# Patient Record
Sex: Female | Born: 1950 | Race: White | Hispanic: No | Smoking: Never smoker
Health system: Southern US, Community
[De-identification: ages and names within clinical notes are randomized; demographics above are authoritative.]

## PROBLEM LIST (undated history)

## (undated) DIAGNOSIS — F329 Major depressive disorder, single episode, unspecified: Secondary | ICD-10-CM

## (undated) DIAGNOSIS — IMO0001 Reserved for inherently not codable concepts without codable children: Secondary | ICD-10-CM

## (undated) DIAGNOSIS — H269 Unspecified cataract: Secondary | ICD-10-CM

## (undated) DIAGNOSIS — R351 Nocturia: Secondary | ICD-10-CM

## (undated) DIAGNOSIS — M549 Dorsalgia, unspecified: Secondary | ICD-10-CM

## (undated) DIAGNOSIS — R197 Diarrhea, unspecified: Secondary | ICD-10-CM

## (undated) DIAGNOSIS — G8929 Other chronic pain: Secondary | ICD-10-CM

## (undated) DIAGNOSIS — O24419 Gestational diabetes mellitus in pregnancy, unspecified control: Secondary | ICD-10-CM

## (undated) DIAGNOSIS — G250 Essential tremor: Secondary | ICD-10-CM

## (undated) DIAGNOSIS — I10 Essential (primary) hypertension: Secondary | ICD-10-CM

## (undated) DIAGNOSIS — K5790 Diverticulosis of intestine, part unspecified, without perforation or abscess without bleeding: Secondary | ICD-10-CM

## (undated) DIAGNOSIS — M2550 Pain in unspecified joint: Secondary | ICD-10-CM

## (undated) DIAGNOSIS — H5702 Anisocoria: Secondary | ICD-10-CM

## (undated) DIAGNOSIS — G4730 Sleep apnea, unspecified: Secondary | ICD-10-CM

## (undated) DIAGNOSIS — M1990 Unspecified osteoarthritis, unspecified site: Secondary | ICD-10-CM

## (undated) DIAGNOSIS — R011 Cardiac murmur, unspecified: Secondary | ICD-10-CM

## (undated) DIAGNOSIS — H35039 Hypertensive retinopathy, unspecified eye: Secondary | ICD-10-CM

## (undated) DIAGNOSIS — H3530 Unspecified macular degeneration: Secondary | ICD-10-CM

## (undated) DIAGNOSIS — E785 Hyperlipidemia, unspecified: Secondary | ICD-10-CM

## (undated) DIAGNOSIS — F32A Depression, unspecified: Secondary | ICD-10-CM

## (undated) DIAGNOSIS — H8110 Benign paroxysmal vertigo, unspecified ear: Secondary | ICD-10-CM

## (undated) DIAGNOSIS — M810 Age-related osteoporosis without current pathological fracture: Secondary | ICD-10-CM

## (undated) DIAGNOSIS — T7840XA Allergy, unspecified, initial encounter: Secondary | ICD-10-CM

## (undated) HISTORY — DX: Anisocoria: H57.02

## (undated) HISTORY — PX: COLONOSCOPY: SHX174

## (undated) HISTORY — DX: Essential (primary) hypertension: I10

## (undated) HISTORY — DX: Sleep apnea, unspecified: G47.30

## (undated) HISTORY — PX: TONSILLECTOMY: SUR1361

## (undated) HISTORY — DX: Gilbert syndrome: E80.4

## (undated) HISTORY — DX: Allergy, unspecified, initial encounter: T78.40XA

## (undated) HISTORY — DX: Major depressive disorder, single episode, unspecified: F32.9

## (undated) HISTORY — PX: EYE SURGERY: SHX253

## (undated) HISTORY — DX: Unspecified cataract: H26.9

## (undated) HISTORY — DX: Essential tremor: G25.0

## (undated) HISTORY — DX: Hypertensive retinopathy, unspecified eye: H35.039

## (undated) HISTORY — DX: Cardiac murmur, unspecified: R01.1

## (undated) HISTORY — DX: Age-related osteoporosis without current pathological fracture: M81.0

## (undated) HISTORY — DX: Gestational diabetes mellitus in pregnancy, unspecified control: O24.419

## (undated) HISTORY — PX: DILATION AND CURETTAGE OF UTERUS: SHX78

## (undated) HISTORY — DX: Depression, unspecified: F32.A

## (undated) HISTORY — DX: Hyperlipidemia, unspecified: E78.5

## (undated) HISTORY — DX: Benign paroxysmal vertigo, unspecified ear: H81.10

## (undated) HISTORY — PX: PLANTAR FASCIA SURGERY: SHX746

---

## 1998-06-21 ENCOUNTER — Other Ambulatory Visit: Admission: RE | Admit: 1998-06-21 | Discharge: 1998-06-21 | Payer: Self-pay | Admitting: Obstetrics and Gynecology

## 2000-10-16 ENCOUNTER — Other Ambulatory Visit: Admission: RE | Admit: 2000-10-16 | Discharge: 2000-10-16 | Payer: Self-pay | Admitting: *Deleted

## 2001-03-19 ENCOUNTER — Other Ambulatory Visit: Admission: RE | Admit: 2001-03-19 | Discharge: 2001-03-19 | Payer: Self-pay | Admitting: *Deleted

## 2001-04-22 ENCOUNTER — Other Ambulatory Visit: Admission: RE | Admit: 2001-04-22 | Discharge: 2001-04-22 | Payer: Self-pay | Admitting: *Deleted

## 2001-04-22 ENCOUNTER — Encounter (INDEPENDENT_AMBULATORY_CARE_PROVIDER_SITE_OTHER): Payer: Self-pay | Admitting: Specialist

## 2002-07-17 ENCOUNTER — Other Ambulatory Visit: Admission: RE | Admit: 2002-07-17 | Discharge: 2002-07-17 | Payer: Self-pay | Admitting: Obstetrics and Gynecology

## 2002-10-08 ENCOUNTER — Ambulatory Visit (HOSPITAL_COMMUNITY): Admission: RE | Admit: 2002-10-08 | Discharge: 2002-10-08 | Payer: Self-pay | Admitting: Obstetrics and Gynecology

## 2002-10-08 ENCOUNTER — Encounter (INDEPENDENT_AMBULATORY_CARE_PROVIDER_SITE_OTHER): Payer: Self-pay | Admitting: Specialist

## 2003-08-24 ENCOUNTER — Other Ambulatory Visit: Admission: RE | Admit: 2003-08-24 | Discharge: 2003-08-24 | Payer: Self-pay | Admitting: Obstetrics and Gynecology

## 2004-01-18 ENCOUNTER — Encounter (INDEPENDENT_AMBULATORY_CARE_PROVIDER_SITE_OTHER): Payer: Self-pay | Admitting: *Deleted

## 2004-05-08 ENCOUNTER — Encounter: Payer: Self-pay | Admitting: Internal Medicine

## 2004-11-10 ENCOUNTER — Ambulatory Visit: Payer: Self-pay | Admitting: Internal Medicine

## 2005-07-11 ENCOUNTER — Ambulatory Visit: Payer: Self-pay | Admitting: Internal Medicine

## 2005-07-17 ENCOUNTER — Ambulatory Visit: Payer: Self-pay

## 2006-07-17 ENCOUNTER — Ambulatory Visit: Payer: Self-pay | Admitting: Internal Medicine

## 2006-09-18 ENCOUNTER — Ambulatory Visit: Payer: Self-pay | Admitting: Internal Medicine

## 2006-10-09 ENCOUNTER — Ambulatory Visit: Payer: Self-pay | Admitting: Internal Medicine

## 2006-10-14 ENCOUNTER — Ambulatory Visit: Payer: Self-pay | Admitting: Internal Medicine

## 2006-10-21 ENCOUNTER — Ambulatory Visit: Payer: Self-pay | Admitting: Internal Medicine

## 2006-11-18 ENCOUNTER — Ambulatory Visit: Payer: Self-pay | Admitting: Internal Medicine

## 2006-11-18 LAB — CONVERTED CEMR LAB
ALT: 31 units/L (ref 0–40)
AST: 30 units/L (ref 0–37)
Chol/HDL Ratio, serum: 2.7
HDL: 67.1 mg/dL (ref >39.0)
Hgb A1c MFr Bld: 5.9 % (ref 4.6–6.0)
LDL Cholesterol: 94 mg/dL (ref 0–99)
VLDL: 21 mg/dL (ref 0–40)

## 2007-09-09 ENCOUNTER — Ambulatory Visit: Payer: Self-pay | Admitting: Internal Medicine

## 2007-09-09 DIAGNOSIS — E785 Hyperlipidemia, unspecified: Secondary | ICD-10-CM

## 2007-09-09 DIAGNOSIS — E8881 Metabolic syndrome: Secondary | ICD-10-CM | POA: Insufficient documentation

## 2007-09-09 DIAGNOSIS — F329 Major depressive disorder, single episode, unspecified: Secondary | ICD-10-CM

## 2007-09-09 DIAGNOSIS — F32A Depression, unspecified: Secondary | ICD-10-CM | POA: Insufficient documentation

## 2007-09-09 DIAGNOSIS — I10 Essential (primary) hypertension: Secondary | ICD-10-CM

## 2007-09-09 LAB — CONVERTED CEMR LAB
Cholesterol, target level: 200 mg/dL
HDL goal, serum: 40 mg/dL
LDL Goal: 160 mg/dL

## 2007-09-19 ENCOUNTER — Ambulatory Visit: Payer: Self-pay | Admitting: Internal Medicine

## 2007-09-22 ENCOUNTER — Encounter (INDEPENDENT_AMBULATORY_CARE_PROVIDER_SITE_OTHER): Payer: Self-pay | Admitting: *Deleted

## 2007-09-22 LAB — CONVERTED CEMR LAB
ALT: 23 units/L (ref 0–35)
AST: 21 units/L (ref 0–37)
Albumin: 4 g/dL (ref 3.5–5.2)
Alkaline Phosphatase: 112 units/L (ref 39–117)
Basophils Absolute: 0 10*3/uL (ref 0.0–0.1)
Basophils Relative: 0.1 % (ref 0.0–1.0)
Bilirubin, Direct: 0.1 mg/dL (ref 0.0–0.3)
Cholesterol: 231 mg/dL (ref 0–200)
Creatinine, Ser: 0.7 mg/dL (ref 0.4–1.2)
Direct LDL: 154.8 mg/dL
Eosinophils Absolute: 0.2 10*3/uL (ref 0.0–0.6)
Eosinophils Relative: 4 % (ref 0.0–5.0)
GFR calc non Af Amer: 92 mL/min
HCT: 39 % (ref 36.0–46.0)
Hgb A1c MFr Bld: 6.1 % — ABNORMAL HIGH (ref 4.6–6.0)
MCHC: 33.9 g/dL (ref 30.0–36.0)
MCV: 84.3 fL (ref 78.0–100.0)
Monocytes Absolute: 0.3 10*3/uL (ref 0.2–0.7)
Monocytes Relative: 5.3 % (ref 3.0–11.0)
Neutro Abs: 4.2 10*3/uL (ref 1.4–7.7)
Platelets: 246 10*3/uL (ref 150–400)
RBC: 4.63 M/uL (ref 3.87–5.11)
Sodium: 145 meq/L (ref 135–145)
TSH: 1.03 microintl units/mL (ref 0.35–5.50)
Total Bilirubin: 0.9 mg/dL (ref 0.3–1.2)
Total CHOL/HDL Ratio: 4.2

## 2008-10-06 ENCOUNTER — Encounter (INDEPENDENT_AMBULATORY_CARE_PROVIDER_SITE_OTHER): Payer: Self-pay | Admitting: *Deleted

## 2009-01-26 ENCOUNTER — Ambulatory Visit: Payer: Self-pay | Admitting: Internal Medicine

## 2009-01-26 LAB — CONVERTED CEMR LAB: Cholesterol, target level: 200 mg/dL

## 2009-01-27 ENCOUNTER — Telehealth (INDEPENDENT_AMBULATORY_CARE_PROVIDER_SITE_OTHER): Payer: Self-pay | Admitting: *Deleted

## 2009-01-27 ENCOUNTER — Encounter: Payer: Self-pay | Admitting: Internal Medicine

## 2009-01-27 LAB — CONVERTED CEMR LAB
Albumin: 4.1 g/dL (ref 3.5–5.2)
Basophils Absolute: 0.1 10*3/uL (ref 0.0–0.1)
Calcium: 9.6 mg/dL (ref 8.4–10.5)
Chloride: 101 meq/L (ref 96–112)
Cholesterol: 263 mg/dL (ref 0–200)
Creatinine, Ser: 0.6 mg/dL (ref 0.4–1.2)
Direct LDL: 181 mg/dL
Eosinophils Absolute: 0.3 10*3/uL (ref 0.0–0.7)
Eosinophils Relative: 3.8 % (ref 0.0–5.0)
Glucose, Bld: 104 mg/dL — ABNORMAL HIGH (ref 70–99)
HCT: 42.3 % (ref 36.0–46.0)
HDL: 65.3 mg/dL (ref >39.0)
Hemoglobin: 14 g/dL (ref 12.0–15.0)
Hgb A1c MFr Bld: 6.3 % — ABNORMAL HIGH (ref 4.6–6.0)
MCHC: 33.1 g/dL (ref 30.0–36.0)
Microalb, Ur: 0.9 mg/dL (ref 0.0–1.9)
Monocytes Absolute: 0.4 10*3/uL (ref 0.1–1.0)
Monocytes Relative: 5.5 % (ref 3.0–12.0)
Potassium: 5.1 meq/L (ref 3.5–5.1)
RBC: 5 M/uL (ref 3.87–5.11)
Sodium: 141 meq/L (ref 135–145)
TSH: 0.9 microintl units/mL (ref 0.35–5.50)
Total Protein: 7.8 g/dL (ref 6.0–8.3)
Triglycerides: 127 mg/dL (ref 0–149)

## 2009-04-08 ENCOUNTER — Ambulatory Visit: Payer: Self-pay | Admitting: Internal Medicine

## 2009-04-10 LAB — CONVERTED CEMR LAB
ALT: 29 units/L (ref 0–35)
Alkaline Phosphatase: 111 units/L (ref 39–117)
Bilirubin, Direct: 0 mg/dL (ref 0.0–0.3)
Cholesterol: 205 mg/dL — ABNORMAL HIGH (ref 0–200)
HDL: 64.7 mg/dL (ref >39.00)
Microalb Creat Ratio: 6.4 mg/g (ref 0.0–30.0)
Microalb, Ur: 0.8 mg/dL (ref 0.0–1.9)
Triglycerides: 145 mg/dL (ref 0.0–149.0)
VLDL: 29 mg/dL (ref 0.0–40.0)

## 2009-04-11 ENCOUNTER — Encounter (INDEPENDENT_AMBULATORY_CARE_PROVIDER_SITE_OTHER): Payer: Self-pay | Admitting: *Deleted

## 2009-04-15 ENCOUNTER — Encounter: Payer: Self-pay | Admitting: Internal Medicine

## 2009-12-05 ENCOUNTER — Encounter (INDEPENDENT_AMBULATORY_CARE_PROVIDER_SITE_OTHER): Payer: Self-pay | Admitting: *Deleted

## 2010-01-21 ENCOUNTER — Encounter: Payer: Self-pay | Admitting: Internal Medicine

## 2010-03-15 ENCOUNTER — Ambulatory Visit: Payer: Self-pay | Admitting: Internal Medicine

## 2010-03-15 DIAGNOSIS — J309 Allergic rhinitis, unspecified: Secondary | ICD-10-CM | POA: Insufficient documentation

## 2010-03-20 LAB — CONVERTED CEMR LAB
Albumin: 4.1 g/dL (ref 3.5–5.2)
Alkaline Phosphatase: 97 units/L (ref 39–117)
Basophils Relative: 0.9 % (ref 0.0–3.0)
Bilirubin, Direct: 0.1 mg/dL (ref 0.0–0.3)
Calcium: 9.2 mg/dL (ref 8.4–10.5)
Chloride: 105 meq/L (ref 96–112)
Creatinine, Ser: 0.6 mg/dL (ref 0.4–1.2)
Glucose, Bld: 102 mg/dL — ABNORMAL HIGH (ref 70–99)
HCT: 38.7 % (ref 36.0–46.0)
HDL: 77.4 mg/dL (ref >39.00)
Hgb A1c MFr Bld: 6.1 % (ref 4.6–6.5)
Lymphs Abs: 1.1 10*3/uL (ref 0.7–4.0)
MCV: 86 fL (ref 78.0–100.0)
Monocytes Absolute: 0.3 10*3/uL (ref 0.1–1.0)
Monocytes Relative: 4.9 % (ref 3.0–12.0)
Neutro Abs: 3.7 10*3/uL (ref 1.4–7.7)
Neutrophils Relative %: 68.8 % (ref 43.0–77.0)
Potassium: 4.6 meq/L (ref 3.5–5.1)
RDW: 14.9 % — ABNORMAL HIGH (ref 11.5–14.6)
Sodium: 142 meq/L (ref 135–145)
Total Bilirubin: 0.6 mg/dL (ref 0.3–1.2)
Triglycerides: 122 mg/dL (ref 0.0–149.0)
VLDL: 24.4 mg/dL (ref 0.0–40.0)

## 2010-04-26 ENCOUNTER — Encounter: Payer: Self-pay | Admitting: Internal Medicine

## 2010-06-19 ENCOUNTER — Encounter: Payer: Self-pay | Admitting: Gastroenterology

## 2010-06-19 ENCOUNTER — Telehealth: Payer: Self-pay | Admitting: Gastroenterology

## 2010-06-23 ENCOUNTER — Encounter (INDEPENDENT_AMBULATORY_CARE_PROVIDER_SITE_OTHER): Payer: Self-pay | Admitting: *Deleted

## 2010-06-26 ENCOUNTER — Ambulatory Visit: Payer: Self-pay | Admitting: Gastroenterology

## 2010-07-10 ENCOUNTER — Ambulatory Visit: Payer: Self-pay | Admitting: Gastroenterology

## 2010-07-10 LAB — HM COLONOSCOPY

## 2010-12-20 ENCOUNTER — Encounter
Admission: RE | Admit: 2010-12-20 | Discharge: 2010-12-20 | Payer: Self-pay | Source: Home / Self Care | Attending: Orthopedic Surgery | Admitting: Orthopedic Surgery

## 2011-01-30 NOTE — Assessment & Plan Note (Signed)
Summary: cpx/ns/kdc   Vital Signs:  Patient profile:   61 year old female Height:      61.75 inches Weight:      245.6 pounds BMI:     45.45 Temp:     98.2 degrees F oral Pulse rate:   67 / minute Resp:     14 per minute BP sitting:   126 / 80  (left arm) Cuff size:   large  Vitals Entered By: Shonna Chock (March 15, 2010 8:25 AM)  CC: CPX with fasting labs , General Medical Evaluation Comments REVIEWED MED LIST, PATIENT AGREED DOSE AND INSTRUCTION CORRECT    CC:  CPX with fasting labs  and General Medical Evaluation.  History of Present Illness: Mrs. Romo is here for a physical; she is experiencing perennial nasal congestion for > 1 year. 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 Eyes:  Denies blurring, double vision, and vision loss-both eyes. ENT:  Complains of nasal congestion and postnasal drainage; denies ear discharge, earache, and sinus pressure. CV:  Denies chest pain or discomfort, difficulty breathing at night, difficulty breathing while lying down, leg cramps with exertion, palpitations, swelling of feet, and swelling of hands; Occasional positional dizziness esp in LLDP in bed; no orthostatic hypotension  symptoms. Resp:  Denies cough, excessive snoring, hypersomnolence, morning headaches, shortness of breath, and sputum productive; Seasonal RAD symptoms with mild wheezing. GI:  Denies abdominal pain, bloody stools, dark tarry stools, and indigestion. GU:  Denies discharge, dysuria, and hematuria. MS:  Complains of joint pain; denies joint redness, joint swelling, low back pain, mid back pain, and thoracic pain; Ankle & R shoulder pain; as needed Meloxicam. Derm:  Complains of lesion(s); denies changes in nail beds, dryness, hair loss, and rash; Scalp lesion X 4 months, enlarging. Neuro:  Denies disturbances in coordination, numbness, and tingling; Intermittent imbalance . Psych:  Denies anxiety, depression, easily angered, easily tearful, and irritability; No issues on Sertraline. Endo:  Denies cold intolerance, excessive hunger, excessive thirst, excessive urination, and heat intolerance. Heme:  Denies abnormal bruising and bleeding. Allergy:  Complains of itching eyes; denies sneezing.  Physical Exam  General:  well-nourished; alert,appropriate and cooperative throughout examination;overweight-appearing.   Head:  Normocephalic and atraumatic without obvious abnormalities. Verrucoid lesion L scalp Eyes:  No corneal or conjunctival inflammation noted. Perrla. Funduscopic exam benign, without hemorrhages, exudates or papilledema.  Ears:  External ear exam shows no significant lesions or deformities.  Otoscopic examination reveals clear canals, tympanic membranes are intact bilaterally without bulging, retraction, inflammation or discharge. Hearing is grossly normal bilaterally. Nose:  External nasal examination shows no deformity or inflammation. Nasal mucosa are pink and moist without lesions or exudates. Mouth:  Oral mucosa and oropharynx without lesions or exudates.  Teeth in good repair. Neck:  No deformities, masses, or tenderness noted. Lungs:  Normal respiratory effort, chest expands  symmetrically. Lungs are clear  to auscultation, no crackles or wheezes. Heart:  normal rate, regular rhythm, no gallop, no rub, no JVD, no HJR, and grade1  /6 systolic murmur.   Abdomen:  Bowel sounds positive,abdomen soft and non-tender without masses, organomegaly or hernias noted. Genitalia:  Dr Rosalio Macadamia Msk:  No deformity or scoliosis noted of thoracic or lumbar spine.   Pulses:  R and L carotid,radial,dorsalis pedis and posterior tibial pulses are full and equal bilaterally Extremities:  No clubbing, cyanosis, edema, or deformity noted with normal full range of motion of all joints.   Neurologic:  alert & oriented X3 and DTRs symmetrical and normal.   Skin:  See HEAD/scalp lesion Cervical Nodes:  No lymphadenopathy noted Axillary Nodes:  No palpable lymphadenopathy Psych:  memory intact for recent and remote, normally interactive, good eye contact, not anxious appearing, and not depressed appearing.     Impression & Recommendations:  Problem # 1:  ROUTINE GENERAL MEDICAL EXAM@HEALTH  CARE FACL (ICD-V70.0)  Orders: EKG w/ Interpretation (93000) Venipuncture (11914) TLB-Lipid Panel (80061-LIPID) TLB-BMP (Basic Metabolic Panel-BMET) (80048-METABOL) TLB-CBC Platelet - w/Differential (85025-CBCD) TLB-Hepatic/Liver Function Pnl (80076-HEPATIC) TLB-TSH (Thyroid Stimulating Hormone) (84443-TSH) TLB-A1C / Hgb A1C (Glycohemoglobin) (83036-A1C)  Problem # 2:  RHINITIS (ICD-477.9)  Orders: Venipuncture (78295)  Her updated medication list for this problem includes:    Fluticasone Propionate 50 Mcg/act Susp (Fluticasone propionate) .Marland Kitchen... 1 spray two times a day  Problem # 3:  HYPERTENSION (ICD-401.9)  controlled Her updated medication list for this problem includes:    Benazepril Hcl 20 Mg Tabs (Benazepril hcl) .Marland Kitchen... Take one tablet daily.    Amlodipine Besylate 5 Mg Tabs (Amlodipine besylate) .Marland Kitchen... Take 1 tab every am  Orders: EKG w/ Interpretation (93000) Venipuncture  (62130)  Problem # 4:  HYPERLIPIDEMIA (ICD-272.4)  Her updated medication list for this problem includes:    Pravastatin Sodium 40 Mg Tabs (Pravastatin sodium) .Marland Kitchen... 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 Take one tablet daily. 2)  Amlodipine Besylate 5 Mg Tabs (Amlodipine besylate) .... Take 1 tab every am 3)  Zoloft 100 Mg Tabs (Sertraline hcl) .... Take one tablet daily 4)  Antivert 12.5 Mg Tabs (Meclizine hcl) .Marland Kitchen.. 1 -2 q 6 hrs prn 5)  Aspirin Adult Low Strength 81 Mg Tbec (Aspirin) .Marland Kitchen.. 1 by mouth m,w,f 6)  Vitamin D 2000 Unit Tabs (Cholecalciferol) .Marland Kitchen.. 1 by mouth once daily 7)  Fish Oil 1200 Mg Caps (Omega-3 fatty acids) .Marland Kitchen.. 1 by mouth once daily 8)  Pravastatin Sodium 40 Mg Tabs (Pravastatin sodium) .Marland Kitchen.. 1 qhs 9)  Fluticasone Propionate 50 Mcg/act Susp (Fluticasone propionate) .Marland Kitchen.. 1 spray two times a day  Patient Instructions: 1)  Flonase two times a day as needed for rhinitis.Less than 40 grams of HFCS "sugar" / day. Prescriptions: FLUTICASONE PROPIONATE 50 MCG/ACT SUSP (FLUTICASONE PROPIONATE) 1 spray two times a day  #1 x prn   Entered and Authorized by:   Marga Melnick MD   Signed by:   Marga Melnick MD on 03/15/2010   Method used:   Historical   RxID:   [id number] PRAVASTATIN SODIUM 40 MG TABS (PRAVASTATIN SODIUM) 1 qhs  #90 Tablet x 3   Entered and Authorized by:   Marga Melnick MD   Signed by:   Marga Melnick MD on 03/15/2010   Method used:  Faxed to ...       [facility] (retail)       [address]       [place], [place]  [address]       Ph: [phone] or [phone]       Fax: [phone]   RxID:   [id number] ZOLOFT 100 MG  TABS (SERTRALINE HCL) TAKE ONE  TABLET DAILY  #90 Tablet x 3   Entered and Authorized by:   Marga Melnick MD   Signed by:   Marga Melnick MD on 03/15/2010   Method used:   Faxed to ...       [facility] (retail)       [address]       [place], [place]  [address]       Ph: [phone] or [phone]       Fax: [phone]   RxID:   [id number] AMLODIPINE BESYLATE 5 MG  TABS (AMLODIPINE BESYLATE) TAKE 1 TAB EVERY AM  #90 Tablet x 3   Entered and Authorized by:   Marga Melnick MD   Signed by:   Marga Melnick MD on 03/15/2010   Method used:   Faxed to ...       [facility] (retail)       [address]       [place], [place]  [address]       Ph: [phone] or [phone]       Fax: [phone]   RxID:   [id number] BENAZEPRIL HCL 20 MG TABS (BENAZEPRIL HCL) Take one tablet daily.  #90 Tablet x 3   Entered and Authorized by:   Marga Melnick MD   Signed by:   Marga Melnick MD on 03/15/2010   Method used:   Faxed to ...       [facility] (retail)       [address]       [place], [place]  [address]       Ph: [phone] or [phone]       Fax: [phone]   RxID:   [id number]

## 2011-01-30 NOTE — Miscellaneous (Signed)
Summary: [facility]  Clinical Lists Changes  Medications: Added new medication of MOVIPREP 100 GM  SOLR (PEG-KCL-NACL-NASULF-NA ASC-C) As per prep instructions. - Signed Rx of MOVIPREP 100 GM  SOLR (PEG-KCL-NACL-NASULF-NA ASC-C) As per prep instructions.;  #1 x 0;  Signed;  Entered by: Ezra Sites RN;  Authorized by: Rachael Fee MD;  Method used: Electronically to [facility] [phone]*, [address] [place], [place], [place]  [address], Ph: [phone] or [phone], Fax: [phone] Observations: Added new observation of NKA: T (06/26/2010 15:40)    Prescriptions: MOVIPREP 100 GM  SOLR (PEG-KCL-NACL-NASULF-NA ASC-C) As per prep instructions.  #1 x 0   Entered by:   Ezra Sites RN   Authorized by:   Rachael Fee MD   Signed by:   Ezra Sites RN on 06/26/2010   Method used:   Electronically to        [facility] (retail)       [address]       [place], [place]  [address]       Ph: [phone] or [phone]       Fax: [phone]   RxID:   [id number]

## 2011-01-30 NOTE — Procedures (Signed)
Summary: Colonoscopy   Colonoscopy  Procedure date:  07/10/2010  Findings:      Location:  [facility].    Procedures Next Due Date:    Colonoscopy: 07/2015

## 2011-01-30 NOTE — Procedures (Signed)
Summary: COLON (Dr Blossom Hoops)   Colonoscopy  Procedure date:  01/18/2004  Findings:      Location:  [facility].    Procedures Next Due Date:    Colonoscopy: 01/2009 Patient Name: Jane Montgomery, Jane Montgomery MRN:  Procedure Procedures: Colonoscopy CPT: 95621.  Personnel: Endoscopist: Ulyess Mort, MD.  Exam Location: Exam performed in Outpatient Clinic. Outpatient  Patient Consent: Procedure, Alternatives, Risks and Benefits discussed, consent obtained, from patient. Consent was obtained by the RN.  Indications  Increased Risk Screening: For family history of colorectal neoplasia, in   History  Current Medications: Patient is not currently taking Coumadin.  Pre-Exam Physical: Entire physical exam was normal.  Exam Exam: Extent of exam reached: Cecum, extent intended: Cecum.  The cecum was identified by appendiceal orifice and IC valve. Colon retroflexion performed. Images were not taken. ASA Classification: II. Tolerance: good.  Monitoring: Pulse and BP monitoring, Oximetry used. Supplemental O2 given.  Colon Prep Prep results: good.  Sedation Meds: Patient assessed and found to be appropriate for moderate (conscious) sedation. Fentanyl 100 mcg. given IV. Versed 10 mg. given IV.  Findings - NOT SEEN ON EXAM: Cecum to Rectum. Polyps, AVM's, Colitis, Tumors, Melanosis, Crohn's, Diverticulosis, Hemorrhoids, Comments: very redundant colon.   Assessment Abnormal examination, see findings above.  Events  Unplanned Interventions: No intervention was required.  Unplanned Events: There were no complications. Plans Medication Plan: Continue current medications.  Patient Education: Patient given standard instructions for: a normal exam. Yearly hemoccult testing recommended. Patient instructed to get routine colonoscopy every 5 years.  Disposition: After procedure patient sent to recovery. After recovery patient sent home.   This report was created from  the original endoscopy report, which was reviewed and signed by the above listed endoscopist.

## 2011-01-30 NOTE — Letter (Signed)
Summary: [facility]   Imported By: Lanelle Bal 05/15/2010 09:08:15  _____________________________________________________________________  External Attachment:    Type:   Image     Comment:   External Document

## 2011-01-30 NOTE — Progress Notes (Signed)
Summary: Schedule Recall Colonosocpy  Phone Note Outgoing Call   Call placed by: Lamona Curl CMA Duncan Dull),  June 19, 2010 4:00 PM Call placed to: Patient Summary of Call: Patient is overdue for colonoscopy. Her last colonoscopy was in 2005 by Dr Victorino Dike and showed a very redundant colon. She also has a very strong family history of colon cancer in her mother, maternal grandmother, Maternal aunt, 3 maternal great aunts. I have spoken to patient who has scheduled a previsit and colonoscopy in July 2011with Dr Christella Hartigan. Initial call taken by: Lamona Curl CMA Duncan Dull),  June 19, 2010 4:09 PM

## 2011-01-30 NOTE — Letter (Signed)
Summary: Previsit letter  [facility]  [address] [place], [place] [address]   Phone: [phone]  Fax: [phone]       06/19/2010 MRN: [id number]  [facility] [address] [place], [place]  [address]  Dear Jane Montgomery,  Welcome to the Gastroenterology Division at [facility].    You are scheduled to see a nurse for your pre-procedure visit on Monday 06/26/10 at 4:00 pm on the 3rd floor at [facility], [address]. We ask that you try to arrive at our office 15 minutes prior to your appointment time to allow for check-in.  Your nurse visit will consist of discussing your medical and surgical history, your immediate family medical history, and your medications.    Please bring a complete list of all your medications or, if you prefer, bring the medication bottles and we will list them.  We will need to be aware of both prescribed and over the counter drugs.  We will need to know exact dosage information as well.  If you are on blood thinners (Coumadin, Plavix, Aggrenox, Ticlid, etc.) please call our office today/prior to your appointment, as we need to consult with your physician about holding your medication.   Please be prepared to read and sign documents such as consent forms, a financial agreement, and acknowledgement forms.  If necessary, and with your consent, a friend or relative is welcome to sit-in on the nurse visit with you.  Please bring your insurance card so that we may make a copy of it.  If your insurance requires a referral to see a specialist, please bring your referral form from your primary care physician.  No co-pay is required for this nurse visit.     If you cannot keep your appointment, please call [phone] to cancel or reschedule prior to your appointment date.  This allows [place] the opportunity to schedule an appointment for another patient in need of care.    Thank you for choosing [facility] for your medical  needs.  We appreciate the opportunity to care for you.  Please visit [place] at our website  to learn more about our practice.                     Sincerely.                                                                                                                   The Gastroenterology Division

## 2011-01-30 NOTE — Letter (Signed)
Summary: [facility] Instructions  [facility]  [address] [place], [place] [address]   Phone: [phone]  Fax: [phone]       Jane Montgomery    20-Apr-1950    MRN: [id number]        Procedure Day /Date: Monday 07/10/10     Arrival Time: 1:30 p.m.     Procedure Time: 2:30 p.m.     Location of Procedure:                    _x_  [facility] (4th Floor)   PREPARATION FOR COLONOSCOPY WITH MOVIPREP   Starting 5 days prior to your procedure  Wed. 07/05/10 do not eat nuts, seeds, popcorn, corn, beans, peas,  salads, or any raw vegetables.  Do not take any fiber supplements (e.g. Metamucil, Citrucel, and Benefiber).  THE DAY BEFORE YOUR PROCEDURE         DATE:  07/09/10  DAY:  Sunday  1.  Drink clear liquids the entire day-NO SOLID FOOD  2.  Do not drink anything colored red or purple.  Avoid juices with pulp.  No orange juice.  3.  Drink at least 64 oz. (8 glasses) of fluid/clear liquids during the day to prevent dehydration and help the prep work efficiently.  CLEAR LIQUIDS INCLUDE: Water Jello Ice Popsicles Tea (sugar ok, no milk/cream) Powdered fruit flavored drinks Coffee (sugar ok, no milk/cream) Gatorade Juice: apple, white grape, white cranberry  Lemonade Clear bullion, consomm, broth Carbonated beverages (any kind) Strained chicken noodle soup Hard Candy                             4.  In the morning, mix first dose of MoviPrep solution:    Empty 1 Pouch A and 1 Pouch B into the disposable container    Add lukewarm drinking water to the top line of the container. Mix to dissolve    Refrigerate (mixed solution should be used within 24 hrs)  5.  Begin drinking the prep at 5:00 p.m. The MoviPrep container is divided by 4 marks.   Every 15 minutes drink the solution down to the next mark (approximately 8 oz) until the full liter is complete.   6.  Follow completed prep with 16 oz of clear liquid of your choice (Nothing red or purple).   Continue to drink clear liquids until bedtime.  7.  Before going to bed, mix second dose of MoviPrep solution:    Empty 1 Pouch A and 1 Pouch B into the disposable container    Add lukewarm drinking water to the top line of the container. Mix to dissolve    Refrigerate  THE DAY OF YOUR PROCEDURE      DATE:  07/10/10  DAY: Monday  Beginning at  9:30 a.m. (5 hours before procedure):         1. Every 15 minutes, drink the solution down to the next mark (approx 8 oz) until the full liter is complete.  2. Follow completed prep with 16 oz. of clear liquid of your choice.    3. You may drink clear liquids until  12:30 p.m.  (2 HOURS BEFORE PROCEDURE).   MEDICATION INSTRUCTIONS  Unless otherwise instructed, you should take regular prescription medications with a small sip of water   as early as possible the morning of your procedure.           OTHER  INSTRUCTIONS  You will need a responsible adult at least 59 years of age to accompany you and drive you home.   This person must remain in the waiting room during your procedure.  Wear loose fitting clothing that is easily removed.  Leave jewelry and other valuables at home.  However, you may wish to bring a book to read or  an iPod/MP3 player to listen to music as you wait for your procedure to start.  Remove all body piercing jewelry and leave at home.  Total time from sign-in until discharge is approximately 2-3 hours.  You should go home directly after your procedure and rest.  You can resume normal activities the  day after your procedure.  The day of your procedure you should not:   Drive   Make legal decisions   Operate machinery   Drink alcohol   Return to work  You will receive specific instructions about eating, activities and medications before you leave.    The above instructions have been reviewed and explained to me by   Ezra Sites RN  June 26, 2010 4:10 PM     I fully understand and can  verbalize these instructions _____________________________ Date _________

## 2011-01-30 NOTE — Procedures (Signed)
Summary: Colonoscopy  Patient: Jane Montgomery Note: All result statuses are Final unless otherwise noted.  Tests: (1) Colonoscopy (COL)   COL Colonoscopy           DONE     [facility]     [address]     [place], [place]  [address]           COLONOSCOPY PROCEDURE REPORT           PATIENT:  Jane Montgomery, Jane Montgomery  MR#:  [id number]     BIRTHDATE:  July 10, 1950, 60 yrs. old  GENDER:  female     ENDOSCOPIST:  Rachael Fee, MD     PROCEDURE DATE:  07/10/2010     PROCEDURE:  Diagnostic Colonoscopy     ASA CLASS:  Class II     INDICATIONS:  Elevated Risk Screening, family history of colon     cancer (mother and others)     MEDICATIONS:   Fentanyl 100 mcg IV, Versed 10 mg IV           DESCRIPTION OF PROCEDURE:   After the risks benefits and     alternatives of the procedure were thoroughly explained, informed     consent was obtained.  Digital rectal exam was performed and     revealed no rectal masses.   The LB [id number] endoscope     was introduced through the anus and advanced to the cecum, which     was identified by both the appendix and ileocecal valve, without     limitations.  The quality of the prep was good, using MoviPrep.     The instrument was then slowly withdrawn as the colon was fully     examined.     <<PROCEDUREIMAGES>>     FINDINGS:  Mild diverticulosis was found in the sigmoid to     descending colon segments (see image1).  This was otherwise a     normal examination of the colon (see image2, image3, and image4).     Retroflexed views in the rectum revealed no abnormalities.    The     scope was then withdrawn from the patient and the procedure     completed.           COMPLICATIONS:  None           ENDOSCOPIC IMPRESSION:     1) Mild diverticulosis in the sigmoid to descending colon     segments     2) Otherwise normal examination, no polyps or cancers           RECOMMENDATIONS:     1) Given your significant family history of colon cancer, you     should have a repeat colonoscopy in 5 years           REPEAT EXAM:  5 years           ______________________________     Rachael Fee, MD           cc: Marga Melnick, MD           n.     Rosalie Doctor:   Rachael Fee at 07/10/2010 02:49 PM           April Manson, [id number]  Note: An exclamation mark (!) indicates a result that was not dispersed into the flowsheet. Document Creation Date: 07/10/2010 2:50 PM _______________________________________________________________________  (1) Order result status: Final Collection or observation date-time: 07/10/2010 14:46 Requested date-time:  Receipt date-time:  Reported date-time:  Referring Physician:   Ordering Physician: Rob Bunting [phone]) Specimen Source:  Source: Launa Grill Order Number: [id number] Lab site:

## 2011-03-10 ENCOUNTER — Encounter: Payer: Self-pay | Admitting: Internal Medicine

## 2011-03-27 ENCOUNTER — Other Ambulatory Visit: Payer: Self-pay | Admitting: Internal Medicine

## 2011-04-20 ENCOUNTER — Other Ambulatory Visit: Payer: Self-pay | Admitting: Internal Medicine

## 2011-04-25 ENCOUNTER — Other Ambulatory Visit: Payer: Self-pay | Admitting: Internal Medicine

## 2011-05-18 NOTE — Op Note (Signed)
NAME:  Jane Montgomery, Jane Montgomery                          ACCOUNT NO.:  [id number]   MEDICAL RECORD NO.:  [id number]                   PATIENT TYPE:  AMB   LOCATION:  SDC                                  FACILITY:  [facility]   PHYSICIAN:  Sherry A. Rosalio Macadamia, M.D.           DATE OF BIRTH:  Oct 05, 1950   DATE OF PROCEDURE:  10/08/2002  DATE OF DISCHARGE:                                 OPERATIVE REPORT   PREOPERATIVE DIAGNOSES:  Simple endometrial hyperplasia.   POSTOPERATIVE DIAGNOSES:  Simple endometrial hyperplasia.   PROCEDURE:  1. Dilatation and curettage.  2. Hysteroscope with resectoscope.   SURGEON:  Sherry A. Rosalio Macadamia, M.D.   ANESTHESIA:  MAC.   INDICATIONS:  This is a 61 year old G2, P2-0-0-2 woman who has had a history  of endometrial hyperplasia.  The patient had been followed previously by Dr.  Roslyn Smiling.  The patient had an endometrial biopsy in July which showed  disordered proliferative endometrium with focal simple hyperplasia.  The  patient had been treated for a year and a half with Provera.  However,  because of the persistent hyperplasia, sonohysterogram was performed.  Sonohysterogram did not reveal a distinct polyp; however, the endometrial  walls were thickened including 0.9 cm of the posterior wall with a possible  artifact or polyp present at the fundus.  Because of this, the patient is  brought to the operating room for D&C/hysteroscopy with resectoscope.   FINDINGS:  Normal sized anteflexed uterus.  No adnexal mass.  Very  disordered endometrial tissue.   PROCEDURE:  The patient was brought into the operating room after an  adequate IV sedation.  She was placed in a dorsal lithotomy position.  Her  perineum and vagina were washed with Hibiclens.  Pelvic examination was  performed.  The patient was draped in a sterile fashion.  Speculum was  placed into the vagina.  Vagina was washed with Hibiclens.  Paracervical  block was administered with 1% Nesacaine.  Anterior  lip of the cervix was  grasped with a single tooth tenaculum.  Cervix was sounded.  Cervix was  dilated with Pratt dilators to a number 31.  Hysteroscope was introduced  into the endometrial cavity.  Pictures were obtained.  Using double loop  right angle resector the irregular tissue was initially removed then samples  of endometrial tissue were taken circumferentially throughout the uterus.  Small bleeders were cauterized.  Adequate hemostasis was present.  Picture  was also obtained at the end of procedure.  All instruments were removed  from vagina with adequate hemostasis.  The patient was taken out of the  dorsal lithotomy position.  She was awakened.  She was moved from the  operating table to a stretcher in stable condition.   COMPLICATIONS:  None.    ESTIMATED BLOOD LOSS:  Less than 5 cc.   FLUIDS:  Sorbitol differential -10 cc.  Sherry A. Rosalio Macadamia, M.D.    SAD/MEDQ  D:  10/08/2002  T:  10/08/2002  Job:  [id number]

## 2011-05-26 ENCOUNTER — Other Ambulatory Visit: Payer: Self-pay | Admitting: Internal Medicine

## 2011-06-23 ENCOUNTER — Encounter: Payer: Self-pay | Admitting: Family Medicine

## 2011-06-27 ENCOUNTER — Encounter: Payer: Self-pay | Admitting: Internal Medicine

## 2011-06-27 ENCOUNTER — Ambulatory Visit (INDEPENDENT_AMBULATORY_CARE_PROVIDER_SITE_OTHER): Payer: BC Managed Care – PPO | Admitting: Internal Medicine

## 2011-06-27 VITALS — BP 122/80 | HR 70 | Ht 62.0 in | Wt 263.0 lb

## 2011-06-27 DIAGNOSIS — R0609 Other forms of dyspnea: Secondary | ICD-10-CM

## 2011-06-27 DIAGNOSIS — E785 Hyperlipidemia, unspecified: Secondary | ICD-10-CM

## 2011-06-27 DIAGNOSIS — R600 Localized edema: Secondary | ICD-10-CM

## 2011-06-27 DIAGNOSIS — R0683 Snoring: Secondary | ICD-10-CM

## 2011-06-27 DIAGNOSIS — Z0000 Encounter for general adult medical examination without abnormal findings: Secondary | ICD-10-CM

## 2011-06-27 DIAGNOSIS — E8881 Metabolic syndrome: Secondary | ICD-10-CM

## 2011-06-27 DIAGNOSIS — I10 Essential (primary) hypertension: Secondary | ICD-10-CM

## 2011-06-27 DIAGNOSIS — H1010 Acute atopic conjunctivitis, unspecified eye: Secondary | ICD-10-CM

## 2011-06-27 DIAGNOSIS — R609 Edema, unspecified: Secondary | ICD-10-CM

## 2011-06-27 LAB — CBC WITH DIFFERENTIAL/PLATELET
Basophils Relative: 0.6 % (ref 0.0–3.0)
Eosinophils Relative: 2.7 % (ref 0.0–5.0)
HCT: 38.9 % (ref 36.0–46.0)
Lymphocytes Relative: 21 % (ref 12.0–46.0)
MCHC: 33.3 g/dL (ref 30.0–36.0)
MCV: 85 fl (ref 78.0–100.0)
Neutro Abs: 4.3 10*3/uL (ref 1.4–7.7)

## 2011-06-27 LAB — BASIC METABOLIC PANEL
BUN: 17 mg/dL (ref 6–23)
CO2: 31 mEq/L (ref 19–32)
Chloride: 104 mEq/L (ref 96–112)
Glucose, Bld: 104 mg/dL — ABNORMAL HIGH (ref 70–99)
Potassium: 4.6 mEq/L (ref 3.5–5.1)

## 2011-06-27 LAB — LDL CHOLESTEROL, DIRECT: Direct LDL: 109.5 mg/dL

## 2011-06-27 LAB — HEPATIC FUNCTION PANEL
ALT: 32 U/L (ref 0–35)
Alkaline Phosphatase: 96 U/L (ref 39–117)
Bilirubin, Direct: 0.1 mg/dL (ref 0.0–0.3)
Total Bilirubin: 1.1 mg/dL (ref 0.3–1.2)
Total Protein: 6.9 g/dL (ref 6.0–8.3)

## 2011-06-27 LAB — LIPID PANEL
HDL: 66.9 mg/dL (ref >39.00)
Total CHOL/HDL Ratio: 3

## 2011-06-27 LAB — TSH: TSH: 1.25 u[IU]/mL (ref 0.35–5.50)

## 2011-06-27 MED ORDER — LORATADINE 10 MG PO TBDP: 10.0000 mg | ORAL_TABLET | Freq: Every day | ORAL | Status: AC

## 2011-06-27 NOTE — Patient Instructions (Signed)
Preventive Health Care: Exercise  30-45  minutes a day, 3-4 days a week.Do not exercise until Cardiology evaluation has been completed. Walking is especially valuable in preventing Osteoporosis. Eat a low-fat diet with lots of fruits and vegetables, up to 7-9 servings per day. Avoid obesity; your goal = waist less than 35 inches.Consume less than 30 grams of sugar per day from foods & drinks with High Fructose Corn Syrup as #1,2,3 or #4 on label. Follow [employer] low carb program. Health Care Power of Attorney & Living Will place you in charge of your health care  decisions. Verify these are  in place. Meds will be refilled based on lab results @  [facility].

## 2011-06-27 NOTE — Progress Notes (Signed)
Subjective:    Patient ID: Jane Montgomery, female    DOB: [birth year], 61 y.o.   MRN: [id number]  HPI  Jane Montgomery  is here for a physical;acute issues include DOE for 6 months  edema through day for 3 months.      Review of Systems Patient reports no vision/ hearing  changes, adenopathy,fever, weight change,  persistant / recurrent hoarseness , swallowing issues, chest pain,palpitations,edema,persistant /recurrent cough, hemoptysis, dyspnea @  Rest or paroxysmal nocturnal), gastrointestinal bleeding(melena, rectal bleeding), abdominal pain, significant heartburn,  bowel changes,GU symptoms(dysuria, hematuria,pyuria ), Gyn symptoms(abnormal  bleeding , pain),  syncope, focal weakness, memory loss, tingling, skin/hair /nail changes,abnormal bruising or bleeding, anxiety,or depression.  She has perennial itchy, watery eyes & sneezing. Snoring w/o definite apnea reported. She has knee & ankle  arthralgias  Treated with NSAIDS in evening. Chronic stress (cough/sneeze) incontinence.     Objective:   Physical Exam Gen.: Weight excess. Alert, appropriate and cooperative throughout exam. Head: Normocephalic without obvious abnormalities. Eyes: No corneal or conjunctival inflammation noted. Pupils equal round reactive to light and accommodation. Fundal exam is benign without hemorrhages, exudate, papilledema. Extraocular motion intact. Vision grossly normal. Ears: External  ear exam reveals no significant lesions or deformities. Canals clear .TMs normal. Hearing is grossly normal bilaterally. Nose: External nasal exam reveals no deformity or inflammation. Nasal mucosa are pink and moist. No lesions or exudates noted.  Mouth: Oral mucosa and oropharynx reveal no lesions or exudates. Teeth in good repair. Neck: No deformities, masses, or tenderness noted. Range of motion &. Thyroid  normal. Lungs: Normal respiratory effort; chest expands symmetrically. Lungs are clear to auscultation without rales, wheezes,  or increased work of breathing. Heart: Normal rate and rhythm. Normal S1 and S2. No gallop, click, or rub. Grade 1/6 systolic  murmur. Abdomen: Bowel sounds normal; abdomen soft and nontender. No masses, organomegaly or hernias noted. Genitalia: Dr Particia Jasper group                                                                                      Musculoskeletal/extremities: No deformity or scoliosis noted of  the thoracic or lumbar spine. No clubbing, cyanosis,  or deformity noted.Non pitting ankle edema. Range of motion  normal .Tone & strength  normal.Joints normal. Nail health  Good.Mild valgus knee changes Vascular: Carotid, radial artery, dorsalis pedis and posterior tibial pulses are full and equal. No bruits present. Neurologic: Alert and oriented x3. Deep tendon reflexes symmetrical and normal.         Skin: Intact without suspicious lesions or rashes. Lymph: No cervical, axillary, or inguinal lymphadenopathy present. Psych: Mood and affect are normal. Normally interactive  Assessment & Plan:  #1 comprehensive physical exam; no acute findings #2 see Problem List with Assessments & Recommendations #3 DOE & edema, R/O cardiovascular etiology #4 snoring , R/O Sleep Apnea Plan: see Orders

## 2011-06-28 ENCOUNTER — Encounter: Payer: Self-pay | Admitting: Internal Medicine

## 2011-06-30 ENCOUNTER — Other Ambulatory Visit: Payer: Self-pay | Admitting: Internal Medicine

## 2011-07-01 ENCOUNTER — Encounter: Payer: Self-pay | Admitting: Internal Medicine

## 2011-07-25 ENCOUNTER — Ambulatory Visit (INDEPENDENT_AMBULATORY_CARE_PROVIDER_SITE_OTHER): Payer: BC Managed Care – PPO | Admitting: Cardiovascular Disease

## 2011-07-25 ENCOUNTER — Encounter: Payer: Self-pay | Admitting: Cardiovascular Disease

## 2011-07-25 DIAGNOSIS — R609 Edema, unspecified: Secondary | ICD-10-CM

## 2011-07-25 DIAGNOSIS — E669 Obesity, unspecified: Secondary | ICD-10-CM

## 2011-07-25 DIAGNOSIS — R011 Cardiac murmur, unspecified: Secondary | ICD-10-CM | POA: Insufficient documentation

## 2011-07-25 DIAGNOSIS — R0600 Dyspnea, unspecified: Secondary | ICD-10-CM

## 2011-07-25 MED ORDER — HYDROCHLOROTHIAZIDE 12.5 MG PO CAPS: ORAL_CAPSULE | ORAL | Status: DC

## 2011-07-25 NOTE — Assessment & Plan Note (Signed)
Cholesterol is at goal.  Continue current dose of statin and diet Rx.  No myalgias or side effects.  F/U  LFT's in 6 months. Lab Results  Component Value Date   LDLCALC 94 11/18/2006

## 2011-07-25 NOTE — Assessment & Plan Note (Signed)
Dependant. Lower salt intake.  PRN HCTZ called into [facility].  Check LE venous duplex

## 2011-07-25 NOTE — Assessment & Plan Note (Signed)
Benign SEM Check echo in setting of dyspnea

## 2011-07-25 NOTE — Assessment & Plan Note (Signed)
Functional no evidence of structural heart issues.  Refer to nutritionist for low carb education.  Check echo for EF and PA pressures as well as eval of SEM

## 2011-07-25 NOTE — Assessment & Plan Note (Signed)
Well controlled.  Continue current medications and low sodium Dash type diet.    

## 2011-07-25 NOTE — Patient Instructions (Signed)
Your physician recommends that you schedule a follow-up appointment in: AS NEEDED  Your physician has recommended you make the following change in your medication: HYDROCHLOROTHIAZIDE 12.5 MG  AS NEEDED FOR SWELLING Your physician has requested that you have an echocardiogram. Echocardiography is a painless test that uses sound waves to create images of your heart. It provides your doctor with information about the size and shape of your heart and how well your heart's chambers and valves are working. This procedure takes approximately one hour. There are no restrictions for this procedure. PT'S CONVENIENCE  DX MURMUR Your physician has requested that you have a lower or upper extremity venous duplex. This test is an ultrasound of the veins in the legs or arms. It looks at venous blood flow that carries blood from the heart to the legs or arms. Allow one hour for a Lower Venous exam. Allow thirty minutes for an Upper Venous exam. There are no restrictions or special instructions.  PT'S CONVENIENCE  DX EDEMA  You have been referred to DIETICIAN   NEEDS PRE DIABETIC AND LOW CARB

## 2011-07-25 NOTE — Progress Notes (Signed)
Addended by: Scherrie Bateman E on: 07/25/2011 10:55 AM   Modules accepted: Orders

## 2011-07-25 NOTE — Progress Notes (Signed)
61 yo obese referred by [facility] for exertional dyspnea.  More of a chronic issue.  Sedentary and overweight.  Poor diet with too much bread and carbs.  Recent lab work reviewed and normal including cholesterol and TSH.  Worse LE edema last few months.  Dependant with no history of varicosities or DVT.  CRF elevated lipids HTN and family history with father mi age 60.  Long standing history of murmur.  No SSCP, palpitations or syncope.  Nonsmoker.  No cough, wheezing asthma or occupational exposure.  Has worked at [employer] for 11 years.  Compliant with meds.  Dyspnea is exertional not at rest.    ROS: Denies fever, malais, weight loss, blurry vision, decreased visual acuity, cough, sputum, SOB, hemoptysis, pleuritic pain, palpitaitons, heartburn, abdominal pain, melena, lower extremity edema, claudication, or rash.  All other systems reviewed and negative   General: Affect appropriate Healthy:  appears stated age HEENT: normal Neck supple with no adenopathy JVP normal no bruits no thyromegaly Lungs clear with no wheezing and good diaphragmatic motion Heart:  S1/S2 no murmur,rub, gallop or click PMI normal Abdomen: benighn, BS positve, no tenderness, no AAA no bruit.  No HSM or HJR Distal pulses intact with no bruits No edema Neuro non-focal Skin warm and dry No muscular weakness  Medications Current Outpatient Prescriptions  Medication Sig Dispense Refill  . acetaminophen (TYLENOL) 650 MG CR tablet Take 650 mg by mouth every 8 (eight) hours as needed.        Marland Kitchen amLODipine (NORVASC) 5 MG tablet TAKE ONE (1) TABLET(S) EVERY MORNING  30 tablet  11  . aspirin 81 MG tablet Take 81 mg by mouth daily.        . benazepril (LOTENSIN) 20 MG tablet TAKE ONE (1) TABLET(S) ONCE DAILY  30 tablet  1  . Ergocalciferol (VITAMIN D2) 2000 UNITS TABS Take 1 tablet by mouth daily.        . fluticasone (FLONASE) 50 MCG/ACT nasal spray Place 1 spray into the nose daily.        Marland Kitchen loratadine (CLARITIN REDITABS) 10 MG  dissolvable tablet Take 1 tablet (10 mg total) by mouth daily.  90 tablet  3  . Multiple Vitamins-Minerals (ICAPS PO) Take by mouth daily.        . pravastatin (PRAVACHOL) 40 MG tablet TAKE ONE (1) TABLET(S) AT BEDTIME  90 tablet  0  . sertraline (ZOLOFT) 100 MG tablet TAKE ONE TABLET BY MOUTH ONE TIME DAILY  30 tablet  1    Allergies Review of patient's allergies indicates no known allergies.  Family History: Family History  Problem Relation Age of Onset  . Hypertension Father   . Coronary artery disease Father     CABG  . Colon cancer Mother   . Macular degeneration Mother   . Hypertension Mother   . Seizures Mother   . Stroke Mother 8  . Osteoporosis Mother   . Myelodysplastic syndrome Mother   . Colon cancer Maternal Grandmother   . Colon cancer Maternal Aunt   . Colon cancer      2 Maternal Great Aunts  . Heart attack Paternal Grandfather     > 55  . Heart attack Paternal Aunt 48  . Leukemia Paternal Grandmother   . Heart attack Father 5    CABG     Social History: History   Social History  . Marital Status: Married    Spouse Name: N/A    Number of Children: N/A  . Years  of Education: N/A   Occupational History  . Not on file.   Social History Main Topics  . Smoking status: Never Smoker   . Smokeless tobacco: Never Used  . Alcohol Use: No  . Drug Use: No  . Sexually Active: Not on file   Other Topics Concern  . Not on file   Social History Narrative  . No narrative on file    Electrocardiogram:  NSR nonspecific T wave changes essentially normal  06/27/11  Assessment and Plan

## 2011-07-28 ENCOUNTER — Other Ambulatory Visit: Payer: Self-pay | Admitting: Internal Medicine

## 2011-08-05 ENCOUNTER — Other Ambulatory Visit: Payer: Self-pay | Admitting: Internal Medicine

## 2011-08-06 NOTE — Telephone Encounter (Signed)
See last lab append for lab order for 10/2011

## 2011-08-14 ENCOUNTER — Encounter: Payer: Self-pay | Admitting: *Deleted

## 2011-08-22 ENCOUNTER — Encounter (INDEPENDENT_AMBULATORY_CARE_PROVIDER_SITE_OTHER): Payer: BC Managed Care – PPO | Admitting: Cardiology

## 2011-08-22 ENCOUNTER — Ambulatory Visit (HOSPITAL_COMMUNITY): Payer: BC Managed Care – PPO | Attending: Cardiovascular Disease | Admitting: Radiology

## 2011-08-22 DIAGNOSIS — E669 Obesity, unspecified: Secondary | ICD-10-CM | POA: Insufficient documentation

## 2011-08-22 DIAGNOSIS — R011 Cardiac murmur, unspecified: Secondary | ICD-10-CM | POA: Insufficient documentation

## 2011-08-22 DIAGNOSIS — I10 Essential (primary) hypertension: Secondary | ICD-10-CM | POA: Insufficient documentation

## 2011-08-22 DIAGNOSIS — R609 Edema, unspecified: Secondary | ICD-10-CM | POA: Insufficient documentation

## 2011-08-22 DIAGNOSIS — R0609 Other forms of dyspnea: Secondary | ICD-10-CM | POA: Insufficient documentation

## 2011-08-22 DIAGNOSIS — R0989 Other specified symptoms and signs involving the circulatory and respiratory systems: Secondary | ICD-10-CM | POA: Insufficient documentation

## 2011-08-22 DIAGNOSIS — I079 Rheumatic tricuspid valve disease, unspecified: Secondary | ICD-10-CM | POA: Insufficient documentation

## 2011-08-22 DIAGNOSIS — E785 Hyperlipidemia, unspecified: Secondary | ICD-10-CM | POA: Insufficient documentation

## 2011-08-23 NOTE — Progress Notes (Signed)
pt aware of results Jane Montgomery  

## 2011-09-12 ENCOUNTER — Encounter: Payer: BC Managed Care – PPO | Attending: Cardiovascular Disease | Admitting: *Deleted

## 2011-09-12 ENCOUNTER — Encounter: Payer: Self-pay | Admitting: *Deleted

## 2011-09-12 DIAGNOSIS — R7309 Other abnormal glucose: Secondary | ICD-10-CM | POA: Insufficient documentation

## 2011-09-12 DIAGNOSIS — E669 Obesity, unspecified: Secondary | ICD-10-CM | POA: Insufficient documentation

## 2011-09-12 DIAGNOSIS — Z713 Dietary counseling and surveillance: Secondary | ICD-10-CM | POA: Insufficient documentation

## 2011-09-12 NOTE — Progress Notes (Signed)
  Medical Nutrition Therapy:  Appt start time: 0900 end time:  1000.  Assessment:  Primary concerns today:  Obesity. Pt with an A1c of 6.5% (06/27/11) here with husband, who also has T2DM. Pt states they eat >90% of their meals out d/t her work schedule.  No structured PA noted, though husband states he bought them bikes and pt agrees to ride 2-3 times/wk. Since MD visit, pt has decreased dietary intake, though continues to consume excessive CHO and sodium daily through meals away from home.   MEDICATIONS:  . acetaminophen (TYLENOL) 650 MG CR tablet  . amLODipine (NORVASC) 5 MG tablet  . aspirin 81 MG tablet  . benazepril (LOTENSIN) 20 MG tablet  . Ergocalciferol (VITAMIN D2) 2000 UNITS TABS  . loratadine (CLARITIN REDITABS) 10 MG dissolvable tablet  . Multiple Vitamins-Minerals (ICAPS PO)  . pravastatin (PRAVACHOL) 40 MG tablet  . sertraline (ZOLOFT) 100 MG tablet  . fluticasone (FLONASE) 50 MCG/ACT nasal spray  . hydrochlorothiazide (,MICROZIDE/HYDRODIURIL,) 12.5 MG capsule    DIETARY INTAKE:  Usual eating pattern includes 2-3 meals and 1-2 snacks per day; >90% of meals consumed away from home.  24-hr recall:  B ( AM): 4 oz flavored Activia; water  Snk ( AM): peanut butter nabs (6 pk)  L ( PM): sandwich, chips; water Snk ( PM): none D ( PM): Eats out Snk ( PM): none  Usual physical activity: None reported at this time.  Estimated Needs: 1200-1300 calories 145-155 g carbohydrates 80-90 g protein 35 g fat (less than 10-12 g as saturated fat) <2000 mg sodium <200-300 mg cholesterol  30 g fiber  Nutritional Diagnosis:  Avalon-2.2 Altered nutrition-related laboratory related to excessive CHO intake and consumption of  >90% of meals away from home as evidenced by an A1c of 6.5% and patient reported 24-hour food recall.    Intervention/Goals:  Follow Diabetes Meal Plan as instructed (see yellow card).  Eat 3 meals and 2 snacks, or every 3-5 hrs;  AVOID meal skipping.  Add lean  protein foods to all meals/snacks.  Decrease meals out each week at a slow to moderate pace.   Limit sodium intake to <2000 mg and cholesterol to <200-300 mg daily.  Aim for >30 mins of moderate physical activity most days of the week - ex: biking, swimming, etc.  Track food intake for one week and bring log to next nutrition visit.   Monitoring/Evaluation:  Dietary intake, exercise, A1c, and body weight TBD.

## 2011-09-12 NOTE — Patient Instructions (Addendum)
Goals:  Follow Diabetes Meal Plan as instructed (see yellow card).  Eat 3 meals and 2 snacks, or every 3-5 hrs;  AVOID meal skipping.  Add lean protein foods to all meals/snacks.  Decrease meals out each week at a slow to moderate pace.   Limit sodium intake to <2000 mg and cholesterol to <200-300 mg daily.  Aim for >30 mins of moderate physical activity most days of the week - ex: biking, swimming, etc.  Track food intake for one week and bring log to next nutrition visit.

## 2011-09-13 ENCOUNTER — Encounter: Payer: Self-pay | Admitting: *Deleted

## 2011-09-29 ENCOUNTER — Other Ambulatory Visit: Payer: Self-pay | Admitting: Internal Medicine

## 2011-11-08 ENCOUNTER — Other Ambulatory Visit: Payer: Self-pay | Admitting: Internal Medicine

## 2011-11-09 NOTE — Telephone Encounter (Signed)
Lipid/Hep 272.4/995.20, A1c 250.00

## 2011-12-14 ENCOUNTER — Other Ambulatory Visit: Payer: Self-pay | Admitting: Internal Medicine

## 2012-01-23 ENCOUNTER — Ambulatory Visit (INDEPENDENT_AMBULATORY_CARE_PROVIDER_SITE_OTHER): Payer: BC Managed Care – PPO | Admitting: *Deleted

## 2012-01-23 DIAGNOSIS — Z23 Encounter for immunization: Secondary | ICD-10-CM

## 2012-03-24 ENCOUNTER — Encounter: Payer: Self-pay | Admitting: Internal Medicine

## 2012-05-28 ENCOUNTER — Other Ambulatory Visit: Payer: Self-pay | Admitting: Internal Medicine

## 2012-05-28 NOTE — Telephone Encounter (Signed)
Patient needs to schedule a CPX June 2013 or after

## 2012-06-28 ENCOUNTER — Other Ambulatory Visit: Payer: Self-pay | Admitting: Internal Medicine

## 2012-07-28 ENCOUNTER — Other Ambulatory Visit: Payer: Self-pay | Admitting: Internal Medicine

## 2012-08-13 ENCOUNTER — Ambulatory Visit (INDEPENDENT_AMBULATORY_CARE_PROVIDER_SITE_OTHER): Payer: BC Managed Care – PPO | Admitting: Internal Medicine

## 2012-08-13 ENCOUNTER — Encounter: Payer: Self-pay | Admitting: Internal Medicine

## 2012-08-13 VITALS — BP 122/76 | HR 69 | Temp 98.4°F | Resp 12 | Ht 62.05 in | Wt 249.8 lb

## 2012-08-13 DIAGNOSIS — Z23 Encounter for immunization: Secondary | ICD-10-CM

## 2012-08-13 DIAGNOSIS — Z0000 Encounter for general adult medical examination without abnormal findings: Secondary | ICD-10-CM

## 2012-08-13 LAB — CBC WITH DIFFERENTIAL/PLATELET
Eosinophils Absolute: 0.2 10*3/uL (ref 0.0–0.7)
Eosinophils Relative: 3.7 % (ref 0.0–5.0)
HCT: 41.6 % (ref 36.0–46.0)
Lymphs Abs: 1.4 10*3/uL (ref 0.7–4.0)
MCHC: 32.3 g/dL (ref 30.0–36.0)
MCV: 85 fl (ref 78.0–100.0)
Monocytes Absolute: 0.3 10*3/uL (ref 0.1–1.0)
Platelets: 212 10*3/uL (ref 150.0–400.0)
RDW: 14.9 % — ABNORMAL HIGH (ref 11.5–14.6)
WBC: 6.1 10*3/uL (ref 4.5–10.5)

## 2012-08-13 LAB — BASIC METABOLIC PANEL
BUN: 16 mg/dL (ref 6–23)
CO2: 29 mEq/L (ref 19–32)
Chloride: 100 mEq/L (ref 96–112)
Glucose, Bld: 99 mg/dL (ref 70–99)
Potassium: 4 mEq/L (ref 3.5–5.1)

## 2012-08-13 LAB — LIPID PANEL
Cholesterol: 196 mg/dL (ref 0–200)
LDL Cholesterol: 97 mg/dL (ref 0–99)
Triglycerides: 161 mg/dL — ABNORMAL HIGH (ref 0.0–149.0)

## 2012-08-13 LAB — HEPATIC FUNCTION PANEL
ALT: 24 U/L (ref 0–35)
Total Bilirubin: 1 mg/dL (ref 0.3–1.2)

## 2012-08-13 NOTE — Progress Notes (Signed)
  Subjective:    Patient ID: Jane Montgomery, female    DOB: Jul 23, 1950, 62 y.o.   MRN: [id number]  HPI    Review of Systems HYPERTENSION: Disease Monitoring: Blood pressure range-not monitored Chest pain, palpitations- no       Dyspnea- no Medications: Compliance-yes  Lightheadedness,Syncope- dizzy only in LLDP    Edema-resolved  FASTING HYPERGLYCEMIA, PMH of:  Disease Monitoring: Blood Sugar ranges-no monitor  Polyuria/phagia/dipsia- no      Visual problems-no  HYPERLIPIDEMIA: Disease Monitoring: See symptoms for Hypertension Medications: Compliance- yes Abd pain, bowel changes- no   Muscle aches- no but knee pain, R > L. Advil as needed       Objective:   Physical Exam Gen.:  well-nourished in appearance. Alert, appropriate and cooperative throughout exam. Head: Normocephalic without obvious abnormalities  Eyes: No corneal or conjunctival inflammation noted. Pupils equal round reactive to light and accommodation. Fundal exam is benign without hemorrhages, exudate, papilledema. Extraocular motion intact. Vision grossly normal. Ears: External  ear exam reveals no significant lesions or deformities. Canals clear .TMs normal. Hearing is grossly normal bilaterally. Nose: External nasal exam reveals no deformity or inflammation. Nasal mucosa are pink and moist. No lesions or exudates noted.  Mouth: Oral mucosa and oropharynx reveal no lesions or exudates. Teeth in good repair. Neck: No deformities, masses, or tenderness noted. Range of motion & Thyroid normal. Lungs: Normal respiratory effort; chest expands symmetrically. Lungs are clear to auscultation without rales, wheezes, or increased work of breathing. Heart: Normal rate and rhythm. Normal S1 and S2. No gallop, click, or rub. Grade 1/2-1 over 6 systolic murmur @ base Abdomen: Bowel sounds normal; abdomen soft and nontender. No masses, organomegaly or hernias noted. Genitalia: as per Gyn                                             Musculoskeletal/extremities: Mild lordosis noted of  the thoracic  spine. No clubbing, cyanosis, edema, or deformity noted. Range of motion  normal .Tone & strength  Normal.Joints:crepitus of knees , R > L. Nail health  good. Vascular: Carotid, radial artery, dorsalis pedis and  posterior tibial pulses are full and equal. No bruits present. Neurologic: Alert and oriented x3. Deep tendon reflexes symmetrical and normal.          Skin: Intact without suspicious lesions or rashes. Lymph: No cervical, axillary lymphadenopathy present. Psych: Mood and affect are normal. Normally interactive                                                                                         Assessment & Plan:  #1 comprehensive physical exam; no acute findings #2 see Problem List with Assessments & Recommendations Plan: see Orders

## 2012-08-13 NOTE — Patient Instructions (Addendum)
Preventive Health Care: Exercise  30-45  minutes a day, 3-4 days a week. Walking is especially valuable in preventing Osteoporosis. Eat a low-fat diet with lots of fruits and vegetables, up to 7-9 servings per day.  Consume less than 30 grams (preferably ZERO) of sugar per day from foods & drinks with High Fructose Corn Syrup as # 1,2,3 or #4 on label. Health Care Power of Attorney & Living Will place you in charge of your health care  decisions. Verify these are  in place. Blood Pressure Goal  Ideally is an AVERAGE < 135/85. This AVERAGE should be calculated from @ least 5-7 BP readings taken @ different times of day on different days of week. You should not respond to isolated BP readings , but rather the AVERAGE for that week . Go to Web M.D. for information on benign positional vertigo (BPV) . Physical therapy exercises can treat that.  Please try to go on My Chart within the next 24 hours to allow me to release the results directly to you.

## 2012-08-30 ENCOUNTER — Other Ambulatory Visit: Payer: Self-pay | Admitting: Internal Medicine

## 2012-09-17 ENCOUNTER — Encounter: Payer: Self-pay | Admitting: Internal Medicine

## 2012-09-17 ENCOUNTER — Ambulatory Visit (INDEPENDENT_AMBULATORY_CARE_PROVIDER_SITE_OTHER): Payer: BC Managed Care – PPO | Admitting: Internal Medicine

## 2012-09-17 VITALS — BP 130/78 | HR 66 | Temp 98.2°F | Wt 251.8 lb

## 2012-09-17 DIAGNOSIS — Z23 Encounter for immunization: Secondary | ICD-10-CM

## 2012-09-17 DIAGNOSIS — J209 Acute bronchitis, unspecified: Secondary | ICD-10-CM

## 2012-09-17 MED ORDER — HYDROCODONE-HOMATROPINE 5-1.5 MG/5ML PO SYRP: 5.0000 mL | ORAL_SOLUTION | Freq: Four times a day (QID) | ORAL | Status: DC | PRN

## 2012-09-17 MED ORDER — AZITHROMYCIN 250 MG PO TABS: ORAL_TABLET | ORAL | Status: DC

## 2012-09-17 NOTE — Addendum Note (Signed)
Addended by: Maurice Small on: 09/17/2012 12:15 PM   Modules accepted: Orders

## 2012-09-17 NOTE — Patient Instructions (Addendum)
Plain Mucinex for thick secretions ;force NON dairy fluids . Use a Neti pot daily as needed for sinus congestion; going from open side to congested side . Nasal cleansing in the shower as discussed. Make sure that all residual soap is removed to prevent irritation.  Plain Allegra 160 daily as needed for itchy eyes & sneezing.    

## 2012-09-17 NOTE — Progress Notes (Signed)
  Subjective:    Patient ID: Jane Montgomery, female    DOB: 1950-08-17, 62 y.o.   MRN: [id number]  HPI Her symptoms began 2 weeks ago as sore throat associated with rhinitis with clear secretions. Subsequently she developed a cough which has become productive in the last 2 days with yellow sputum. Additionally she developed head congestion. She has had some slight wheezing at times with the cough. DayQuil and NyQuil have been partially beneficial.  She had community-acquired pneumonia several years ago; she did  not have the pneumonia vaccine.    Review of Systems There were no extrinsic symptoms such as itchy, watery eyes or sneezing prior to onset of symptoms. At this time she denies frontal headache, facial pain, nasal purulence, sore throat, dental pain, otic pain or discharge.  She has no associated fever, chills, or sweats.     Objective:   Physical Exam General appearance:well nourished; no acute distress or increased work of breathing is present.  No  lymphadenopathy about the head, neck, or axilla noted.   Eyes: No conjunctival inflammation or lid edema is present.   Ears:  External ear exam shows no significant lesions or deformities.  Otoscopic examination reveals clear canals, tympanic membranes are intact bilaterally without bulging, retraction, inflammation or discharge. Minor scarring versus tiny cerumen flakes over the right tympanic membrane  Nose:  External nasal examination shows no deformity or inflammation. Nasal mucosa are pink and moist without lesions or exudates. No septal dislocation or deviation.No obstruction to airflow.   Oral exam: Dental hygiene is good; lips and gums are healthy appearing.There is no oropharyngeal erythema or exudate noted.   Neck:  No deformities, masses, or tenderness noted.     Heart:  Normal rate and regular rhythm. S1 and S2 normal without gallop, murmur, click, rub or other extra sounds.   Lungs:Chest clear to auscultation; no  wheezes, rhonchi,rales ,or rubs present.No increased work of breathing.    Extremities:  No cyanosis, edema, or clubbing  noted    Skin: Warm & dry .          Assessment & Plan:  #1 acute bronchitis w/o bronchospasm; she has upper respiratory tract congestion without signs of rhinosinusitis Plan: See orders and recommendations

## 2012-10-02 ENCOUNTER — Other Ambulatory Visit: Payer: Self-pay | Admitting: Internal Medicine

## 2013-01-01 ENCOUNTER — Telehealth: Payer: Self-pay | Admitting: Internal Medicine

## 2013-01-01 MED ORDER — AMLODIPINE BESYLATE 5 MG PO TABS: ORAL_TABLET | ORAL | Status: DC

## 2013-01-01 NOTE — Telephone Encounter (Signed)
RX sent

## 2013-01-01 NOTE — Telephone Encounter (Signed)
Refill: Amlodipine besylate 5 mg tab. Take one tablet by mouth in the morning. Qty 30. Last fill 11-29-12

## 2013-02-14 ENCOUNTER — Other Ambulatory Visit: Payer: Self-pay

## 2013-05-19 ENCOUNTER — Encounter: Payer: Self-pay | Admitting: Internal Medicine

## 2013-06-01 ENCOUNTER — Other Ambulatory Visit: Payer: Self-pay | Admitting: Internal Medicine

## 2013-06-07 ENCOUNTER — Other Ambulatory Visit: Payer: Self-pay | Admitting: Internal Medicine

## 2013-07-01 ENCOUNTER — Telehealth: Payer: Self-pay | Admitting: Internal Medicine

## 2013-07-01 MED ORDER — SERTRALINE HCL 100 MG PO TABS: ORAL_TABLET | ORAL | Status: DC

## 2013-07-01 MED ORDER — BENAZEPRIL HCL 20 MG PO TABS: ORAL_TABLET | ORAL | Status: DC

## 2013-07-01 MED ORDER — AMLODIPINE BESYLATE 5 MG PO TABS: ORAL_TABLET | ORAL | Status: DC

## 2013-07-01 NOTE — Telephone Encounter (Signed)
Refill Request; Amlodipine 5mg , Benazepril 20mg , Sertraline 100mg . Patient does not have a pills to last until August appointment.

## 2013-07-01 NOTE — Telephone Encounter (Signed)
Spoke to pharmacy, they did not receive prescriptions sent in in 6.2.14. Re-sent rx to pharmacy.  Refill done.

## 2013-08-19 ENCOUNTER — Encounter: Payer: Self-pay | Admitting: Internal Medicine

## 2013-08-19 ENCOUNTER — Ambulatory Visit (INDEPENDENT_AMBULATORY_CARE_PROVIDER_SITE_OTHER): Payer: 59 | Admitting: Internal Medicine

## 2013-08-19 VITALS — BP 140/90 | HR 68 | Temp 98.1°F | Ht 65.25 in | Wt 260.4 lb

## 2013-08-19 DIAGNOSIS — H8110 Benign paroxysmal vertigo, unspecified ear: Secondary | ICD-10-CM | POA: Insufficient documentation

## 2013-08-19 DIAGNOSIS — M1711 Unilateral primary osteoarthritis, right knee: Secondary | ICD-10-CM

## 2013-08-19 DIAGNOSIS — M1710 Unilateral primary osteoarthritis, unspecified knee: Secondary | ICD-10-CM

## 2013-08-19 DIAGNOSIS — I10 Essential (primary) hypertension: Secondary | ICD-10-CM

## 2013-08-19 DIAGNOSIS — E785 Hyperlipidemia, unspecified: Secondary | ICD-10-CM

## 2013-08-19 DIAGNOSIS — Z0000 Encounter for general adult medical examination without abnormal findings: Secondary | ICD-10-CM

## 2013-08-19 DIAGNOSIS — Z1331 Encounter for screening for depression: Secondary | ICD-10-CM

## 2013-08-19 LAB — BASIC METABOLIC PANEL
CO2: 29 mEq/L (ref 19–32)
Calcium: 9.2 mg/dL (ref 8.4–10.5)
Glucose, Bld: 97 mg/dL (ref 70–99)
Potassium: 3.8 mEq/L (ref 3.5–5.1)
Sodium: 137 mEq/L (ref 135–145)

## 2013-08-19 LAB — TSH: TSH: 0.62 u[IU]/mL (ref 0.35–5.50)

## 2013-08-19 LAB — CBC WITH DIFFERENTIAL/PLATELET
Basophils Absolute: 0 10*3/uL (ref 0.0–0.1)
Basophils Relative: 0.5 % (ref 0.0–3.0)
Eosinophils Absolute: 0.2 10*3/uL (ref 0.0–0.7)
HCT: 39.3 % (ref 36.0–46.0)
Hemoglobin: 13 g/dL (ref 12.0–15.0)
Lymphocytes Relative: 20.8 % (ref 12.0–46.0)
Lymphs Abs: 1.5 10*3/uL (ref 0.7–4.0)
MCHC: 33.1 g/dL (ref 30.0–36.0)
Monocytes Relative: 4.6 % (ref 3.0–12.0)
Neutro Abs: 5.2 10*3/uL (ref 1.4–7.7)
RBC: 4.66 Mil/uL (ref 3.87–5.11)
RDW: 14.8 % — ABNORMAL HIGH (ref 11.5–14.6)

## 2013-08-19 LAB — HEPATIC FUNCTION PANEL
AST: 27 U/L (ref 0–37)
Albumin: 4.2 g/dL (ref 3.5–5.2)
Alkaline Phosphatase: 93 U/L (ref 39–117)
Total Protein: 7.3 g/dL (ref 6.0–8.3)

## 2013-08-19 LAB — LIPID PANEL
Cholesterol: 189 mg/dL (ref 0–200)
LDL Cholesterol: 95 mg/dL (ref 0–99)
VLDL: 32.4 mg/dL (ref 0.0–40.0)

## 2013-08-19 MED ORDER — TRAMADOL HCL 50 MG PO TABS: 50.0000 mg | ORAL_TABLET | Freq: Three times a day (TID) | ORAL | Status: DC | PRN

## 2013-08-19 NOTE — Progress Notes (Signed)
  Subjective:    Patient ID: Jane Montgomery, female    DOB: August 04, 1950, 63 y.o.   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     Review of Systems  She is on a modified heart healthy diet; she is unable to exercise due to knee issues. She denies chest pain, palpitations, or claudication. She has some DOE especially with steps. No PND reported. Family history is negative for premature coronary disease in he mother. Advanced cholesterol testing reveals her LDL goal is less than 100. There is medication compliance with the statin. Significant abdominal symptoms, memory deficit, or myalgias denied. .      Objective:   Physical Exam  Gen.: well-nourished in appearance. Weight excess. Alert, appropriate and cooperative throughout exam.  Head: Normocephalic without obvious abnormalities Eyes: No corneal or conjunctival inflammation noted. Pupils equal round reactive to light and accommodation.  Extraocular motion intact. Vision grossly decreased OD without lenses Ears: External  ear exam reveals no significant lesions or deformities. Canals clear .TMs normal. Hearing is grossly normal bilaterally. Nose: External nasal exam reveals no deformity or inflammation. Nasal mucosa are pink and moist. No lesions or exudates noted.  Mouth: Oral mucosa and oropharynx reveal no lesions or exudates. Teeth in good repair. Neck: No deformities, masses, or tenderness noted. Range of motion & Thyroid normal. Lungs: Normal respiratory effort; chest expands symmetrically. Lungs are clear to auscultation without rales, wheezes, or increased work of breathing. Heart: Normal rate and rhythm. Normal S1 and S2. No gallop, click, or rub. Grade 1/2- 1 over 6 systolic murmur. Abdomen: Bowel sounds normal; abdomen soft and nontender. No masses, organomegaly or hernias noted. Genitalia: As per Gyn                                   Musculoskeletal/extremities:  Accentuated curvature of upper thoracic  Spine.  No clubbing, cyanosis, edema, or significant extremity  deformity noted. Range of motion normal .Tone & strength  Normal. Joints  reveal  Mild flexion 5th DIP changes. Nail health good. Able to lie down & sit up w/o help. Negative SLR bilaterally. Crepitus knees Vascular: Carotid, radial artery, dorsalis pedis and  posterior tibial pulses are full and equal. No bruits present. Neurologic: Alert and oriented x3. Deep tendon reflexes symmetrical and normal (R knee not checked).        Skin: Intact without suspicious lesions or rashes. Lymph: No cervical, axillary lymphadenopathy present. Psych: Mood and affect are normal. Normally interactive                                                                                        Assessment & Plan:  #1 comprehensive physical exam; no acute findings  Plan: see Orders  & Recommendations

## 2013-08-19 NOTE — Patient Instructions (Addendum)
Consider glucosamine sulfate 1500 mg daily for joint symptoms. Take this daily  for 3 months and then leave it off for 2 months. This will rehydrate the cartilages.  If you activate the  My Chart system; lab & Xray results will be released directly  to you as soon as I review & address these through the computer.  If you choose not to sign up for My Chart within 36 hours of labs being drawn; results will be reviewed & interpretation added before being copied & mailed, causing a delay in getting the results to you.If you do not receive that report within 7-10 days ,please call. Additionally you can use this system to gain direct  access to your records  if  out of town or @ an office of a  physician who is not in  the My Chart network.  This improves continuity of care & places you in control of your medical record.

## 2013-09-06 ENCOUNTER — Other Ambulatory Visit: Payer: Self-pay | Admitting: Internal Medicine

## 2013-09-09 NOTE — Telephone Encounter (Signed)
Rx sent to the pharmacy by e-script.//AB/CMA 

## 2013-09-25 ENCOUNTER — Other Ambulatory Visit: Payer: Self-pay | Admitting: Internal Medicine

## 2013-09-25 NOTE — Telephone Encounter (Signed)
Med filled.  

## 2013-11-05 ENCOUNTER — Other Ambulatory Visit: Payer: Self-pay

## 2013-11-28 ENCOUNTER — Other Ambulatory Visit: Payer: Self-pay | Admitting: Internal Medicine

## 2013-11-30 NOTE — Telephone Encounter (Signed)
Sertraline refilled per protocol. 

## 2013-12-27 ENCOUNTER — Other Ambulatory Visit: Payer: Self-pay | Admitting: Internal Medicine

## 2013-12-28 NOTE — Telephone Encounter (Signed)
Benazepril and Amlodipine refilled per protocol. JG//CMA 

## 2013-12-31 HISTORY — PX: CATARACT EXTRACTION: SUR2

## 2014-02-03 ENCOUNTER — Encounter: Payer: Self-pay | Admitting: Internal Medicine

## 2014-02-23 ENCOUNTER — Other Ambulatory Visit: Payer: Self-pay | Admitting: Internal Medicine

## 2014-02-23 DIAGNOSIS — F329 Major depressive disorder, single episode, unspecified: Secondary | ICD-10-CM

## 2014-02-23 DIAGNOSIS — F32A Depression, unspecified: Secondary | ICD-10-CM

## 2014-02-23 NOTE — Telephone Encounter (Signed)
Refill for zoloft sent to [facility]

## 2014-05-26 ENCOUNTER — Other Ambulatory Visit: Payer: Self-pay | Admitting: Internal Medicine

## 2014-05-26 NOTE — Telephone Encounter (Signed)
OK X1 (90) OVBNR

## 2014-05-26 NOTE — Telephone Encounter (Signed)
LAST OFFICE VISIT 08/19/2013

## 2014-06-17 ENCOUNTER — Encounter: Payer: Self-pay | Admitting: Internal Medicine

## 2014-06-25 ENCOUNTER — Other Ambulatory Visit: Payer: Self-pay | Admitting: Internal Medicine

## 2014-08-20 ENCOUNTER — Ambulatory Visit (INDEPENDENT_AMBULATORY_CARE_PROVIDER_SITE_OTHER): Payer: 59 | Admitting: Internal Medicine

## 2014-08-20 ENCOUNTER — Encounter: Payer: Self-pay | Admitting: Internal Medicine

## 2014-08-20 ENCOUNTER — Other Ambulatory Visit (INDEPENDENT_AMBULATORY_CARE_PROVIDER_SITE_OTHER): Payer: 59

## 2014-08-20 VITALS — BP 128/82 | HR 70 | Temp 98.1°F | Ht 62.0 in | Wt 262.2 lb

## 2014-08-20 DIAGNOSIS — E785 Hyperlipidemia, unspecified: Secondary | ICD-10-CM

## 2014-08-20 DIAGNOSIS — I10 Essential (primary) hypertension: Secondary | ICD-10-CM

## 2014-08-20 DIAGNOSIS — J3089 Other allergic rhinitis: Secondary | ICD-10-CM

## 2014-08-20 DIAGNOSIS — J309 Allergic rhinitis, unspecified: Secondary | ICD-10-CM | POA: Insufficient documentation

## 2014-08-20 DIAGNOSIS — Z0000 Encounter for general adult medical examination without abnormal findings: Secondary | ICD-10-CM

## 2014-08-20 LAB — CBC WITH DIFFERENTIAL/PLATELET
BASOS ABS: 0 10*3/uL (ref 0.0–0.1)
Basophils Relative: 0.2 % (ref 0.0–3.0)
EOS ABS: 0.2 10*3/uL (ref 0.0–0.7)
Eosinophils Relative: 3.1 % (ref 0.0–5.0)
HEMATOCRIT: 39.8 % (ref 36.0–46.0)
HEMOGLOBIN: 13 g/dL (ref 12.0–15.0)
LYMPHS ABS: 1.2 10*3/uL (ref 0.7–4.0)
Lymphocytes Relative: 23.1 % (ref 12.0–46.0)
MCHC: 32.7 g/dL (ref 30.0–36.0)
MCV: 84.6 fl (ref 78.0–100.0)
MONO ABS: 0.4 10*3/uL (ref 0.1–1.0)
Monocytes Relative: 8.1 % (ref 3.0–12.0)
NEUTROS ABS: 3.3 10*3/uL (ref 1.4–7.7)
Neutrophils Relative %: 65.5 % (ref 43.0–77.0)
PLATELETS: 200 10*3/uL (ref 150.0–400.0)
RBC: 4.71 Mil/uL (ref 3.87–5.11)
RDW: 16.1 % — AB (ref 11.5–15.5)
WBC: 5.1 10*3/uL (ref 4.0–10.5)

## 2014-08-20 LAB — HEMOGLOBIN A1C: HEMOGLOBIN A1C: 6.2 % (ref 4.6–6.5)

## 2014-08-20 NOTE — Patient Instructions (Signed)
Plain Mucinex (NOT D) for thick secretions ;force NON dairy fluids .   Nasal cleansing in the shower as discussed with lather of mild shampoo.After 10 seconds wash off lather while  exhaling through nostrils. Make sure that all residual soap is removed to prevent irritation.  Flonase OR Nasacort AQ 1 spray in each nostril twice a day as needed. Use the "crossover" technique into opposite nostril spraying toward opposite ear @ 45 degree angle, not straight up into nostril.  Use a Neti pot daily only  as needed for significant sinus congestion; going from open side to congested side . Plain Allegra (NOT D )  160 daily , Loratidine 10 mg , OR Zyrtec 10 mg @ bedtime  as needed for itchy eyes & sneezing.  Your next office appointment will be determined based upon review of your pending labs . Those instructions will be transmitted to you through My Chart  OR  by mail;whichever process is your choice to receive results & recommendations .

## 2014-08-20 NOTE — Progress Notes (Signed)
Subjective:    Patient ID: Jane Montgomery, female    DOB: April 23, 1950, 64 y.o.   MRN: [id number]  HPI  She is here for a physical;acute issues reviewed in ROS.  She is compliant with her cholesterol and antihypertensive medications. She has no adverse effects.  She is on modified heart healthy diet. She does not add salt at the table  She has no active cardiopulmonary symptoms but exercise is limited by her low back and the knee  degenerative disease. She is seeing Dr. Mayer Camel, Orthopedist.   Extrinsic symptoms of itchy, watery eyes & post nasal drainage present since Spring. No sneezing or angioedema . There is minor cough, & wheezing.    Review of Systems   Chest pain, palpitations, tachycardia, exertional dyspnea, paroxysmal nocturnal dyspnea, claudication or edema are absent.  Unexplained weight loss, abdominal pain, significant dyspepsia, dysphagia, melena, rectal bleeding, or persistently small caliber stools are denied. Colonoscopy due 2016 due to + FH.  Vaginal bleeding episode being evaluated by Gyn.     Objective:   Physical Exam    Pertinent positive findings include:  As per CDC Guidelines ,Epic documents severe obesity as being present . Grade 1/2 over 6 systolic murmur Varus deformity of the knees is present. The left knee reflex is decreased. Pes planus is present  Gen.: well-nourished in appearance. Alert, appropriate and cooperative throughout exam. Head: Normocephalic without obvious abnormalities Eyes: No corneal or conjunctival inflammation noted. Pupils equal round reactive to light and accommodation. Extraocular motion intact.  Ears: External  ear exam reveals no significant lesions or deformities. Canals clear .TMs normal. Hearing is grossly normal bilaterally. Nose: External nasal exam reveals no deformity or inflammation. Nasal mucosa are pink and moist. No lesions or exudates noted.   Mouth: Oral mucosa and oropharynx reveal no lesions or exudates.  Teeth in good repair. Neck: No deformities, masses, or tenderness noted. Range of motion & Thyroid normal Lungs: Normal respiratory effort; chest expands symmetrically. Lungs are clear to auscultation without rales, wheezes, or increased work of breathing. Heart: Normal rate and rhythm. Normal S1 and S2. No gallop, click, or rub.  Abdomen: Protuberant.Bowel sounds normal; abdomen soft and nontender. No masses, organomegaly or hernias noted. Genitalia: as per Gyn                                  Musculoskeletal/extremities: No deformity or scoliosis noted of  the thoracic or lumbar spine.  No clubbing, cyanosis, edema, or significant extremity  deformity noted. Range of motion normal .Tone & strength normal.  Hand joints normal  Fingernail  health good. Able to lie down & sit up w/o help. Negative SLR bilaterally Vascular: Carotid, radial artery, dorsalis pedis and  posterior tibial pulses are full and equal. No bruits present. Neurologic: Alert and oriented x3.  Gait normal  .      Skin: Intact without suspicious lesions or rashes. Lymph: No cervical, axillary lymphadenopathy present. Psych: Mood and affect are normal. Normally interactive  Assessment & Plan:  #1 comprehensive physical exam; no acute findings  Plan: see Orders  & Recommendations

## 2014-08-20 NOTE — Progress Notes (Signed)
Pre visit review using our clinic review tool, if applicable. No additional management support is needed unless otherwise documented below in the visit note. 

## 2014-08-21 LAB — BASIC METABOLIC PANEL
BUN: 14 mg/dL (ref 6–23)
CO2: 29 meq/L (ref 19–32)
Calcium: 8.9 mg/dL (ref 8.4–10.5)
Chloride: 101 mEq/L (ref 96–112)
Creatinine, Ser: 0.4 mg/dL (ref 0.4–1.2)
GFR: 157.87 mL/min (ref >60.00)
GLUCOSE: 94 mg/dL (ref 70–99)
POTASSIUM: 3.9 meq/L (ref 3.5–5.1)
SODIUM: 139 meq/L (ref 135–145)

## 2014-08-21 LAB — HEPATIC FUNCTION PANEL
ALT: 22 U/L (ref 0–35)
AST: 18 U/L (ref 0–37)
Albumin: 3.9 g/dL (ref 3.5–5.2)
Alkaline Phosphatase: 99 U/L (ref 39–117)
BILIRUBIN DIRECT: 0.1 mg/dL (ref 0.0–0.3)
BILIRUBIN TOTAL: 1.4 mg/dL — AB (ref 0.2–1.2)
TOTAL PROTEIN: 7.1 g/dL (ref 6.0–8.3)

## 2014-08-21 LAB — LIPID PANEL
CHOL/HDL RATIO: 3
Cholesterol: 188 mg/dL (ref 0–200)
HDL: 58.9 mg/dL (ref >39.00)
LDL Cholesterol: 93 mg/dL (ref 0–99)
NonHDL: 129.1
TRIGLYCERIDES: 181 mg/dL — AB (ref 0.0–149.0)
VLDL: 36.2 mg/dL (ref 0.0–40.0)

## 2014-08-21 LAB — TSH: TSH: 0.88 u[IU]/mL (ref 0.35–4.50)

## 2014-09-10 ENCOUNTER — Other Ambulatory Visit: Payer: Self-pay | Admitting: Internal Medicine

## 2014-09-22 ENCOUNTER — Other Ambulatory Visit: Payer: Self-pay | Admitting: Internal Medicine

## 2014-12-28 ENCOUNTER — Other Ambulatory Visit: Payer: Self-pay | Admitting: Internal Medicine

## 2014-12-28 NOTE — Telephone Encounter (Signed)
OK but #30; we need to evaluate decreasing dose based on new finding of greater risk with doses > 50 mg which is effective

## 2015-01-26 ENCOUNTER — Other Ambulatory Visit: Payer: Self-pay | Admitting: Internal Medicine

## 2015-01-26 NOTE — Telephone Encounter (Signed)
Ok X 3 mos

## 2015-03-29 ENCOUNTER — Other Ambulatory Visit: Payer: Self-pay | Admitting: Internal Medicine

## 2015-04-29 ENCOUNTER — Other Ambulatory Visit: Payer: Self-pay | Admitting: Internal Medicine

## 2015-07-13 ENCOUNTER — Encounter: Payer: Self-pay | Admitting: Gastroenterology

## 2015-07-14 ENCOUNTER — Encounter: Payer: Self-pay | Admitting: Gastroenterology

## 2015-07-23 LAB — HM MAMMOGRAPHY: HM Mammogram: NEGATIVE

## 2015-08-04 ENCOUNTER — Encounter: Payer: Self-pay | Admitting: Internal Medicine

## 2015-08-22 ENCOUNTER — Ambulatory Visit (INDEPENDENT_AMBULATORY_CARE_PROVIDER_SITE_OTHER): Payer: 59 | Admitting: Internal Medicine

## 2015-08-22 ENCOUNTER — Other Ambulatory Visit (INDEPENDENT_AMBULATORY_CARE_PROVIDER_SITE_OTHER): Payer: 59

## 2015-08-22 ENCOUNTER — Encounter: Payer: Self-pay | Admitting: Internal Medicine

## 2015-08-22 VITALS — BP 136/80 | HR 80 | Temp 98.0°F | Resp 20 | Ht 62.0 in | Wt 278.0 lb

## 2015-08-22 DIAGNOSIS — Z0000 Encounter for general adult medical examination without abnormal findings: Secondary | ICD-10-CM

## 2015-08-22 DIAGNOSIS — R7309 Other abnormal glucose: Secondary | ICD-10-CM | POA: Diagnosis not present

## 2015-08-22 DIAGNOSIS — R7303 Prediabetes: Secondary | ICD-10-CM

## 2015-08-22 DIAGNOSIS — E785 Hyperlipidemia, unspecified: Secondary | ICD-10-CM

## 2015-08-22 DIAGNOSIS — R0609 Other forms of dyspnea: Secondary | ICD-10-CM

## 2015-08-22 DIAGNOSIS — I10 Essential (primary) hypertension: Secondary | ICD-10-CM

## 2015-08-22 LAB — HEPATIC FUNCTION PANEL
ALT: 23 U/L (ref 0–35)
AST: 18 U/L (ref 0–37)
Albumin: 4.2 g/dL (ref 3.5–5.2)
Alkaline Phosphatase: 110 U/L (ref 39–117)
BILIRUBIN TOTAL: 0.9 mg/dL (ref 0.2–1.2)
Bilirubin, Direct: 0.1 mg/dL (ref 0.0–0.3)
Total Protein: 7.1 g/dL (ref 6.0–8.3)

## 2015-08-22 LAB — BASIC METABOLIC PANEL
BUN: 14 mg/dL (ref 6–23)
CO2: 33 mEq/L — ABNORMAL HIGH (ref 19–32)
Calcium: 9.4 mg/dL (ref 8.4–10.5)
Chloride: 101 mEq/L (ref 96–112)
Creatinine, Ser: 0.45 mg/dL (ref 0.40–1.20)
GFR: 149.32 mL/min (ref >60.00)
Glucose, Bld: 107 mg/dL — ABNORMAL HIGH (ref 70–99)
POTASSIUM: 4.2 meq/L (ref 3.5–5.1)
Sodium: 141 mEq/L (ref 135–145)

## 2015-08-22 LAB — MICROALBUMIN / CREATININE URINE RATIO
Creatinine,U: 145 mg/dL
MICROALB/CREAT RATIO: 5.7 mg/g (ref 0.0–30.0)
Microalb, Ur: 8.3 mg/dL — ABNORMAL HIGH (ref 0.0–1.9)

## 2015-08-22 LAB — LIPID PANEL
CHOLESTEROL: 190 mg/dL (ref 0–200)
HDL: 64.9 mg/dL (ref >39.00)
LDL CALC: 90 mg/dL (ref 0–99)
NonHDL: 125.21
Total CHOL/HDL Ratio: 3
Triglycerides: 175 mg/dL — ABNORMAL HIGH (ref 0.0–149.0)
VLDL: 35 mg/dL (ref 0.0–40.0)

## 2015-08-22 LAB — HEMOGLOBIN A1C: Hgb A1c MFr Bld: 6.2 % (ref 4.6–6.5)

## 2015-08-22 LAB — TSH: TSH: 1.59 u[IU]/mL (ref 0.35–4.50)

## 2015-08-22 NOTE — Assessment & Plan Note (Signed)
Lipids, LFTs, TSH  

## 2015-08-22 NOTE — Progress Notes (Signed)
   Subjective:    Patient ID: Jane Montgomery, female    DOB: 06-13-1950, 65 y.o.   MRN: [id number]  HPI    She is here for a physical;acute issues as noted below.   She's been compliant with her medications without adverse effects. She does eat some  fried foods some. She does not add salt. She's not monitoring blood pressure.   Psychosocial and medical history were reviewed . Social history: Caffeine: 2 glasses of tea per day Alcohol:  No Tobacco use: Never  Exercise: No program Personal safety/fall risk: Negative Limitations of activities of daily living: No Seatbelt/ smoke alarm use: Yes Healthcare Power of Attorney/Living Will status and End of Life process assessment : Not up-to-date, indication discussed Ophthalmologic exam status: Up-to-date Hearing evaluation status: Not up-to-date Orientation: Oriented X 3 Memory and recall: Good Spelling testing: Good Depression/anxiety assessment: Denied; Zoloft very effective Foreign travel history: Never Immunization status for influenza/pneumonia/ shingles /tetanus: UTD as per CMA Transfusion history: Never Preventive health care maintenance status: Colonoscopy/BMD/mammogram/Pap as per protocol/standard care: Colonoscopy to be scheduled in near future Dental care: Every 6 months Chart reviewed and updated. Active issues reviewed and addressed as documented .  She describes exertional dyspnea.  Review of systems is also positive for nocturia 2; loose stools intermittently; and heat intolerance.    Review of Systems  Chest pain, palpitations, tachycardia, paroxysmal nocturnal dyspnea, claudication or edema are absent. No unexplained weight loss, abdominal pain, significant dyspepsia, dysphagia, melena, rectal bleeding, or persistently small caliber stools. Dysuria, pyuria, hematuria, frequency, nocturia or polyuria are denied. Change in hair, skin, nails denied. No bowel changes of constipation or diarrhea. No intolerance to  cold.     Objective:   Physical Exam  Pertinent or positive findings include: BMI 50.83. Hearing is intact to whisper at 6 feet. Grade 0/9-2 systolic murmur at the left sternal border when supine. Abdomen is protuberant. She has valgus changes of the knees with bilateral crepitus. 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  General appearance :adequately nourished; in no distress.  Eyes: No conjunctival inflammation or scleral icterus is present.  Oral exam:  Lips and gums are healthy appearing.There is no oropharyngeal erythema or exudate noted. Dental hygiene is good.  Heart:  Normal rate and regular rhythm. S1 and S2 normal without gallop, click, rub or other extra sounds    Lungs:Chest clear to auscultation; no wheezes, rhonchi,rales ,or rubs present.No increased work of breathing.   Abdomen: bowel sounds normal, soft and non-tender without masses, organomegaly or hernias noted.  No guarding or rebound.   Vascular : all pulses equal ; no bruits present.  Skin:Warm & dry.  Intact without suspicious lesions or rashes ; no tenting or jaundice   Lymphatic: No lymphadenopathy is noted about the head, neck, axilla   Neuro: Strength, tone & DTRs normal.        Assessment & Plan:  #1 comprehensive physical exam; no acute findings. #2 DOE ; EKG WNL  Plan: see Orders  & Recommendations

## 2015-08-22 NOTE — Patient Instructions (Addendum)
Minimal Blood Pressure Goal= AVERAGE < 140/90;  Ideal is an AVERAGE < 135/85. This AVERAGE should be calculated from @ least 5-7 BP readings taken @ different times of day on different days of week. You should not respond to isolated BP readings , but rather the AVERAGE for that week .Please bring your  blood pressure cuff to office visits to verify that it is reliable.It  can also be checked against the blood pressure device at the pharmacy. Finger or wrist cuffs are not dependable; an arm cuff is.   Your next office appointment will be determined based upon review of your pending labs  and  xrays  Those written interpretation of the lab results and instructions will be transmitted to you by My Chart  Critical results will be called.  Followup as needed for any active or acute issue. Please report any significant change in your symptoms.  Please take a probiotic , Florastor OR Align, every day if the bowels are loose. This will replace the normal bacteria which  are necessary for formation of normal stool and processing of food.

## 2015-08-22 NOTE — Assessment & Plan Note (Signed)
A1c , urine microalbumin, BMET 

## 2015-08-22 NOTE — Progress Notes (Signed)
Pre visit review using our clinic review tool, if applicable. No additional management support is needed unless otherwise documented below in the visit note. 

## 2015-08-22 NOTE — Assessment & Plan Note (Signed)
Blood pressure goals reviewed. BMET 

## 2015-09-14 ENCOUNTER — Ambulatory Visit (AMBULATORY_SURGERY_CENTER): Payer: Self-pay | Admitting: *Deleted

## 2015-09-14 ENCOUNTER — Telehealth: Payer: Self-pay | Admitting: *Deleted

## 2015-09-14 ENCOUNTER — Other Ambulatory Visit: Payer: Self-pay | Admitting: Orthopedic Surgery

## 2015-09-14 VITALS — Ht 62.0 in | Wt 279.6 lb

## 2015-09-14 DIAGNOSIS — Z800 Family history of malignant neoplasm of digestive organs: Secondary | ICD-10-CM

## 2015-09-14 MED ORDER — MOVIPREP 100 G PO SOLR: ORAL | Status: DC

## 2015-09-14 NOTE — Telephone Encounter (Signed)
Patient had pre-visit for recall colonoscopy, family hx colon cancer, last colon 2011 with you. Patient's BMI 51.2, and diabetic. Would you like patient to have office visit before colon or direct hospital ok? Please advise, thanks Irisha Grandmaison.

## 2015-09-14 NOTE — Progress Notes (Signed)
Patient denies any allergies to eggs or soy. Patient denies any problems with anesthesia/sedation. Patient denies any oxygen use at home and does not take any  RX diet/weight loss medications. EMMI education declined by patient today. Phone note sent to MD, BMI 51.2.

## 2015-09-16 NOTE — Telephone Encounter (Signed)
OK to schedule for next available MAC day at [facility] with me.  Thanks

## 2015-09-20 ENCOUNTER — Other Ambulatory Visit: Payer: Self-pay | Admitting: Orthopedic Surgery

## 2015-09-20 DIAGNOSIS — M25511 Pain in right shoulder: Secondary | ICD-10-CM

## 2015-09-22 ENCOUNTER — Other Ambulatory Visit: Payer: Self-pay

## 2015-09-22 DIAGNOSIS — Z1211 Encounter for screening for malignant neoplasm of colon: Secondary | ICD-10-CM

## 2015-09-22 DIAGNOSIS — Z800 Family history of malignant neoplasm of digestive organs: Secondary | ICD-10-CM

## 2015-09-22 NOTE — Telephone Encounter (Signed)
Pt has been scheduled for a colon at [facility] she has been instructed and instructions mailed to the home.

## 2015-09-22 NOTE — Progress Notes (Signed)
Colon scheduled, pt instructed and medications reviewed.  Patient instructions mailed to home.  Patient to call with any questions or concerns.  

## 2015-09-23 ENCOUNTER — Other Ambulatory Visit: Payer: Self-pay | Admitting: Emergency Medicine

## 2015-09-23 ENCOUNTER — Other Ambulatory Visit: Payer: Self-pay | Admitting: Internal Medicine

## 2015-09-23 ENCOUNTER — Ambulatory Visit
Admission: RE | Admit: 2015-09-23 | Discharge: 2015-09-23 | Disposition: A | Discharge status: Home / Self Care | Payer: 59 | Source: Ambulatory Visit | Attending: Orthopedic Surgery | Admitting: Orthopedic Surgery

## 2015-09-23 DIAGNOSIS — M25511 Pain in right shoulder: Secondary | ICD-10-CM

## 2015-09-23 MED ORDER — AMLODIPINE BESYLATE 5 MG PO TABS: 5.0000 mg | ORAL_TABLET | Freq: Every morning | ORAL | Status: DC

## 2015-09-23 MED ORDER — BENAZEPRIL HCL 20 MG PO TABS: 20.0000 mg | ORAL_TABLET | Freq: Every day | ORAL | Status: DC

## 2015-09-23 MED ORDER — PRAVASTATIN SODIUM 40 MG PO TABS: 40.0000 mg | ORAL_TABLET | Freq: Every day | ORAL | Status: DC

## 2015-09-26 ENCOUNTER — Encounter (HOSPITAL_COMMUNITY): Payer: Self-pay | Admitting: *Deleted

## 2015-09-28 ENCOUNTER — Encounter: Payer: Self-pay | Admitting: Gastroenterology

## 2015-10-03 ENCOUNTER — Encounter (HOSPITAL_COMMUNITY): Payer: Self-pay | Admitting: Anesthesiology

## 2015-10-03 NOTE — Anesthesia Preprocedure Evaluation (Deleted)
Anesthesia Evaluation  Patient identified by MRN, date of birth, ID band Patient awake    Reviewed: Allergy & Precautions, NPO status , Patient's Chart, lab work & pertinent test results, reviewed documented beta blocker date and time   Airway        Dental   Pulmonary neg pulmonary ROS,           Cardiovascular hypertension, Pt. on medications   ECHO 2012 EF 60%   Neuro/Psych Depression negative neurological ROS     GI/Hepatic GERD  ,  Endo/Other  diabetes, Type 2Morbid obesityPre DM  Renal/GU      Musculoskeletal   Abdominal   Peds  Hematology   Anesthesia Other Findings   Reproductive/Obstetrics                             Anesthesia Physical Anesthesia Plan  ASA: III  Anesthesia Plan: MAC   Post-op Pain Management:    Induction: Intravenous  Airway Management Planned: Nasal Cannula  Additional Equipment:   Intra-op Plan:   Post-operative Plan:   Informed Consent: I have reviewed the patients History and Physical, chart, labs and discussed the procedure including the risks, benefits and alternatives for the proposed anesthesia with the patient or authorized representative who has indicated his/her understanding and acceptance.     Plan Discussed with:   Anesthesia Plan Comments:         Anesthesia Quick Evaluation

## 2015-10-04 ENCOUNTER — Encounter: Payer: Self-pay | Admitting: Internal Medicine

## 2015-10-04 ENCOUNTER — Encounter (HOSPITAL_COMMUNITY): Payer: Self-pay

## 2015-10-04 ENCOUNTER — Encounter (HOSPITAL_COMMUNITY)
Admission: RE | Admit: 2015-10-04 | Discharge: 2015-10-04 | Disposition: A | Discharge status: Home / Self Care | Payer: 59 | Source: Ambulatory Visit | Attending: Orthopedic Surgery | Admitting: Orthopedic Surgery

## 2015-10-04 DIAGNOSIS — Z7982 Long term (current) use of aspirin: Secondary | ICD-10-CM | POA: Insufficient documentation

## 2015-10-04 DIAGNOSIS — I10 Essential (primary) hypertension: Secondary | ICD-10-CM | POA: Insufficient documentation

## 2015-10-04 DIAGNOSIS — Z01818 Encounter for other preprocedural examination: Secondary | ICD-10-CM | POA: Diagnosis not present

## 2015-10-04 DIAGNOSIS — Z01812 Encounter for preprocedural laboratory examination: Secondary | ICD-10-CM | POA: Diagnosis not present

## 2015-10-04 DIAGNOSIS — E785 Hyperlipidemia, unspecified: Secondary | ICD-10-CM | POA: Insufficient documentation

## 2015-10-04 DIAGNOSIS — G250 Essential tremor: Secondary | ICD-10-CM | POA: Insufficient documentation

## 2015-10-04 DIAGNOSIS — M19011 Primary osteoarthritis, right shoulder: Secondary | ICD-10-CM | POA: Insufficient documentation

## 2015-10-04 DIAGNOSIS — I517 Cardiomegaly: Secondary | ICD-10-CM | POA: Insufficient documentation

## 2015-10-04 DIAGNOSIS — Z79899 Other long term (current) drug therapy: Secondary | ICD-10-CM | POA: Diagnosis not present

## 2015-10-04 DIAGNOSIS — G4733 Obstructive sleep apnea (adult) (pediatric): Secondary | ICD-10-CM | POA: Insufficient documentation

## 2015-10-04 DIAGNOSIS — E119 Type 2 diabetes mellitus without complications: Secondary | ICD-10-CM | POA: Insufficient documentation

## 2015-10-04 HISTORY — DX: Other chronic pain: G89.29

## 2015-10-04 HISTORY — DX: Nocturia: R35.1

## 2015-10-04 HISTORY — DX: Unspecified macular degeneration: H35.30

## 2015-10-04 HISTORY — DX: Diverticulosis of intestine, part unspecified, without perforation or abscess without bleeding: K57.90

## 2015-10-04 HISTORY — DX: Pain in unspecified joint: M25.50

## 2015-10-04 HISTORY — DX: Unspecified osteoarthritis, unspecified site: M19.90

## 2015-10-04 HISTORY — DX: Dorsalgia, unspecified: M54.9

## 2015-10-04 HISTORY — DX: Diarrhea, unspecified: R19.7

## 2015-10-04 HISTORY — DX: Reserved for inherently not codable concepts without codable children: IMO0001

## 2015-10-04 LAB — URINE MICROSCOPIC-ADD ON

## 2015-10-04 LAB — COMPREHENSIVE METABOLIC PANEL
ALBUMIN: 3.8 g/dL (ref 3.5–5.0)
ALT: 26 U/L (ref 14–54)
AST: 24 U/L (ref 15–41)
Alkaline Phosphatase: 110 U/L (ref 38–126)
Anion gap: 13 (ref 5–15)
BILIRUBIN TOTAL: 0.9 mg/dL (ref 0.3–1.2)
BUN: 13 mg/dL (ref 6–20)
CHLORIDE: 100 mmol/L — AB (ref 101–111)
CO2: 27 mmol/L (ref 22–32)
Calcium: <4 mg/dL — CL (ref 8.9–10.3)
Creatinine, Ser: 0.54 mg/dL (ref 0.44–1.00)
GFR calc Af Amer: >60 mL/min (ref >60)
GFR calc non Af Amer: >60 mL/min (ref >60)
GLUCOSE: 131 mg/dL — AB (ref 65–99)
POTASSIUM: 7.2 mmol/L — AB (ref 3.5–5.1)
Sodium: 140 mmol/L (ref 135–145)
TOTAL PROTEIN: 6.8 g/dL (ref 6.5–8.1)

## 2015-10-04 LAB — SURGICAL PCR SCREEN
MRSA, PCR: NEGATIVE
STAPHYLOCOCCUS AUREUS: NEGATIVE

## 2015-10-04 LAB — CBC WITH DIFFERENTIAL/PLATELET
BASOS ABS: 0 10*3/uL (ref 0.0–0.1)
BASOS PCT: 0 %
Eosinophils Absolute: 0.2 10*3/uL (ref 0.0–0.7)
Eosinophils Relative: 3 %
HEMATOCRIT: 41.5 % (ref 36.0–46.0)
Hemoglobin: 13.3 g/dL (ref 12.0–15.0)
Lymphocytes Relative: 23 %
Lymphs Abs: 1.7 10*3/uL (ref 0.7–4.0)
MCH: 28 pg (ref 26.0–34.0)
MCHC: 32 g/dL (ref 30.0–36.0)
MCV: 87.4 fL (ref 78.0–100.0)
MONO ABS: 0.3 10*3/uL (ref 0.1–1.0)
Monocytes Relative: 5 %
NEUTROS ABS: 5.2 10*3/uL (ref 1.7–7.7)
NEUTROS PCT: 69 %
Platelets: 198 10*3/uL (ref 150–400)
RBC: 4.75 MIL/uL (ref 3.87–5.11)
RDW: 15.3 % (ref 11.5–15.5)
WBC: 7.4 10*3/uL (ref 4.0–10.5)

## 2015-10-04 LAB — APTT: APTT: 32 s (ref 24–37)

## 2015-10-04 LAB — URINALYSIS, ROUTINE W REFLEX MICROSCOPIC
Bilirubin Urine: NEGATIVE
Glucose, UA: NEGATIVE mg/dL
Hgb urine dipstick: NEGATIVE
Ketones, ur: NEGATIVE mg/dL
NITRITE: NEGATIVE
Protein, ur: 30 mg/dL — AB
SPECIFIC GRAVITY, URINE: 1.025 (ref 1.005–1.030)
UROBILINOGEN UA: 0.2 mg/dL (ref 0.0–1.0)
pH: 6 (ref 5.0–8.0)

## 2015-10-04 LAB — PROTIME-INR
INR: 0.98 (ref 0.00–1.49)
Prothrombin Time: 13.2 seconds (ref 11.6–15.2)

## 2015-10-04 LAB — GLUCOSE, CAPILLARY: GLUCOSE-CAPILLARY: 147 mg/dL — AB (ref 65–99)

## 2015-10-04 MED ORDER — POVIDONE-IODINE 7.5 % EX SOLN: Freq: Once | CUTANEOUS | Status: DC

## 2015-10-04 NOTE — Progress Notes (Addendum)
Cardiologist-saw Dr.Nishan in 2012 d/t ankle swelling and ruling out any heart issues  Medical Md is Dr.William Linna Darner  Echo report in epic from 2012  Stress test done > 10 yrs ago  Heart cath denies ever having one  EKG in epic from 08-22-15  CXR denies having one in past yr

## 2015-10-04 NOTE — Progress Notes (Signed)
Spoke with Danielle,Dr.Chandlers PA notified her of K+ 7.2 and Calcium < 4.0.No new orders received at this time.

## 2015-10-04 NOTE — Progress Notes (Signed)
   10/04/15 1022  OBSTRUCTIVE SLEEP APNEA  Have you ever been diagnosed with sleep apnea through a sleep study? No  Do you snore loudly (loud enough to be heard through closed doors)?  1  Do you often feel tired, fatigued, or sleepy during the daytime (such as falling asleep during driving or talking to someone)? 0  Has anyone observed you stop breathing during your sleep? 0  Do you have, or are you being treated for high blood pressure? 1  BMI more than 35 kg/m2? 1  Age > 50 (1-yes) 1  Neck circumference greater than:Female 16 inches or larger, Female 17inches or larger? 0  Female Gender (Yes=1) 0  Obstructive Sleep Apnea Score 4  Score 5 or greater  Results sent to PCP

## 2015-10-04 NOTE — Progress Notes (Signed)
Verified reason for surgery with Carla,Dr.Chandler's scheduler.Reason is Right Shoulder Osteoarthritis

## 2015-10-04 NOTE — Pre-Procedure Instructions (Signed)
Jane Montgomery  10/04/2015      [id number] - [place], [address] AT [address] [place] [address] [place] [address] Phone: [phone] Fax: [phone]    Your procedure is scheduled on Thurs, Oct 13 @ 7:30 AM  Report to [facility] Admitting at 5:30 AM  Call this number if you have problems the morning of surgery:  [phone]   Remember:  Do not eat food or drink liquids after midnight.  Take these medicines the morning of surgery with A SIP OF WATER Amlodipine(Norvasc),Flonase(Fluticasone),Eye Drops,and Zoloft(Sertraline)             Stop taking your Aspirin and Ibuprofen 7 days prior to surgery. No Goody's,BC's,Aleve,Fish Oil,or any Herbal Medications.    Do not wear jewelry, make-up or nail polish.  Do not wear lotions, powders, or perfumes.    Do not shave 48 hours prior to surgery.    Do not bring valuables to the hospital.  [facility] is not responsible for any belongings or valuables.  Contacts, dentures or bridgework may not be worn into surgery.  Leave your suitcase in the car.  After surgery it may be brought to your room.  For patients admitted to the hospital, discharge time will be determined by your treatment team.  Patients discharged the day of surgery will not be allowed to drive home.    Special instructions:  [place] - Preparing for Surgery  Before surgery, you can play an important role.  Because skin is not sterile, your skin needs to be as free of germs as possible.  You can reduce the number of germs on you skin by washing with CHG (chlorahexidine gluconate) soap before surgery.  CHG is an antiseptic cleaner which kills germs and bonds with the skin to continue killing germs even after washing.  Please DO NOT use if you have an allergy to CHG or antibacterial soaps.  If your skin becomes reddened/irritated stop using the CHG and inform your nurse when you arrive at Short Stay.  Do  not shave (including legs and underarms) for at least 48 hours prior to the first CHG shower.  You may shave your face.  Please follow these instructions carefully:   1.  Shower with CHG Soap the night before surgery and the                                morning of Surgery.  2.  If you choose to wash your hair, wash your hair first as usual with your       normal shampoo.  3.  After you shampoo, rinse your hair and body thoroughly to remove the                      Shampoo.  4.  Use CHG as you would any other liquid soap.  You can apply chg directly       to the skin and wash gently with scrungie or a clean washcloth.  5.  Apply the CHG Soap to your body ONLY FROM THE NECK DOWN.        Do not use on open wounds or open sores.  Avoid contact with your eyes,       ears, mouth and genitals (private parts).  Wash genitals (private parts)       with your normal  soap.  6.  Wash thoroughly, paying special attention to the area where your surgery        will be performed.  7.  Thoroughly rinse your body with warm water from the neck down.  8.  DO NOT shower/wash with your normal soap after using and rinsing off       the CHG Soap.  9.  Pat yourself dry with a clean towel.            10.  Wear clean pajamas.            11.  Place clean sheets on your bed the night of your first shower and do not        sleep with pets.  Day of Surgery  Do not apply any lotions/deoderants the morning of surgery.  Please wear clean clothes to the hospital/surgery center.    Please read over the following fact sheets that you were given. Pain Booklet, Coughing and Deep Breathing, MRSA Information and Surgical Site Infection Prevention

## 2015-10-05 ENCOUNTER — Emergency Department (HOSPITAL_COMMUNITY)
Admission: EM | Admit: 2015-10-05 | Discharge: 2015-10-05 | Disposition: A | Discharge status: Home / Self Care | Payer: 59 | Attending: Emergency Medicine | Admitting: Emergency Medicine

## 2015-10-05 ENCOUNTER — Telehealth: Payer: Self-pay | Admitting: Gastroenterology

## 2015-10-05 ENCOUNTER — Encounter (HOSPITAL_COMMUNITY): Payer: Self-pay

## 2015-10-05 ENCOUNTER — Other Ambulatory Visit: Payer: Self-pay

## 2015-10-05 DIAGNOSIS — F329 Major depressive disorder, single episode, unspecified: Secondary | ICD-10-CM | POA: Insufficient documentation

## 2015-10-05 DIAGNOSIS — M1990 Unspecified osteoarthritis, unspecified site: Secondary | ICD-10-CM | POA: Insufficient documentation

## 2015-10-05 DIAGNOSIS — G8929 Other chronic pain: Secondary | ICD-10-CM | POA: Insufficient documentation

## 2015-10-05 DIAGNOSIS — Z79899 Other long term (current) drug therapy: Secondary | ICD-10-CM | POA: Diagnosis not present

## 2015-10-05 DIAGNOSIS — Z7982 Long term (current) use of aspirin: Secondary | ICD-10-CM | POA: Insufficient documentation

## 2015-10-05 DIAGNOSIS — Z7951 Long term (current) use of inhaled steroids: Secondary | ICD-10-CM | POA: Insufficient documentation

## 2015-10-05 DIAGNOSIS — E785 Hyperlipidemia, unspecified: Secondary | ICD-10-CM | POA: Diagnosis not present

## 2015-10-05 DIAGNOSIS — Z8632 Personal history of gestational diabetes: Secondary | ICD-10-CM | POA: Insufficient documentation

## 2015-10-05 DIAGNOSIS — Z8719 Personal history of other diseases of the digestive system: Secondary | ICD-10-CM | POA: Diagnosis not present

## 2015-10-05 DIAGNOSIS — Z01812 Encounter for preprocedural laboratory examination: Secondary | ICD-10-CM | POA: Diagnosis present

## 2015-10-05 DIAGNOSIS — Z01818 Encounter for other preprocedural examination: Secondary | ICD-10-CM

## 2015-10-05 LAB — CBC
HEMATOCRIT: 40.6 % (ref 36.0–46.0)
Hemoglobin: 12.7 g/dL (ref 12.0–15.0)
MCH: 27.3 pg (ref 26.0–34.0)
MCHC: 31.3 g/dL (ref 30.0–36.0)
MCV: 87.3 fL (ref 78.0–100.0)
PLATELETS: 175 10*3/uL (ref 150–400)
RBC: 4.65 MIL/uL (ref 3.87–5.11)
RDW: 15.2 % (ref 11.5–15.5)
WBC: 7.3 10*3/uL (ref 4.0–10.5)

## 2015-10-05 LAB — BASIC METABOLIC PANEL
ANION GAP: 6 (ref 5–15)
BUN: 12 mg/dL (ref 6–20)
CO2: 31 mmol/L (ref 22–32)
Calcium: 8.8 mg/dL — ABNORMAL LOW (ref 8.9–10.3)
Chloride: 102 mmol/L (ref 101–111)
Creatinine, Ser: 0.46 mg/dL (ref 0.44–1.00)
GLUCOSE: 110 mg/dL — AB (ref 65–99)
POTASSIUM: 3.5 mmol/L (ref 3.5–5.1)
Sodium: 139 mmol/L (ref 135–145)

## 2015-10-05 LAB — HEMOGLOBIN A1C
Hgb A1c MFr Bld: 6.3 % — ABNORMAL HIGH (ref 4.8–5.6)
MEAN PLASMA GLUCOSE: 134 mg/dL

## 2015-10-05 NOTE — Discharge Instructions (Signed)
Your potassium is normal, follow-up with your surgeons.

## 2015-10-05 NOTE — ED Provider Notes (Signed)
CSN: [id number]     Arrival date & time 10/05/15  1724 History   First MD Initiated Contact with Patient 10/05/15 1754     Chief Complaint  Patient presents with  . Potassium 7.2      (Consider location/radiation/quality/duration/timing/severity/associated sxs/prior Treatment) Patient is a 65 y.o. female presenting with general illness. The history is provided by the patient.  Illness Location:  Blood Quality:  Hyperkalemia on screening labs Onset quality:  Gradual Timing:  Constant Progression:  Unchanged Chronicity:  New Associated symptoms: no abdominal pain, no cough, no fatigue, no fever, no shortness of breath and no vomiting     Past Medical History  Diagnosis Date  . Hyperlipidemia     takes Pravastatin daily  . 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 Tremor, essential     Dr Erling Cruz  . Gilbert syndrome   . Benign positional vertigo     in [place]  . Depression     takes Zoloft daily  . Hypertension     takes Amlodipine daily as well as Benazepril  . Shortness of breath dyspnea     allergy related and occasionally will take Claritin.  . Arthritis   . Joint pain   . Chronic back pain     reason unknown   . Diarrhea   . Diverticulosis   . Nocturia   . Gestational diabetes 1982, 1984    "pre"  . Macular degeneration     early stages and dry   Past Surgical History  Procedure Laterality Date  . Plantar fascia surgery Bilateral   . Cesarean section      x 2  . Dilation and curettage of uterus      Dr Irven [place]  . Colonoscopy  [phone]    Negative, redundant colon; [facility]  . Tonsillectomy    . Cataract extraction Bilateral 2015   Family History  Problem Relation Age of Onset  . Hypertension Father   . Heart attack Father 3    CABG   . Colon cancer Mother 86  . Macular degeneration Mother   . Hypertension Mother   . Seizures Mother     ? etiology (preceded CVA)  . Stroke Mother 46  . Osteoporosis Mother   . Myelodysplastic syndrome  Mother   . Colon cancer Maternal Grandmother   . Colon cancer Maternal Aunt   . Colon cancer      2 Maternal Great Aunts  . Heart attack Paternal Grandfather     > 55  . Heart attack Paternal Aunt 72  . Leukemia Paternal Grandmother   . Diabetes Neg Hx    Social History  Substance Use Topics  . Smoking status: Never Smoker   . Smokeless tobacco: Never Used  . Alcohol Use: No   OB History    No data available     Review of Systems  Constitutional: Negative for fever and fatigue.  Respiratory: Negative for cough and shortness of breath.   Gastrointestinal: Negative for vomiting and abdominal pain.  All other systems reviewed and are negative.     Allergies  Review of patient's allergies indicates no known allergies.  Home Medications   Prior to Admission medications   Medication Sig Start Date End Date Taking? Authorizing Provider  amLODipine (NORVASC) 5 MG tablet Take 1 tablet (5 mg total) by mouth every morning. 09/23/15  Yes Hendricks Limes, MD  aspirin 81 MG tablet Take 81 mg by mouth daily.  Yes Historical Provider, MD  benazepril (LOTENSIN) 20 MG tablet Take 1 tablet (20 mg total) by mouth daily. 09/23/15  Yes Hendricks Limes, MD  Biotin 7500 MCG TABS Take 1 tablet by mouth daily.   Yes Historical Provider, MD  Coenzyme Q10 (COQ10) 200 MG CAPS Take 400 mg by mouth daily.   Yes Historical Provider, MD  fluticasone (FLONASE) 50 MCG/ACT nasal spray Place 1 spray into both nostrils daily.   Yes Historical Provider, MD  Forskolin POWD Take 1 capsule by mouth daily.   Yes Historical Provider, MD  GARCINIA CAMBOGIA-CHROMIUM PO Take 2 capsules by mouth daily.   Yes Historical Provider, MD  ibuprofen (ADVIL,MOTRIN) 200 MG tablet Take 400-800 mg by mouth every 6 (six) hours as needed for moderate pain.    Yes Historical Provider, MD  Multiple Vitamins-Minerals (ICAPS PO) Take 1 tablet by mouth 2 (two) times daily.    Yes Historical Provider, MD  pravastatin (PRAVACHOL) 40  MG tablet Take 1 tablet (40 mg total) by mouth at bedtime. 09/23/15  Yes Hendricks Limes, MD  RESTASIS 0.05 % ophthalmic emulsion Place 1 drop into both eyes 2 (two) times daily. 09/26/15  Yes Historical Provider, MD  sertraline (ZOLOFT) 100 MG tablet TAKE 1 TABLET BY MOUTH EVERY DAY 07/01/13  Yes Hendricks Limes, MD  MOVIPREP 100 G SOLR (no substitutions)-TAKE AS DIRECTED. 09/14/15   Milus Banister, MD   BP 151/82 mmHg  Pulse 66  Temp(Src) 98 F (36.7 C) (Oral)  Resp 16  SpO2 95% Physical Exam  Constitutional: She is oriented to person, place, and time. She appears well-developed and well-nourished. No distress.  HENT:  Head: Normocephalic.  Eyes: Conjunctivae are normal.  Neck: Neck supple. No tracheal deviation present.  Cardiovascular: Normal rate and regular rhythm.   Pulmonary/Chest: Effort normal. No respiratory distress.  Abdominal: Soft. She exhibits no distension.  Neurological: She is alert and oriented to person, place, and time.  Skin: Skin is warm and dry.  Psychiatric: She has a normal mood and affect.    ED Course  Procedures (including critical care time) Labs Review Labs Reviewed  BASIC METABOLIC PANEL - Abnormal; Notable for the following:    Glucose, Bld 110 (*)    Calcium 8.8 (*)    All other components within normal limits  CBC    Imaging Review Dg Chest 2 View  10/04/2015   CLINICAL DATA:  Preoperative examination prior to right total shoulder arthroplasty, history of diabetes heart murmur, nonsmoker.  EXAM: CHEST  2 VIEW  COMPARISON:  None in PACs  FINDINGS: The lungs are adequately inflated. There is no focal infiltrate. The interstitial markings are coarse especially in the left perihilar region. The heart is top-normal in size. The pulmonary vascularity is normal. There is tortuosity of the descending thoracic aorta. There is mild multilevel degenerative disc disease of the thoracic spine.  IMPRESSION: Mild cardiomegaly without CHF. Coarse interstitial  lung markings likely reflect chronic bronchitic changes.   Electronically Signed   By: David  Martinique M.D.   On: 10/04/2015 10:48   I have personally reviewed and evaluated these images and lab results as part of my medical decision-making.   EKG Interpretation None      MDM   Final diagnoses:  Preoperative evaluation to rule out surgical contraindication    65 y.o. female presents with concern for elevated potassium on pre-op lab evaluation. No EKG changes suggestive of critical hyperkalemia. Rechecked and K is normal. Discharged with routine  follow up.     Leo Grosser, MD 10/06/15 [phone]

## 2015-10-05 NOTE — ED Notes (Signed)
Pt presents d/t abnormal lab value.  Pt reports having pre-op lab work yesterday and was notified that her potassium is 7.  Denies complaints.

## 2015-10-06 ENCOUNTER — Ambulatory Visit (HOSPITAL_COMMUNITY): Admission: RE | Admit: 2015-10-06 | Payer: 59 | Source: Ambulatory Visit | Admitting: Gastroenterology

## 2015-10-06 SURGERY — COLONOSCOPY WITH PROPOFOL
Anesthesia: Monitor Anesthesia Care

## 2015-10-06 NOTE — Progress Notes (Signed)
Anesthesia Chart Review:  Pt is a 65 year old female scheduled for R total shoulder arthroplasty on 10/13/2015 with Dr. Tamera Punt.   PMH includes: HTN, hyperlipidemia, benign positional vertigo, Gilbert syndrome, anisocoria, essential tremor.  Never smoker. BMI 51.   Medications include: amlodipine, ASA, benazepril, pravastatin.   Preoperative labs reviewed.  K 7.2, Ca <4.0. Andee Poles, [place] in Dr. Bettina Gavia office notified by PAT RN. I called pt and instructed her to go to ER for eval.  Recheck labs show K WNL and Ca 8.8.   Chest x-ray 10/04/2015 reviewed. Mild cardiomegaly without CHF. Coarse interstitial lung markings likely reflect chronic bronchitic changes.   EKG 08/22/2015: sinus rhythm with rate variation.   Echo 08/22/2011:  - Left ventricle: The cavity size was normal. Systolic function was normal. The estimated ejection fraction was in the range of 55% to 60%. Wall motion was normal; there were no regional wall motion abnormalities. - Atrial septum: No defect or patent foramen ovale was identified.  If no changes, I anticipate pt can proceed with surgery as scheduled.   Willeen Cass, FNP-BC [facility]/Anesthesiology Phone: [phone] 10/06/2015 12:05 PM

## 2015-10-06 NOTE — Telephone Encounter (Signed)
Pt will call when she is ready to reschedule her procedure

## 2015-10-07 ENCOUNTER — Ambulatory Visit: Payer: 59 | Admitting: Internal Medicine

## 2015-10-12 IMAGING — CT CT SHOULDER*R* W/O CM
2 of 3 series · 13 of 20 positions shown, 16 images · non-contrast
Comparison: None.

CLINICAL DATA: RIGHT shoulder pain. RIGHT shoulder osteoarthritis.
Preoperative evaluation for RIGHT shoulder arthroplasty.

EXAM:
CT OF THE RIGHT SHOULDER WITHOUT CONTRAST
TECHNIQUE: Multidetector CT imaging was performed according to the standard
protocol. Multiplanar CT image reconstructions were also generated.

[Series 3: soft tissue · axial · 0.56mm/px · z∈[-270,-62]mm · 12 of 83 slices shown, 15 images]
[im 7/83  soft-tissue]
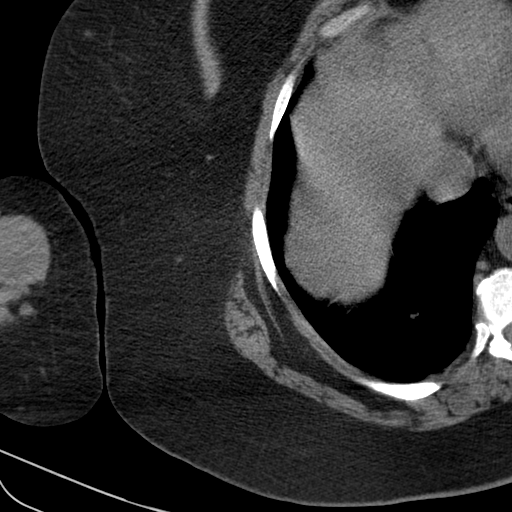
[im 7/83  bone]
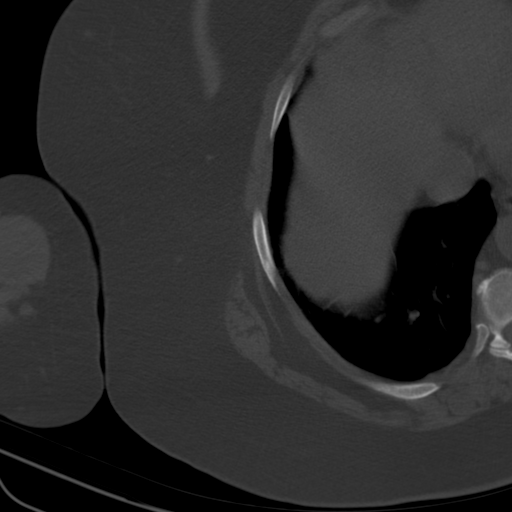
[im 13/83  bone]
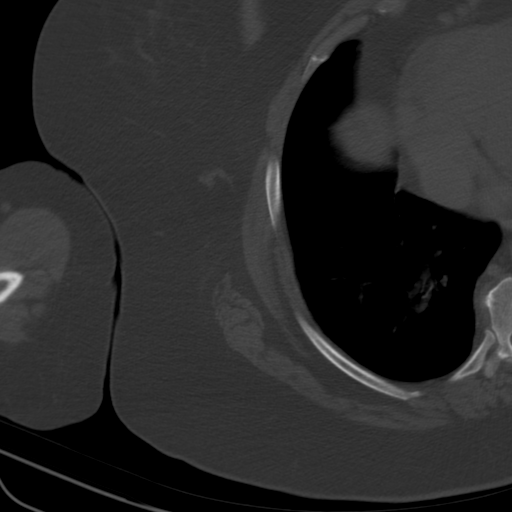
[im 19/83  bone]
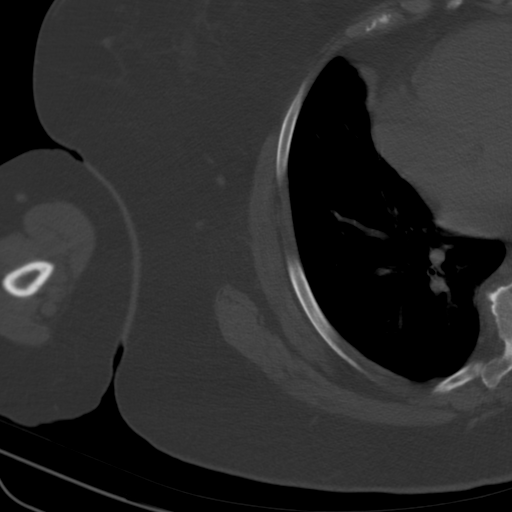
[im 26/83  bone]
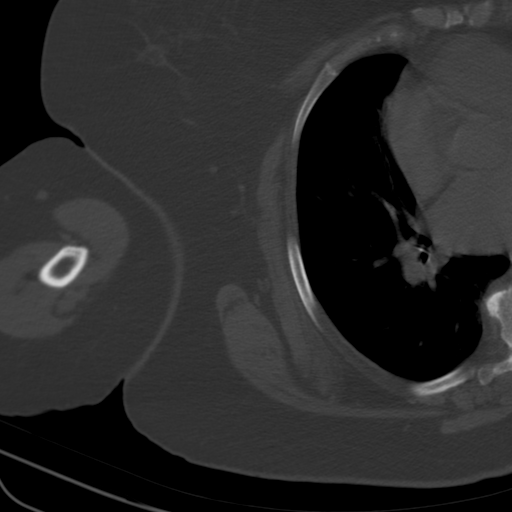
[im 32/83  soft-tissue]
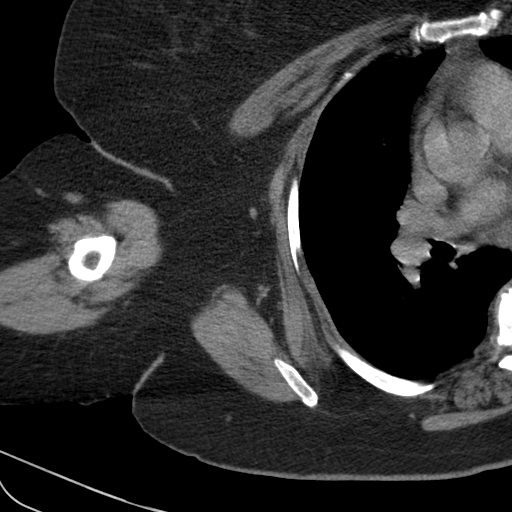
[im 32/83  bone]
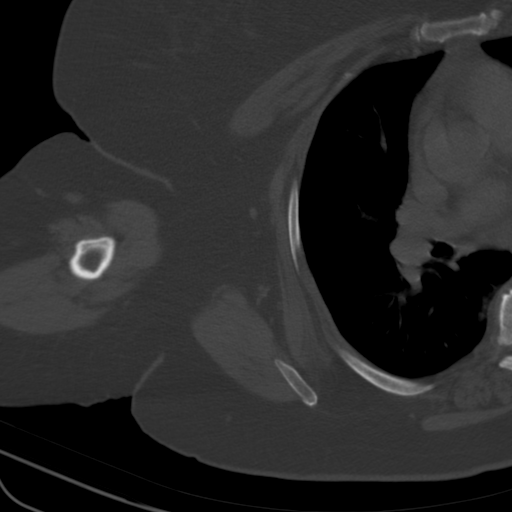
[im 38/83  bone]
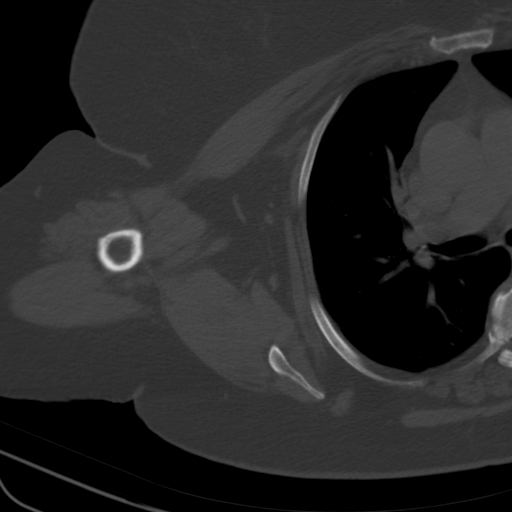
[im 45/83  bone]
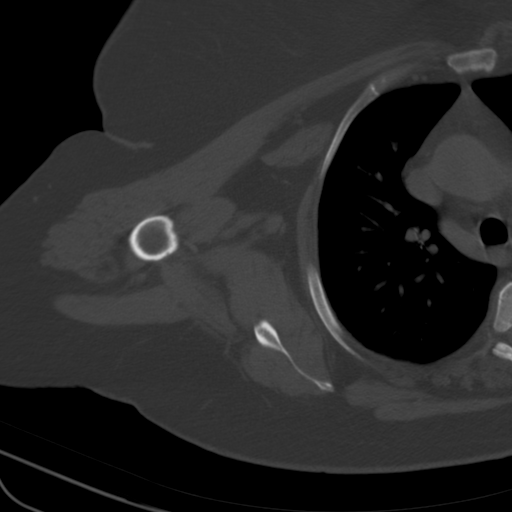
[im 51/83  bone]
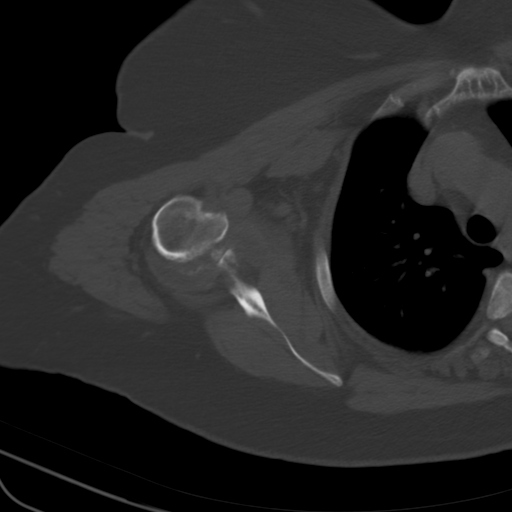
[im 57/83  soft-tissue]
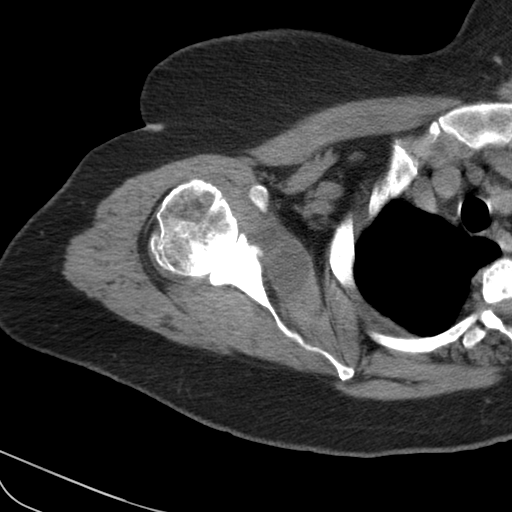
[im 57/83  bone]
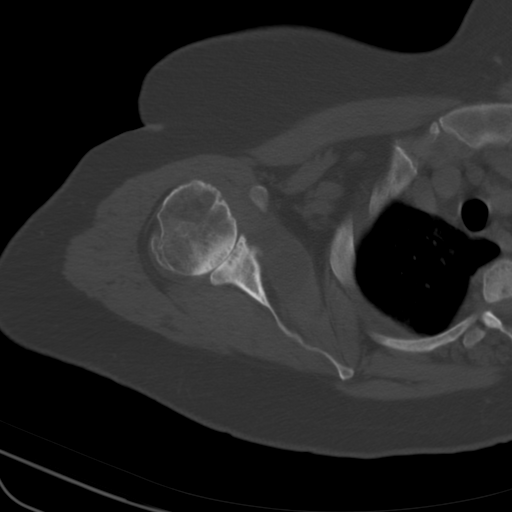
[im 64/83  bone]
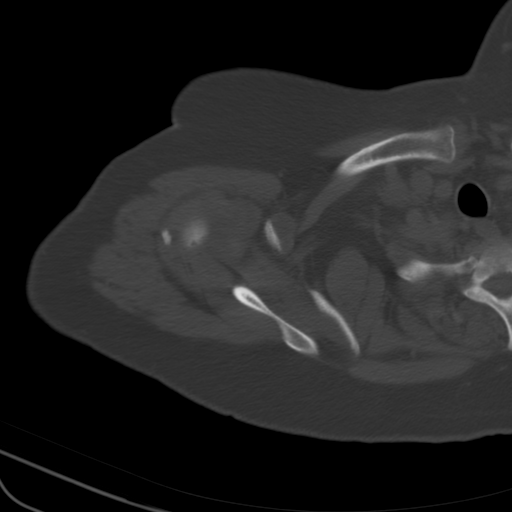
[im 70/83  bone]
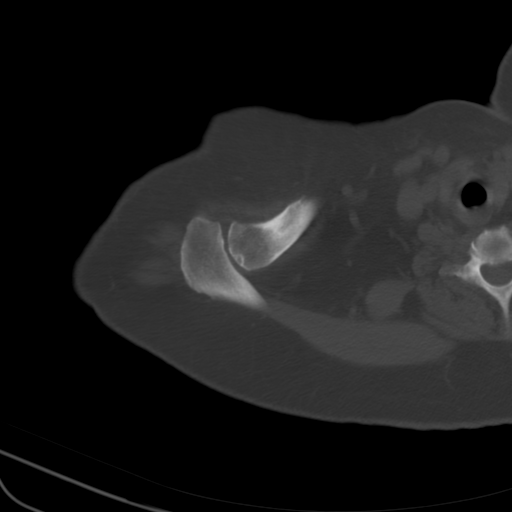
[im 76/83  bone]
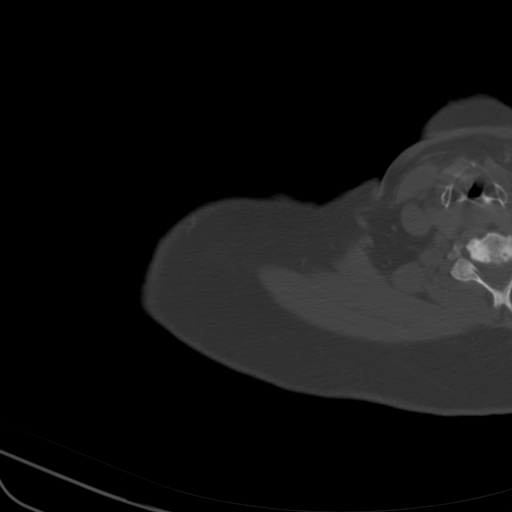

[Series 7: cor obl bone · coronal · 0.50mm/px · 1 of 366 slices shown]
[im 183/366  bone]
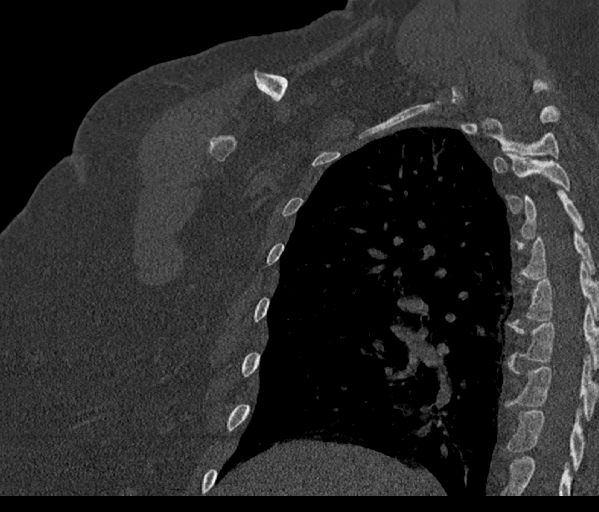

[13 of 20 positions shown; findings below may reference images not displayed]

FINDINGS: Severe RIGHT glenohumeral osteoarthritis is present. Large
glenohumeral effusion and/ synovitis. Fluid attenuation structure
dissects along the SUBSCAPULARIS musculotendinous junction, probably
representing a ganglion arising from the superior SUBSCAPULARIS
recess and dissecting along the tendon. Biceps long head tendon
sheath is also distended. There is no rotator cuff muscular atrophy.
No aggressive osseous lesions.
IMPRESSION: Severe RIGHT glenohumeral osteoarthritis.  No rotator cuff atrophy.

## 2015-10-12 MED ORDER — DEXTROSE 5 % IV SOLN
3.0000 g | INTRAVENOUS | Status: AC
  Administered 2015-10-13: 3 g via INTRAVENOUS
  Filled 2015-10-12: qty 3000

## 2015-10-13 ENCOUNTER — Inpatient Hospital Stay (HOSPITAL_COMMUNITY)
Admission: RE | Admit: 2015-10-13 | Discharge: 2015-10-14 | DRG: 483 | Disposition: A | Discharge status: Home / Self Care | Payer: 59 | Source: Ambulatory Visit | Attending: Orthopedic Surgery | Admitting: Orthopedic Surgery

## 2015-10-13 ENCOUNTER — Encounter (HOSPITAL_COMMUNITY)
Admission: RE | Disposition: A | Discharge status: Home / Self Care | Payer: Self-pay | Source: Ambulatory Visit | Attending: Orthopedic Surgery

## 2015-10-13 ENCOUNTER — Inpatient Hospital Stay (HOSPITAL_COMMUNITY): Payer: 59 | Admitting: Vascular Surgery

## 2015-10-13 ENCOUNTER — Inpatient Hospital Stay (HOSPITAL_COMMUNITY): Payer: 59

## 2015-10-13 ENCOUNTER — Encounter (HOSPITAL_COMMUNITY): Payer: Self-pay | Admitting: Anesthesiology

## 2015-10-13 ENCOUNTER — Inpatient Hospital Stay (HOSPITAL_COMMUNITY): Payer: 59 | Admitting: Anesthesiology

## 2015-10-13 DIAGNOSIS — Z79899 Other long term (current) drug therapy: Secondary | ICD-10-CM | POA: Diagnosis not present

## 2015-10-13 DIAGNOSIS — Z9842 Cataract extraction status, left eye: Secondary | ICD-10-CM

## 2015-10-13 DIAGNOSIS — F329 Major depressive disorder, single episode, unspecified: Secondary | ICD-10-CM | POA: Diagnosis present

## 2015-10-13 DIAGNOSIS — M19011 Primary osteoarthritis, right shoulder: Principal | ICD-10-CM | POA: Diagnosis present

## 2015-10-13 DIAGNOSIS — Z9841 Cataract extraction status, right eye: Secondary | ICD-10-CM

## 2015-10-13 DIAGNOSIS — G4730 Sleep apnea, unspecified: Secondary | ICD-10-CM | POA: Diagnosis present

## 2015-10-13 DIAGNOSIS — Z8249 Family history of ischemic heart disease and other diseases of the circulatory system: Secondary | ICD-10-CM

## 2015-10-13 DIAGNOSIS — Z6841 Body Mass Index (BMI) 40.0 and over, adult: Secondary | ICD-10-CM

## 2015-10-13 DIAGNOSIS — Z7982 Long term (current) use of aspirin: Secondary | ICD-10-CM | POA: Diagnosis not present

## 2015-10-13 DIAGNOSIS — E785 Hyperlipidemia, unspecified: Secondary | ICD-10-CM | POA: Diagnosis present

## 2015-10-13 DIAGNOSIS — Z96611 Presence of right artificial shoulder joint: Secondary | ICD-10-CM

## 2015-10-13 HISTORY — PX: TOTAL SHOULDER ARTHROPLASTY: SHX126

## 2015-10-13 SURGERY — ARTHROPLASTY, SHOULDER, TOTAL
Anesthesia: General | Site: Shoulder | Laterality: Right

## 2015-10-13 MED ORDER — DEXTROSE 5 % IV SOLN
10.0000 mg | INTRAVENOUS | Status: DC | PRN
  Administered 2015-10-13: 50 ug/min via INTRAVENOUS

## 2015-10-13 MED ORDER — SODIUM CHLORIDE 0.9 % IR SOLN
Status: DC | PRN
  Administered 2015-10-13: 1000 mL

## 2015-10-13 MED ORDER — CEFAZOLIN SODIUM-DEXTROSE 2-3 GM-% IV SOLR
2.0000 g | Freq: Four times a day (QID) | INTRAVENOUS | Status: AC
  Administered 2015-10-13 – 2015-10-14 (×3): 2 g via INTRAVENOUS
  Filled 2015-10-13 (×4): qty 50

## 2015-10-13 MED ORDER — FENTANYL CITRATE (PF) 250 MCG/5ML IJ SOLN
INTRAMUSCULAR | Status: AC
  Filled 2015-10-13: qty 5

## 2015-10-13 MED ORDER — METOCLOPRAMIDE HCL 5 MG PO TABS: 5.0000 mg | ORAL_TABLET | Freq: Three times a day (TID) | ORAL | Status: DC | PRN

## 2015-10-13 MED ORDER — LIDOCAINE HCL (CARDIAC) 20 MG/ML IV SOLN
INTRAVENOUS | Status: DC | PRN
  Administered 2015-10-13: 100 mg via INTRAVENOUS

## 2015-10-13 MED ORDER — DEXAMETHASONE SODIUM PHOSPHATE 4 MG/ML IJ SOLN
INTRAMUSCULAR | Status: DC | PRN
  Administered 2015-10-13: 4 mg via INTRAVENOUS

## 2015-10-13 MED ORDER — ACETAMINOPHEN 500 MG PO TABS
1000.0000 mg | ORAL_TABLET | Freq: Four times a day (QID) | ORAL | Status: AC
  Administered 2015-10-13 – 2015-10-14 (×4): 1000 mg via ORAL
  Filled 2015-10-13 (×4): qty 2

## 2015-10-13 MED ORDER — LACTATED RINGERS IV SOLN
INTRAVENOUS | Status: DC | PRN
  Administered 2015-10-13 (×2): via INTRAVENOUS

## 2015-10-13 MED ORDER — TRANEXAMIC ACID 1000 MG/10ML IV SOLN
1000.0000 mg | INTRAVENOUS | Status: AC
  Administered 2015-10-13: 1000 mg via INTRAVENOUS
  Filled 2015-10-13: qty 10

## 2015-10-13 MED ORDER — OXYCODONE-ACETAMINOPHEN 5-325 MG PO TABS: 1.0000 | ORAL_TABLET | ORAL | Status: DC | PRN

## 2015-10-13 MED ORDER — EPHEDRINE SULFATE 50 MG/ML IJ SOLN
INTRAMUSCULAR | Status: AC
  Filled 2015-10-13: qty 1

## 2015-10-13 MED ORDER — ZOLPIDEM TARTRATE 5 MG PO TABS: 5.0000 mg | ORAL_TABLET | Freq: Every evening | ORAL | Status: DC | PRN

## 2015-10-13 MED ORDER — HEMOSTATIC AGENTS (NO CHARGE) OPTIME
TOPICAL | Status: DC | PRN
  Administered 2015-10-13: 1 via TOPICAL

## 2015-10-13 MED ORDER — BENAZEPRIL HCL 20 MG PO TABS
20.0000 mg | ORAL_TABLET | Freq: Every day | ORAL | Status: DC
  Administered 2015-10-14: 20 mg via ORAL
  Filled 2015-10-13: qty 1

## 2015-10-13 MED ORDER — OXYCODONE HCL 5 MG PO TABS
5.0000 mg | ORAL_TABLET | ORAL | Status: DC | PRN
  Administered 2015-10-13 – 2015-10-14 (×3): 10 mg via ORAL
  Administered 2015-10-14: 5 mg via ORAL
  Filled 2015-10-13: qty 1
  Filled 2015-10-13 (×3): qty 2

## 2015-10-13 MED ORDER — ROCURONIUM BROMIDE 50 MG/5ML IV SOLN
INTRAVENOUS | Status: AC
  Filled 2015-10-13: qty 1

## 2015-10-13 MED ORDER — MIDAZOLAM HCL 5 MG/5ML IJ SOLN
INTRAMUSCULAR | Status: DC | PRN
  Administered 2015-10-13: 2 mg via INTRAVENOUS

## 2015-10-13 MED ORDER — NEOSTIGMINE METHYLSULFATE 10 MG/10ML IV SOLN
INTRAVENOUS | Status: DC | PRN
  Administered 2015-10-13: 4 mg via INTRAVENOUS

## 2015-10-13 MED ORDER — ONDANSETRON HCL 4 MG/2ML IJ SOLN
INTRAMUSCULAR | Status: AC
  Filled 2015-10-13: qty 2

## 2015-10-13 MED ORDER — ONDANSETRON HCL 4 MG/2ML IJ SOLN
INTRAMUSCULAR | Status: DC | PRN
  Administered 2015-10-13: 4 mg via INTRAVENOUS

## 2015-10-13 MED ORDER — DOCUSATE SODIUM 100 MG PO CAPS
100.0000 mg | ORAL_CAPSULE | Freq: Two times a day (BID) | ORAL | Status: DC
  Administered 2015-10-13 – 2015-10-14 (×2): 100 mg via ORAL
  Filled 2015-10-13 (×2): qty 1

## 2015-10-13 MED ORDER — ONDANSETRON HCL 4 MG PO TABS: 4.0000 mg | ORAL_TABLET | Freq: Four times a day (QID) | ORAL | Status: DC | PRN

## 2015-10-13 MED ORDER — MORPHINE SULFATE (PF) 2 MG/ML IV SOLN: 2.0000 mg | INTRAVENOUS | Status: DC | PRN

## 2015-10-13 MED ORDER — FLUTICASONE PROPIONATE 50 MCG/ACT NA SUSP
1.0000 | Freq: Every day | NASAL | Status: DC
  Filled 2015-10-13: qty 16

## 2015-10-13 MED ORDER — PROPOFOL 10 MG/ML IV BOLUS
INTRAVENOUS | Status: AC
  Filled 2015-10-13: qty 20

## 2015-10-13 MED ORDER — GLYCOPYRROLATE 0.2 MG/ML IJ SOLN
INTRAMUSCULAR | Status: AC
  Filled 2015-10-13: qty 3

## 2015-10-13 MED ORDER — SODIUM CHLORIDE 0.9 % IV SOLN
INTRAVENOUS | Status: DC
  Administered 2015-10-13: 21:00:00 via INTRAVENOUS

## 2015-10-13 MED ORDER — ALUMINUM HYDROXIDE GEL 320 MG/5ML PO SUSP
15.0000 mL | ORAL | Status: DC | PRN
  Filled 2015-10-13: qty 30

## 2015-10-13 MED ORDER — GLYCOPYRROLATE 0.2 MG/ML IJ SOLN
INTRAMUSCULAR | Status: AC
  Filled 2015-10-13: qty 1

## 2015-10-13 MED ORDER — MIDAZOLAM HCL 2 MG/2ML IJ SOLN
INTRAMUSCULAR | Status: AC
  Filled 2015-10-13: qty 4

## 2015-10-13 MED ORDER — SUCCINYLCHOLINE CHLORIDE 20 MG/ML IJ SOLN
INTRAMUSCULAR | Status: DC | PRN
  Administered 2015-10-13: 120 mg via INTRAVENOUS

## 2015-10-13 MED ORDER — DIPHENHYDRAMINE HCL 12.5 MG/5ML PO ELIX
12.5000 mg | ORAL_SOLUTION | ORAL | Status: DC | PRN
Start: 2015-10-13 — End: 2015-10-14

## 2015-10-13 MED ORDER — CYCLOSPORINE 0.05 % OP EMUL
1.0000 [drp] | Freq: Two times a day (BID) | OPHTHALMIC | Status: DC
  Administered 2015-10-14: 1 [drp] via OPHTHALMIC
  Filled 2015-10-13 (×3): qty 1

## 2015-10-13 MED ORDER — FENTANYL CITRATE (PF) 100 MCG/2ML IJ SOLN
INTRAMUSCULAR | Status: DC | PRN
  Administered 2015-10-13 (×2): 50 ug via INTRAVENOUS

## 2015-10-13 MED ORDER — PHENYLEPHRINE 40 MCG/ML (10ML) SYRINGE FOR IV PUSH (FOR BLOOD PRESSURE SUPPORT)
PREFILLED_SYRINGE | INTRAVENOUS | Status: AC
  Filled 2015-10-13: qty 10

## 2015-10-13 MED ORDER — DEXAMETHASONE SODIUM PHOSPHATE 4 MG/ML IJ SOLN
INTRAMUSCULAR | Status: AC
  Filled 2015-10-13: qty 1

## 2015-10-13 MED ORDER — BISACODYL 5 MG PO TBEC: 5.0000 mg | DELAYED_RELEASE_TABLET | Freq: Every day | ORAL | Status: DC | PRN

## 2015-10-13 MED ORDER — GLYCOPYRROLATE 0.2 MG/ML IJ SOLN
INTRAMUSCULAR | Status: DC | PRN
  Administered 2015-10-13: 0.2 mg via INTRAVENOUS
  Administered 2015-10-13: .5 mg via INTRAVENOUS

## 2015-10-13 MED ORDER — POLYETHYLENE GLYCOL 3350 17 G PO PACK: 17.0000 g | PACK | Freq: Every day | ORAL | Status: DC | PRN

## 2015-10-13 MED ORDER — DOCUSATE SODIUM 100 MG PO CAPS: 100.0000 mg | ORAL_CAPSULE | Freq: Three times a day (TID) | ORAL | Status: DC | PRN

## 2015-10-13 MED ORDER — PHENOL 1.4 % MT LIQD: 1.0000 | OROMUCOSAL | Status: DC | PRN

## 2015-10-13 MED ORDER — ASPIRIN EC 325 MG PO TBEC
325.0000 mg | DELAYED_RELEASE_TABLET | Freq: Two times a day (BID) | ORAL | Status: DC
  Administered 2015-10-13 – 2015-10-14 (×3): 325 mg via ORAL
  Filled 2015-10-13 (×3): qty 1

## 2015-10-13 MED ORDER — PRAVASTATIN SODIUM 40 MG PO TABS
40.0000 mg | ORAL_TABLET | Freq: Every day | ORAL | Status: DC
  Administered 2015-10-13: 40 mg via ORAL
  Filled 2015-10-13: qty 1

## 2015-10-13 MED ORDER — SODIUM CHLORIDE 0.9 % IJ SOLN
INTRAMUSCULAR | Status: AC
  Filled 2015-10-13: qty 10

## 2015-10-13 MED ORDER — FLEET ENEMA 7-19 GM/118ML RE ENEM: 1.0000 | ENEMA | Freq: Once | RECTAL | Status: DC | PRN

## 2015-10-13 MED ORDER — METOCLOPRAMIDE HCL 5 MG/ML IJ SOLN: 5.0000 mg | Freq: Three times a day (TID) | INTRAMUSCULAR | Status: DC | PRN

## 2015-10-13 MED ORDER — NEOSTIGMINE METHYLSULFATE 10 MG/10ML IV SOLN
INTRAVENOUS | Status: AC
  Filled 2015-10-13: qty 1

## 2015-10-13 MED ORDER — LIDOCAINE HCL (CARDIAC) 20 MG/ML IV SOLN
INTRAVENOUS | Status: AC
  Filled 2015-10-13: qty 5

## 2015-10-13 MED ORDER — VECURONIUM BROMIDE 10 MG IV SOLR
INTRAVENOUS | Status: DC | PRN
  Administered 2015-10-13: 1 mg via INTRAVENOUS
  Administered 2015-10-13: 3 mg via INTRAVENOUS

## 2015-10-13 MED ORDER — SUCCINYLCHOLINE CHLORIDE 20 MG/ML IJ SOLN
INTRAMUSCULAR | Status: AC
  Filled 2015-10-13: qty 1

## 2015-10-13 MED ORDER — PROPOFOL 10 MG/ML IV BOLUS
INTRAVENOUS | Status: DC | PRN
  Administered 2015-10-13: 150 mg via INTRAVENOUS

## 2015-10-13 MED ORDER — BUPIVACAINE-EPINEPHRINE (PF) 0.5% -1:200000 IJ SOLN
INTRAMUSCULAR | Status: DC | PRN
  Administered 2015-10-13: 20 mL via PERINEURAL

## 2015-10-13 MED ORDER — SERTRALINE HCL 100 MG PO TABS
100.0000 mg | ORAL_TABLET | Freq: Every day | ORAL | Status: DC
  Administered 2015-10-14: 100 mg via ORAL
  Filled 2015-10-13: qty 1

## 2015-10-13 MED ORDER — FENTANYL CITRATE (PF) 100 MCG/2ML IJ SOLN: 25.0000 ug | INTRAMUSCULAR | Status: DC | PRN

## 2015-10-13 MED ORDER — MENTHOL 3 MG MT LOZG: 1.0000 | LOZENGE | OROMUCOSAL | Status: DC | PRN

## 2015-10-13 MED ORDER — AMLODIPINE BESYLATE 5 MG PO TABS
5.0000 mg | ORAL_TABLET | Freq: Every morning | ORAL | Status: DC
  Administered 2015-10-14: 5 mg via ORAL
  Filled 2015-10-13: qty 1

## 2015-10-13 MED ORDER — PROMETHAZINE HCL 25 MG/ML IJ SOLN: 6.2500 mg | INTRAMUSCULAR | Status: DC | PRN

## 2015-10-13 MED ORDER — ONDANSETRON HCL 4 MG/2ML IJ SOLN: 4.0000 mg | Freq: Four times a day (QID) | INTRAMUSCULAR | Status: DC | PRN

## 2015-10-13 SURGICAL SUPPLY — 69 items
BIT DRILL 5/64X5 DISP (BIT) IMPLANT
BLADE SAW SAG 73X25 THK (BLADE) ×2
BLADE SAW SGTL 73X25 THK (BLADE) ×1 IMPLANT
BLADE SURG 15 STRL LF DISP TIS (BLADE) ×1 IMPLANT
BLADE SURG 15 STRL SS (BLADE) ×3
BROCHE GUIDE WIRE ×3 IMPLANT
CAP SHOULDER TOTAL 2 ×3 IMPLANT
CEMENT BONE DEPUY (Cement) ×3 IMPLANT
CHLORAPREP W/TINT 26ML (MISCELLANEOUS) ×3 IMPLANT
CLOSURE STERI-STRIP 1/2X4 (GAUZE/BANDAGES/DRESSINGS) ×1
CLOSURE WOUND 1/2 X4 (GAUZE/BANDAGES/DRESSINGS) ×1
CLSR STERI-STRIP ANTIMIC 1/2X4 (GAUZE/BANDAGES/DRESSINGS) ×2 IMPLANT
COVER MAYO STAND STRL (DRAPES) ×3 IMPLANT
COVER SURGICAL LIGHT HANDLE (MISCELLANEOUS) ×3 IMPLANT
DRAPE IMP U-DRAPE 54X76 (DRAPES) ×3 IMPLANT
DRAPE INCISE IOBAN 66X45 STRL (DRAPES) ×6 IMPLANT
DRAPE ORTHO SPLIT 77X108 STRL (DRAPES) ×6
DRAPE SURG 17X23 STRL (DRAPES) ×3 IMPLANT
DRAPE SURG ORHT 6 SPLT 77X108 (DRAPES) ×2 IMPLANT
DRAPE U-SHAPE 47X51 STRL (DRAPES) ×3 IMPLANT
DRSG AQUACEL AG ADV 3.5X10 (GAUZE/BANDAGES/DRESSINGS) ×3 IMPLANT
ELECT BLADE 4.0 EZ CLEAN MEGAD (MISCELLANEOUS)
ELECT REM PT RETURN 9FT ADLT (ELECTROSURGICAL) ×3
ELECTRODE BLDE 4.0 EZ CLN MEGD (MISCELLANEOUS) IMPLANT
ELECTRODE REM PT RTRN 9FT ADLT (ELECTROSURGICAL) ×1 IMPLANT
EVACUATOR 1/8 PVC DRAIN (DRAIN) IMPLANT
GLOVE BIO SURGEON STRL SZ7 (GLOVE) ×3 IMPLANT
GLOVE BIO SURGEON STRL SZ7.5 (GLOVE) ×3 IMPLANT
GLOVE BIOGEL PI IND STRL 7.0 (GLOVE) ×1 IMPLANT
GLOVE BIOGEL PI IND STRL 8 (GLOVE) ×1 IMPLANT
GLOVE BIOGEL PI INDICATOR 7.0 (GLOVE) ×2
GLOVE BIOGEL PI INDICATOR 8 (GLOVE) ×2
GOWN STRL REUS W/ TWL LRG LVL3 (GOWN DISPOSABLE) ×1 IMPLANT
GOWN STRL REUS W/ TWL XL LVL3 (GOWN DISPOSABLE) ×1 IMPLANT
GOWN STRL REUS W/TWL LRG LVL3 (GOWN DISPOSABLE) ×3
GOWN STRL REUS W/TWL XL LVL3 (GOWN DISPOSABLE) ×3
HANDPIECE INTERPULSE COAX TIP (DISPOSABLE) ×3
HEMOSTAT SURGICEL 2X14 (HEMOSTASIS) ×3 IMPLANT
HOOD PEEL AWAY FACE SHEILD DIS (HOOD) ×6 IMPLANT
KIT BASIN OR (CUSTOM PROCEDURE TRAY) ×3 IMPLANT
KIT ROOM TURNOVER OR (KITS) ×3 IMPLANT
MANIFOLD NEPTUNE II (INSTRUMENTS) ×3 IMPLANT
NEEDLE HYPO 25GX1X1/2 BEV (NEEDLE) IMPLANT
NEEDLE MAYO TROCAR (NEEDLE) ×3 IMPLANT
NS IRRIG 1000ML POUR BTL (IV SOLUTION) ×3 IMPLANT
PACK SHOULDER (CUSTOM PROCEDURE TRAY) ×3 IMPLANT
PAD ARMBOARD 7.5X6 YLW CONV (MISCELLANEOUS) ×6 IMPLANT
RETRIEVER SUT HEWSON (MISCELLANEOUS) ×3 IMPLANT
SET HNDPC FAN SPRY TIP SCT (DISPOSABLE) ×1 IMPLANT
SLING ARM IMMOBILIZER LRG (SOFTGOODS) ×3 IMPLANT
SLING ARM IMMOBILIZER MED (SOFTGOODS) IMPLANT
SMARTMIX MINI TOWER (MISCELLANEOUS) ×3
SPONGE LAP 18X18 X RAY DECT (DISPOSABLE) ×3 IMPLANT
SPONGE LAP 4X18 X RAY DECT (DISPOSABLE) IMPLANT
STRIP CLOSURE SKIN 1/2X4 (GAUZE/BANDAGES/DRESSINGS) ×2 IMPLANT
SUCTION FRAZIER TIP 10 FR DISP (SUCTIONS) ×3 IMPLANT
SUPPORT WRAP ARM LG (MISCELLANEOUS) ×3 IMPLANT
SUT ETHIBOND NAB CT1 #1 30IN (SUTURE) ×3 IMPLANT
SUT MNCRL AB 4-0 PS2 18 (SUTURE) ×3 IMPLANT
SUT SILK 2 0 TIES 17X18 (SUTURE)
SUT SILK 2-0 18XBRD TIE BLK (SUTURE) IMPLANT
SUT VIC AB 0 CTB1 27 (SUTURE) IMPLANT
SUT VIC AB 2-0 CT1 27 (SUTURE) ×3
SUT VIC AB 2-0 CT1 TAPERPNT 27 (SUTURE) ×1 IMPLANT
SYR CONTROL 10ML LL (SYRINGE) IMPLANT
TAPE FIBER 2MM 7IN #2 BLUE (SUTURE) ×9 IMPLANT
TOWEL OR 17X24 6PK STRL BLUE (TOWEL DISPOSABLE) ×3 IMPLANT
TOWEL OR 17X26 10 PK STRL BLUE (TOWEL DISPOSABLE) ×3 IMPLANT
TOWER SMARTMIX MINI (MISCELLANEOUS) ×1 IMPLANT

## 2015-10-13 NOTE — Discharge Instructions (Signed)

## 2015-10-13 NOTE — Anesthesia Postprocedure Evaluation (Signed)
  Anesthesia Post-op Note  Patient: Jane Montgomery  Procedure(s) Performed: Procedure(s) (LRB): TOTAL SHOULDER ARTHROPLASTY (Right)  Patient Location: PACU  Anesthesia Type: GA combined with regional for post-op pain  Level of Consciousness: awake and alert   Airway and Oxygen Therapy: Patient Spontanous Breathing  Post-op Pain: mild  Post-op Assessment: Post-op Vital signs reviewed, Patient's Cardiovascular Status Stable, Respiratory Function Stable, Patent Airway and No signs of Nausea or vomiting  Last Vitals:  Filed Vitals:   10/13/15 1315  BP: 129/60  Pulse: 91  Temp: 36.8 C  Resp: 20    Post-op Vital Signs: stable   Complications: No apparent anesthesia complications

## 2015-10-13 NOTE — Anesthesia Procedure Notes (Addendum)
Procedure Name: Intubation Date/Time: 10/13/2015 7:40 AM Performed by: Jenne Campus Pre-anesthesia Checklist: Patient identified, Emergency Drugs available, Suction available, Patient being monitored and Timeout performed Patient Re-evaluated:Patient Re-evaluated prior to inductionOxygen Delivery Method: Circle system utilized Preoxygenation: Pre-oxygenation with 100% oxygen Intubation Type: IV induction Ventilation: Mask ventilation without difficulty Laryngoscope Size: Miller and 2 Grade View: Grade II Tube type: Oral Tube size: 7.0 mm Number of attempts: 1 Airway Equipment and Method: Stylet Placement Confirmation: ETT inserted through vocal cords under direct vision,  positive ETCO2,  CO2 detector and breath sounds checked- equal and bilateral Secured at: 21 cm Tube secured with: Tape Dental Injury: Teeth and Oropharynx as per pre-operative assessment     Anesthesia Regional Block:  Interscalene brachial plexus block  Pre-Anesthetic Checklist: ,, timeout performed, Correct Patient, Correct Site, Correct Laterality, Correct Procedure, Correct Position, site marked, Risks and benefits discussed,  Surgical consent,  Pre-op evaluation,  At surgeon's request and post-op pain management  Laterality: Right  Prep: chloraprep       Needles:  Injection technique: Single-shot  Needle Type: Echogenic Stimulator Needle     Needle Length: 5cm 5 cm Needle Gauge: 22 and 22 G    Additional Needles:  Procedures: ultrasound guided (picture in chart) Interscalene brachial plexus block Narrative:  Injection made incrementally with aspirations every 5 mL.  Performed by: Personally  Anesthesiologist: Catalina Gravel  Additional Notes: Functioning IV was confirmed and monitors were applied.  A 61mm 22ga Arrow echogenic stimulator needle was used. Sterile prep and drape,hand hygiene and sterile gloves were used.  Negative aspiration and negative test dose prior to incremental  administration of local anesthetic. The patient tolerated the procedure well.  Ultrasound guidance: relevent anatomy identified, needle position confirmed, local anesthetic spread visualized around nerve(s), vascular puncture avoided.  Image printed for medical record.

## 2015-10-13 NOTE — Anesthesia Preprocedure Evaluation (Addendum)
Anesthesia Evaluation  Patient identified by MRN, date of birth, ID band Patient awake    Reviewed: Allergy & Precautions, NPO status , Patient's Chart, lab work & pertinent test results  Airway Mallampati: III  TM Distance: >3 FB Neck ROM: Full    Dental  (+) Teeth Intact, Dental Advisory Given   Pulmonary sleep apnea ,    Pulmonary exam normal breath sounds clear to auscultation       Cardiovascular hypertension, Pt. on medications (-) angina(-) Past MI negative cardio ROS Normal cardiovascular exam+ Valvular Problems/Murmurs  Rhythm:Regular Rate:Normal     Neuro/Psych PSYCHIATRIC DISORDERS Depression negative neurological ROS     GI/Hepatic negative GI ROS, Neg liver ROS,   Endo/Other  diabetesMorbid obesity  Renal/GU negative Renal ROS     Musculoskeletal  (+) Arthritis , Osteoarthritis,    Abdominal   Peds  Hematology negative hematology ROS (+)   Anesthesia Other Findings Day of surgery medications reviewed with the patient.  Reproductive/Obstetrics                          Anesthesia Physical Anesthesia Plan  ASA: III  Anesthesia Plan: General   Post-op Pain Management: GA combined w/ Regional for post-op pain   Induction: Intravenous  Airway Management Planned: Oral ETT  Additional Equipment:   Intra-op Plan:   Post-operative Plan: Extubation in OR  Informed Consent: I have reviewed the patients History and Physical, chart, labs and discussed the procedure including the risks, benefits and alternatives for the proposed anesthesia with the patient or authorized representative who has indicated his/her understanding and acceptance.   Dental advisory given  Plan Discussed with: CRNA  Anesthesia Plan Comments: (Risks/benefits of general anesthesia discussed with patient including risk of damage to teeth, lips, gum, and tongue, nausea/vomiting, allergic reactions to  medications, and the possibility of heart attack, stroke and death.  All patient questions answered.  Patient wishes to proceed.)        Anesthesia Quick Evaluation

## 2015-10-13 NOTE — Op Note (Signed)
Procedure(s): TOTAL SHOULDER ARTHROPLASTY Procedure Note  Jane Montgomery female 65 y.o. 10/13/2015  Procedure(s) and Anesthesia Type:    * RIGHT TOTAL SHOULDER ARTHROPLASTY - Choice  Surgeon(s) and Role:    * [name], MD - Primary   Indications:  65 y.o. female  With endstage right shoulder arthritis. Pain and dysfunction interfered with quality of life and nonoperative treatment with activity modification, NSAIDS and injections failed.     Surgeon: Jane Montgomery   Assistants: Jeanmarie Hubert PA-C [facility] was present and scrubbed throughout the procedure and was essential in positioning, retraction, exposure, and closure)  Anesthesia: General endotracheal anesthesia with preoperative interscalene block given by the attending anesthesiologist    Procedure Detail  TOTAL SHOULDER ARTHROPLASTY  Findings: Tornier flex anatomic press-fit size 3 stem with a 43 high offset head, cemented size small, 40 Cortiloc glenoid.   A lesser tuberosity osteotomy was performed and repaired at the conclusion of the procedure. She did receive TXA at the beginning of the procedure.  Estimated Blood Loss:  200 mL         Drains: None   Blood Given: none          Specimens: none        Complications:  * No complications entered in OR log *         Disposition: PACU - hemodynamically stable.         Condition: stable    Procedure:   The patient was identified in the preoperative holding area where I personally marked the operative extremity after verifying with the patient and consent. She  was taken to the operating room where She was transferred to the   operative table.  The patient received an interscalene block in   the holding area by the attending anesthesiologist.  General anesthesia was induced   in the operating room without complication.  The patient did receive IV  Ancef prior to the commencement of the procedure.  The patient was   placed in the  beach-chair position with the back raised about 30   degrees.  The nonoperative extremity and head and neck were carefully   positioned and padded protecting against neurovascular compromise.  The   left upper extremity was then prepped and draped in the standard sterile   fashion.    The appropriate operative time-out was performed with   Anesthesia, the perioperative staff, as well as myself and we all agreed   that the right side was the correct operative site. The patient was given 1g tranexamic acid by the anesthesia team at the beginning of the case. An approximately   10 cm incision was made from the tip of the coracoid to the center point of the   humerus at the level of the axilla.  Dissection was carried down sharply   through subcutaneous tissues and cephalic vein was identified and taken   laterally with the deltoid.  The pectoralis major was taken medially.  The   upper 1 cm of the pectoralis major was released from its attachment on   the humerus.  The clavipectoral fascia was incised just lateral to the   conjoined tendon.  This incision was carried up to but not into the   coracoacromial ligament.  Digital palpation was used to prove   integrity of the axillary nerve which was protected throughout the   procedure.  Musculocutaneous nerve was not palpated in the operative   field.  Conjoined tendon was then retracted  gently medially and the   deltoid laterally.  Anterior circumflex humeral vessels were clamped and   coagulated.  The soft tissues overlying the biceps was incised and this   incision was carried across the transverse humeral ligament to the base   of the coracoid.  The biceps was tenodesed to the soft tissue just above   pectoralis major and the remaining portion of the biceps superiorly was   excised.  An osteotomy was performed at the lesser tuberosity.  Capsule was then   released all the way down to the 6 o'clock position of the humeral head.   The humeral  head was then delivered with simultaneous adduction,   extension and external rotation.  All humeral osteophytes were removed   and the anatomic neck of the humerus was marked and cut free hand at   approximately 25 degrees retroversion within about 3 mm of the cuff   reflection posteriorly.  The head size was estimated to be a 43 medium   offset.  At that point, the humeral head was retracted posteriorly with   a Fukuda retractor.   Remaining portion of the capsule was released at the base of the   coracoid.  The remaining biceps anchor and the entire anterior-inferior   labrum was excised.  The posterior labrum was also excised but the   posterior capsule was not released.  The guidepin was placed bicortically with a small +0 elevated guide.  The reamer was used to ream to concentric bone with punctate bleeding.  This gave an excellent concentric surface.  The center hole was then drilled for an anchor peg glenoid followed by the three peripheral holes and none of the holes   exited the glenoid wall.  I then pulse irrigated these holes and dried   them with Surgicel.  The three peripheral holes were then   pressurized cemented and the anchor peg glenoid was placed and impacted   with an excellent fit.  The glenoid was a small, 40 component.  The proximal humerus was then again exposed taking care not to displace the glenoid.    The entry awl was used followed by sounding reamers and then sequentially broached from size 1-3. This was then left in place and the calcar planer was used. Trial head was placed with a 43 high offset.  With the trial implantation of the component,  there was approximately 50% posterior translation with immediate snap back to the   anatomic position.  With forward elevation, there was no tendency   towards posterior subluxation.   The trial was removed and the final implant was prepared on a back table.  The trial was removed and the final implant was prepared on a back  table.   3 small holes were drilled on the medial side of the lesser tuberosity osteotomy, through which 2 labral tapes were passed. The implant was then placed through the loop of the 2 labral tapes and impacted with an excellent press-fit. This achieved excellent anatomic reconstruction of the proximal humerus.  The joint was then copiously irrigated with pulse lavage.  The subscapularis and   lesser tuberosity osteotomy were then repaired using the 2 labral tapes previously passed in a double row fashion with horizontal mattress sutures medially brought over through bone tunnels tied over a bone bridge laterally.   One #1 Ethibond was placed at the rotator interval just above   the lesser tuberosity. Copious irrigation was used. Skin was closed with 2-0  Vicryl sutures in the deep dermal layer and 4-0 Monocryl in a subcuticular  running fashion.  Sterile dressings were then applied including Aquacel.  The patient was placed in a sling and allowed to awaken from general anesthesia and taken to the recovery room in stable condition.      POSTOPERATIVE PLAN:  Early passive range of motion will be allowed with the goal of 40 degrees external rotation and 140 degrees forward elevation.  No internal rotation at this time.  No active motion of the arm until the lesser tuberosity heals.  The patient will likely be kept in the hospital for 1-2 days and then discharged home.

## 2015-10-13 NOTE — H&P (Signed)
Jane Montgomery is an 65 y.o. female.   Chief Complaint: R shoulder pain and dysfunction HPI: Endstage R shoulder arthritis with severe pain limiting sleep and quality of life.  Failed conservative treatment.  Past Medical History  Diagnosis Date  . Hyperlipidemia     takes Pravastatin daily  . 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 Tremor, essential     Dr Erling Cruz  . Gilbert syndrome   . Benign positional vertigo     in LLDP  . Depression     takes Zoloft daily  . Hypertension     takes Amlodipine daily as well as Benazepril  . Shortness of breath dyspnea     allergy related and occasionally will take Claritin.  . Arthritis   . Joint pain   . Chronic back pain     reason unknown   . Diarrhea   . Diverticulosis   . Nocturia   . Gestational diabetes 1982, 1984    "pre"  . Macular degeneration     early stages and dry    Past Surgical History  Procedure Laterality Date  . Plantar fascia surgery Bilateral   . Cesarean section      x 2  . Dilation and curettage of uterus      Dr Irven [place]  . Colonoscopy  [phone]    Negative, redundant colon; [facility]  . Tonsillectomy    . Cataract extraction Bilateral 2015    Family History  Problem Relation Age of Onset  . Hypertension Father   . Heart attack Father 4    CABG   . Colon cancer Mother 63  . Macular degeneration Mother   . Hypertension Mother   . Seizures Mother     ? etiology (preceded CVA)  . Stroke Mother 69  . Osteoporosis Mother   . Myelodysplastic syndrome Mother   . Colon cancer Maternal Grandmother   . Colon cancer Maternal Aunt   . Colon cancer      2 Maternal Great Aunts  . Heart attack Paternal Grandfather     > 55  . Heart attack Paternal Aunt 33  . Leukemia Paternal Grandmother   . Diabetes Neg Hx    Social History:  reports that she has never smoked. She has never used smokeless tobacco. She reports that she does not drink alcohol or use illicit drugs.  Allergies: No Known  Allergies  Medications Prior to Admission  Medication Sig Dispense Refill  . amLODipine (NORVASC) 5 MG tablet Take 1 tablet (5 mg total) by mouth every morning. 90 tablet 2  . aspirin 81 MG tablet Take 81 mg by mouth daily.      . benazepril (LOTENSIN) 20 MG tablet Take 1 tablet (20 mg total) by mouth daily. 90 tablet 2  . Biotin 7500 MCG TABS Take 1 tablet by mouth daily.    . Coenzyme Q10 (COQ10) 200 MG CAPS Take 400 mg by mouth daily.    . fluticasone (FLONASE) 50 MCG/ACT nasal spray Place 1 spray into both nostrils daily.    Marland Kitchen ibuprofen (ADVIL,MOTRIN) 200 MG tablet Take 400-800 mg by mouth every 6 (six) hours as needed for moderate pain.     Marland Kitchen MOVIPREP 100 G SOLR (no substitutions)-TAKE AS DIRECTED. 1 kit 0  . Multiple Vitamins-Minerals (ICAPS PO) Take 1 tablet by mouth 2 (two) times daily.     . pravastatin (PRAVACHOL) 40 MG tablet Take 1 tablet (40 mg total) by mouth at  bedtime. 90 tablet 2  . RESTASIS 0.05 % ophthalmic emulsion Place 1 drop into both eyes 2 (two) times daily.  4  . sertraline (ZOLOFT) 100 MG tablet TAKE 1 TABLET BY MOUTH EVERY DAY 90 tablet 0  . Forskolin POWD Take 1 capsule by mouth daily.    Marland Kitchen GARCINIA CAMBOGIA-CHROMIUM PO Take 2 capsules by mouth daily.      No results found for this or any previous visit (from the past 48 hour(s)). No results found.  Review of Systems  All other systems reviewed and are negative.   Blood pressure 141/72, pulse 68, temperature 97.7 F (36.5 C), temperature source Oral, resp. rate 16, height 5' 2"  (1.575 m), weight 126.281 kg (278 lb 6.4 oz), SpO2 96 %. Physical Exam  Constitutional: She is oriented to person, place, and time. She appears well-developed and well-nourished.  HENT:  Head: Atraumatic.  Eyes: EOM are normal.  Cardiovascular: Intact distal pulses.   Respiratory: Effort normal.  Musculoskeletal:  R shoulder pain with limited ROM.  Neurological: She is alert and oriented to person, place, and time.  Skin:  Skin is warm and dry.  Psychiatric: She has a normal mood and affect.     Assessment/Plan R shoulder endstage osteoarthritis Plan R TSA Risks / benefits of surgery discussed Consent on chart  NPO for OR Preop antibiotics   Ruthmary Occhipinti WILLIAM 10/13/2015, 7:23 AM

## 2015-10-13 NOTE — Transfer of Care (Signed)
Immediate Anesthesia Transfer of Care Note  Patient: Jane Montgomery  Procedure(s) Performed: Procedure(s) with comments: TOTAL SHOULDER ARTHROPLASTY (Right) - Right total shoulder arthroplasty  Patient Location: PACU  Anesthesia Type:General  Level of Consciousness: sedated and patient cooperative  Airway & Oxygen Therapy: Patient Spontanous Breathing and Patient connected to face mask oxygen  Post-op Assessment: Report given to RN and Post -op Vital signs reviewed and stable  Post vital signs: Reviewed  Last Vitals:  Filed Vitals:   10/13/15 0609  BP: 141/72  Pulse: 68  Temp: 36.5 C  Resp: 16    Complications: No apparent anesthesia complications

## 2015-10-14 ENCOUNTER — Encounter (HOSPITAL_COMMUNITY): Payer: Self-pay | Admitting: Orthopedic Surgery

## 2015-10-14 LAB — CBC
HEMATOCRIT: 34.5 % — AB (ref 36.0–46.0)
HEMOGLOBIN: 10.4 g/dL — AB (ref 12.0–15.0)
MCH: 27.3 pg (ref 26.0–34.0)
MCHC: 30.1 g/dL (ref 30.0–36.0)
MCV: 90.6 fL (ref 78.0–100.0)
Platelets: 163 10*3/uL (ref 150–400)
RBC: 3.81 MIL/uL — AB (ref 3.87–5.11)
RDW: 15.6 % — ABNORMAL HIGH (ref 11.5–15.5)
WBC: 9.3 10*3/uL (ref 4.0–10.5)

## 2015-10-14 NOTE — Progress Notes (Signed)
Pt ready for d/c per MD. Cleared by OT, has not equipment needs. Discharge teaching and prescriptions given to pt and husband at bedside, all questions answered. Peripheral IV removed; belongings gathered and removed by husband. Wheeled out by Psychologist, occupational.  Jane Montgomery G  10/14/2015 1:00 PM

## 2015-10-14 NOTE — Evaluation (Signed)
Occupational Therapy Evaluation Patient Details Name: Jane Montgomery MRN: [id number] DOB: 1950/03/06 Today's Date: 10/14/2015    History of Present Illness TOTAL SHOULDER ARTHROPLASTY (Right)   Clinical Impression   Pt reports she was independent with ADLs PTA. Currently pt is min assist overall for ADLs and min guard for functional mobility. Educated pt on UB bathing/dressing technique, edema management, sling management and wear schedule, UE positioning; pt verbalized understanding. Provided pt with shoulder precautions and exercise handout; asked pt to read over and therapist will check back this PM to go over exercises and answer any questions. Pt to d/c home with 24/7 supervision from her husband. Pt would benefit from continued skilled OT in order to maximize independence and safety with UB bathing and dressing, LB dressing, and HEP prior to d/c home.     Follow Up Recommendations  Supervision/Assistance - 24 hour;Other (comment) (follow up per MD)    Equipment Recommendations  None recommended by OT    Recommendations for Other Services       Precautions / Restrictions Precautions Precautions: Shoulder;Fall Type of Shoulder Precautions: Passive protocol: PROM gentle ER to 40, FF 140, no ABD. AROM elbow, wrist, hand OK. Shoulder Interventions: Shoulder sling/immobilizer;At all times;Off for dressing/bathing/exercises Precaution Booklet Issued: Yes (comment) Required Braces or Orthoses: Sling Restrictions Weight Bearing Restrictions: Yes RUE Weight Bearing: Non weight bearing      Mobility Bed Mobility Overal bed mobility: Needs Assistance Bed Mobility: Supine to Sit     Supine to sit: Supervision;HOB elevated        Transfers Overall transfer level: Needs assistance Equipment used: None Transfers: Sit to/from Stand Sit to Stand: Min guard              Balance Overall balance assessment: Needs assistance         Standing balance support: Single  extremity supported Standing balance-Leahy Scale: Fair                              ADL Overall ADL's : Needs assistance/impaired                                     Functional mobility during ADLs: Min guard General ADL Comments: No family present for OT eval. Pt is min assit overall for ADLs and min guard for functional mobility. Educated pt on UB dressing and bathing techniques, edema management techniques, sling management and wear schedule, precautions, positioning of UE; pt verbalized understainding. Provided pt with shoudler d/c and exercise handout; asked pt to look over and therapist will be back later today to review exercises.      Vision     Perception     Praxis      Pertinent Vitals/Pain Pain Assessment: 0-10 Pain Score: 6  Pain Location: R shoulder Pain Descriptors / Indicators: Aching;Sore Pain Intervention(s): Limited activity within patient's tolerance;Monitored during session;Repositioned;Ice applied     Hand Dominance Right   Extremity/Trunk Assessment Upper Extremity Assessment Upper Extremity Assessment: RUE deficits/detail RUE Deficits / Details: Wrist, hand ROM/MMT WFL RUE: Unable to fully assess due to pain;Unable to fully assess due to immobilization   Lower Extremity Assessment Lower Extremity Assessment: Overall WFL for tasks assessed       Communication Communication Communication: No difficulties   Cognition Arousal/Alertness: Awake/alert Behavior During Therapy: WFL for tasks assessed/performed Overall Cognitive Status: Within Functional Limits  for tasks assessed                     General Comments       Exercises Exercises: Shoulder     Shoulder Instructions Shoulder Instructions Donning/doffing shirt without moving shoulder: Minimal assistance (Education completed) Method for sponge bathing under operated UE: Minimal assistance (Education completed) Donning/doffing sling/immobilizer: Minimal  assistance (Education completed) Correct positioning of sling/immobilizer: Minimal assistance (Education completed) Sling wearing schedule (on at all times/off for ADL's): Minimal assistance (Education completed) Proper positioning of operated UE when showering: Minimal assistance (Education completed) Positioning of UE while sleeping: Minimal assistance (Education completed)    Home Living Family/patient expects to be discharged to:: Private residence Living Arrangements: Spouse/significant other Available Help at Discharge: Family;Available 24 hours/day Type of Home: House Home Access: Stairs to enter [employer] of Steps: 3   Home Layout: One level     Bathroom Shower/Tub: Tub/shower unit;Walk-in shower   Bathroom Toilet: Standard     Home Equipment: Cane - single point;Shower seat          Prior Functioning/Environment Level of Independence: Independent             OT Diagnosis: Generalized weakness;Acute pain   OT Problem List: Decreased strength;Decreased range of motion;Decreased knowledge of use of DME or AE;Decreased safety awareness;Decreased knowledge of precautions;Pain   OT Treatment/Interventions: Self-care/ADL training;Therapeutic exercise;Patient/family education    OT Goals(Current goals can be found in the care plan section) Acute Rehab OT Goals Patient Stated Goal: to go home later today OT Goal Formulation: With patient Time For Goal Achievement: 10/28/15 Potential to Achieve Goals: Good ADL Goals Pt Will Perform Upper Body Bathing: with modified independence;sitting (adhering to shoulder precautions) Pt Will Perform Upper Body Dressing: with modified independence;sitting (adhering to shoulder precautions) Pt Will Perform Lower Body Dressing: with supervision;sit to/from stand (adhering to shoulder precautions) Pt/caregiver will Perform Home Exercise Program: Increased ROM;Right Upper extremity;Independently;With written HEP provided   OT Frequency: Min 3X/week   Barriers to D/C:            Co-evaluation              End of Session Equipment Utilized During Treatment: Other (comment) (sling) Nurse Communication: Mobility status;Weight bearing status;Other (comment) (OT needs to see pt one more time before d/c)  Activity Tolerance: Patient tolerated treatment well Patient left: in chair;with call bell/phone within reach   Time: 0916-0931 OT Time Calculation (min): 15 min Charges:  OT General Charges $OT Visit: 1 Procedure OT Evaluation $Initial OT Evaluation Tier I: 1 Procedure G-Codes:     Binnie Kand M.S., OTR/L Pager: [phone]  10/14/2015, 9:44 AM

## 2015-10-14 NOTE — Discharge Summary (Signed)
Patient ID: MAXIMA SKELTON MRN: [id number] DOB/AGE: 01/10/1950 65 y.o.  Admit date: 10/13/2015 Discharge date: 10/14/2015  Admission Diagnoses:  Active Problems:   Osteoarthritis of right shoulder   Discharge Diagnoses:  Same  Past Medical History  Diagnosis Date  . Hyperlipidemia     takes Pravastatin daily  . 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 Tremor, essential     Dr Erling Cruz  . Gilbert syndrome   . Benign positional vertigo     in LLDP  . Depression     takes Zoloft daily  . Hypertension     takes Amlodipine daily as well as Benazepril  . Shortness of breath dyspnea     allergy related and occasionally will take Claritin.  . Arthritis   . Joint pain   . Chronic back pain     reason unknown   . Diarrhea   . Diverticulosis   . Nocturia   . Gestational diabetes 1982, 1984    "pre"  . Macular degeneration     early stages and dry    Surgeries: Procedure(s): TOTAL SHOULDER ARTHROPLASTY on 10/13/2015   Consultants:    Discharged Condition: Improved  Hospital Course: JANEQUA KIPNIS is an 65 y.o. female who was admitted 10/13/2015 for operative treatment of end stage osteoarthritis of the right shoulder. Patient has severe unremitting pain that affects sleep, daily activities, and work/hobbies. After pre-op clearance the patient was taken to the operating room on 10/13/2015 and underwent  Procedure(s): TOTAL SHOULDER ARTHROPLASTY.    Patient was given perioperative antibiotics: Anti-infectives    Start     Dose/Rate Route Frequency Ordered Stop   10/13/15 1400  ceFAZolin (ANCEF) IVPB 2 g/50 mL premix     2 g 100 mL/hr over 30 Minutes Intravenous Every 6 hours 10/13/15 1315 10/14/15 0050   10/13/15 0700  ceFAZolin (ANCEF) 3 g in dextrose 5 % 50 mL IVPB     3 g 160 mL/hr over 30 Minutes Intravenous To [facility] 10/12/15 1346 10/13/15 0744       Patient was given sequential compression devices, early ambulation, and ASA 325mg  BID to prevent  DVT.  Patient benefited maximally from hospital stay and there were no complications.    Recent vital signs: Patient Vitals for the past 24 hrs:  BP Temp Temp src Pulse Resp SpO2  10/14/15 0610 (!) 128/56 mmHg 98.6 F (37 C) Oral 89 18 95 %  10/13/15 2100 (!) 122/53 mmHg 99.1 F (37.3 C) Oral 98 18 94 %  10/13/15 1315 129/60 mmHg 98.2 F (36.8 C) - 91 20 92 %  10/13/15 1300 - - - 89 (!) 23 93 %  10/13/15 1245 - 98 F (36.7 C) - - - -  10/13/15 1215 - - - 81 18 93 %  10/13/15 1200 (!) 142/67 mmHg - - 86 19 90 %  10/13/15 1145 139/74 mmHg - - 84 (!) 21 91 %  10/13/15 1130 127/70 mmHg - - 87 17 91 %  10/13/15 1115 139/71 mmHg - - 83 (!) 22 92 %  10/13/15 1100 138/79 mmHg - - 81 18 92 %  10/13/15 1045 (!) 147/75 mmHg - - 85 16 93 %  10/13/15 1030 (!) 149/71 mmHg - - 87 11 90 %  10/13/15 1015 (!) 168/87 mmHg 98 F (36.7 C) - 89 10 92 %     Recent laboratory studies:  Recent Labs  10/14/15 0414  WBC 9.3  HGB 10.4*  HCT 34.5*  PLT 163     Discharge Medications:     Medication List    TAKE these medications        docusate sodium 100 MG capsule  Commonly known as:  COLACE  Take 1 capsule (100 mg total) by mouth 3 (three) times daily as needed.     oxyCODONE-acetaminophen 5-325 MG tablet  Commonly known as:  ROXICET  Take 1-2 tablets by mouth every 4 (four) hours as needed for severe pain.      ASK your doctor about these medications        amLODipine 5 MG tablet  Commonly known as:  NORVASC  Take 1 tablet (5 mg total) by mouth every morning.     aspirin 81 MG tablet  Take 81 mg by mouth daily.     benazepril 20 MG tablet  Commonly known as:  LOTENSIN  Take 1 tablet (20 mg total) by mouth daily.     Biotin 7500 MCG Tabs  Take 1 tablet by mouth daily.     CoQ10 200 MG Caps  Take 400 mg by mouth daily.     fluticasone 50 MCG/ACT nasal spray  Commonly known as:  FLONASE  Place 1 spray into both nostrils daily.     Forskolin Powd  Take 1 capsule by  mouth daily.     GARCINIA CAMBOGIA-CHROMIUM PO  Take 2 capsules by mouth daily.     ibuprofen 200 MG tablet  Commonly known as:  ADVIL,MOTRIN  Take 400-800 mg by mouth every 6 (six) hours as needed for moderate pain.     ICAPS PO  Take 1 tablet by mouth 2 (two) times daily.     MOVIPREP 100 G Solr  Generic drug:  peg 3350 powder  (no substitutions)-TAKE AS DIRECTED.     pravastatin 40 MG tablet  Commonly known as:  PRAVACHOL  Take 1 tablet (40 mg total) by mouth at bedtime.     RESTASIS 0.05 % ophthalmic emulsion  Generic drug:  cycloSPORINE  Place 1 drop into both eyes 2 (two) times daily.     sertraline 100 MG tablet  Commonly known as:  ZOLOFT  TAKE 1 TABLET BY MOUTH EVERY DAY        Diagnostic Studies: Dg Chest 2 View  10/04/2015  CLINICAL DATA:  Preoperative examination prior to right total shoulder arthroplasty, history of diabetes heart murmur, nonsmoker. EXAM: CHEST  2 VIEW COMPARISON:  None in PACs FINDINGS: The lungs are adequately inflated. There is no focal infiltrate. The interstitial markings are coarse especially in the left perihilar region. The heart is top-normal in size. The pulmonary vascularity is normal. There is tortuosity of the descending thoracic aorta. There is mild multilevel degenerative disc disease of the thoracic spine. IMPRESSION: Mild cardiomegaly without CHF. Coarse interstitial lung markings likely reflect chronic bronchitic changes. Electronically Signed   By: David  Martinique M.D.   On: 10/04/2015 10:48   Ct Shoulder Right Wo Contrast  09/23/2015  CLINICAL DATA:  RIGHT shoulder pain. RIGHT shoulder osteoarthritis. Preoperative evaluation for RIGHT shoulder arthroplasty. EXAM: CT OF THE RIGHT SHOULDER WITHOUT CONTRAST TECHNIQUE: Multidetector CT imaging was performed according to the standard protocol. Multiplanar CT image reconstructions were also generated. COMPARISON:  None. FINDINGS: Severe RIGHT glenohumeral osteoarthritis is present. Large  glenohumeral effusion and/ synovitis. Fluid attenuation structure dissects along the SUBSCAPULARIS musculotendinous junction, probably representing a ganglion arising from the superior SUBSCAPULARIS recess and dissecting along the tendon. Biceps long head tendon sheath  is also distended. There is no rotator cuff muscular atrophy. No aggressive osseous lesions. IMPRESSION: Severe RIGHT glenohumeral osteoarthritis.  No rotator cuff atrophy. Electronically Signed   By: Dereck Ligas M.D.   On: 09/23/2015 12:25   Dg Shoulder Right Port  10/13/2015  CLINICAL DATA:  Patient status post right shoulder replacement. EXAM: PORTABLE RIGHT SHOULDER - 2+ VIEW COMPARISON:  Chest radiograph 10/04/2015 FINDINGS: Postoperative changes compatible with right shoulder arthroplasty. Hardware appears in appropriate position without evidence for acute osseous abnormality. Visualized right hemi thorax is unremarkable. IMPRESSION: Patient status post right shoulder arthroplasty. Electronically Signed   By: Lovey Newcomer M.D.   On: 10/13/2015 10:41    Disposition: 01-Home or Self Care        Follow-up Information    Follow up with [name], MD. Schedule an appointment as soon as possible for a visit in 2 weeks.   Specialty:  Orthopedic Surgery   Contact information:   [address] [place] [address] [phone]        Signed: Grier Mitts 10/14/2015, 8:22 AM

## 2015-10-14 NOTE — Progress Notes (Signed)
   PATIENT ID: Jane Montgomery   1 Day Post-Op Procedure(s) (LRB): TOTAL SHOULDER ARTHROPLASTY (Right)  Subjective: Some R shoulder pain pain overnight when block wore off, comfortable this am.   Objective:  Filed Vitals:   10/14/15 0610  BP: 128/56  Pulse: 89  Temp: 98.6 F (37 C)  Resp: 18     Right UE dressing c/d/i Wiggles fingers, distally NVI  Labs:   Recent Labs  10/14/15 0414  HGB 10.4*   Recent Labs  10/14/15 0414  WBC 9.3  RBC 3.81*  HCT 34.5*  PLT 163  No results for input(s): NA, K, CL, CO2, BUN, CREATININE, GLUCOSE, CALCIUM in the last 72 hours.  Assessment and Plan: 1 day s/p right TSA Continue pain mgmt D/c today once cleared by OT, limited PROM to 140 FF and 40 ER Percocet in chart for home pain control Follow up with Dr. Tamera Punt in 2 weeks   VTE proph: ASA 325mg  BID, SCDs

## 2015-10-14 NOTE — Progress Notes (Signed)
Occupational Therapy Treatment Patient Details Name: Jane Montgomery MRN: [id number] DOB: May 22, 1950 Today's Date: 10/14/2015    History of present illness TOTAL SHOULDER ARTHROPLASTY (Right)   OT comments  Pt making good progress toward OT goals. Pt demonstrated ability to complete LB/UB dressing with min A overall. Pts husband independent in assisting with PROM shoulder exercises, pt independent in completing AROM elbow/wrist/hand exercises. All education complete at this time; pt able to teach back. Pt ready to d/c home from an OT standpoint. Continue to follow pt acutely.    Follow Up Recommendations  Supervision/Assistance - 24 hour;Other (comment) (follow up per MD)    Equipment Recommendations  None recommended by OT    Recommendations for Other Services      Precautions / Restrictions Precautions Precautions: Shoulder;Fall Type of Shoulder Precautions: Passive protocol: PROM gentle ER to 40, FF 140, no ABD. AROM elbow, wrist, hand OK. Shoulder Interventions: Shoulder sling/immobilizer;At all times;Off for dressing/bathing/exercises Precaution Booklet Issued: Yes (comment) Precaution Comments: Reviewed precautions Required Braces or Orthoses: Sling Restrictions Weight Bearing Restrictions: Yes RUE Weight Bearing: Non weight bearing       Mobility Bed Mobility Overal bed mobility: Needs Assistance Bed Mobility: Supine to Sit     Supine to sit: Supervision;HOB elevated     General bed mobility comments: Pt in recliner, returned to recliner at end of session.  Transfers Overall transfer level: Needs assistance Equipment used: None Transfers: Sit to/from Stand Sit to Stand: Min guard         General transfer comment: sit <> stand from chair x 1    Balance Overall balance assessment: Needs assistance Sitting-balance support: Feet supported;No upper extremity supported Sitting balance-Leahy Scale: Good     Standing balance support: No upper extremity  supported Standing balance-Leahy Scale: Fair                     ADL Overall ADL's : Needs assistance/impaired Eating/Feeding: Set up;Sitting               Upper Body Dressing : Minimal assistance;Sitting   Lower Body Dressing: Moderate assistance;Sit to/from stand   Toilet Transfer: Min guard;Ambulation   Toileting- Clothing Manipulation and Hygiene: Minimal assistance;Sit to/from stand       Functional mobility during ADLs: Min guard General ADL Comments: Pts husband present for OT session. Reviewed compensatory strategies for ADLs, UB bathing/dressing technique, edema management techniques, R UE positioning, sling management and wear schedule; pt able to teach back and return demonstrate. Educated on need for close supervision during ADLs and functional mobility for safety; pt and husband verbalized understanding.       Vision                     Perception     Praxis      Cognition   Behavior During Therapy: WFL for tasks assessed/performed Overall Cognitive Status: Within Functional Limits for tasks assessed                       Extremity/Trunk Assessment  Upper Extremity Assessment Upper Extremity Assessment: RUE deficits/detail RUE Deficits / Details: Wrist, hand ROM/MMT WFL RUE: Unable to fully assess due to pain;Unable to fully assess due to immobilization   Lower Extremity Assessment Lower Extremity Assessment: Overall WFL for tasks assessed        Exercises Shoulder Exercises Shoulder Flexion: PROM;Right;10 reps;Supine (0-80 degrees, husband demonstrated ability to assist) Shoulder External Rotation: PROM;Right;10 reps;Supine (not  able to reach neutral, husband demonstrated assist ) Elbow Flexion: AROM;Right;10 reps;Supine Wrist Flexion: AROM;Right;10 reps;Supine Digit Composite Flexion: AROM;Right;10 reps;Supine Donning/doffing shirt without moving shoulder: Minimal assistance;Caregiver independent with task;Patient able  to independently direct caregiver Method for sponge bathing under operated UE: Minimal assistance;Caregiver independent with task;Patient able to independently direct caregiver Donning/doffing sling/immobilizer: Minimal assistance;Caregiver independent with task;Patient able to independently direct caregiver Correct positioning of sling/immobilizer: Minimal assistance;Caregiver independent with task;Patient able to independently direct caregiver ROM for elbow, wrist and digits of operated UE: Supervision/safety;Patient able to independently direct caregiver Sling wearing schedule (on at all times/off for ADL's): Supervision/safety;Patient able to independently direct caregiver Proper positioning of operated UE when showering: Minimal assistance;Caregiver independent with task;Patient able to independently direct caregiver Positioning of UE while sleeping: Minimal assistance;Caregiver independent with task;Patient able to independently direct caregiver   Shoulder Instructions Shoulder Instructions Donning/doffing shirt without moving shoulder: Minimal assistance;Caregiver independent with task;Patient able to independently direct caregiver Method for sponge bathing under operated UE: Minimal assistance;Caregiver independent with task;Patient able to independently direct caregiver Donning/doffing sling/immobilizer: Minimal assistance;Caregiver independent with task;Patient able to independently direct caregiver Correct positioning of sling/immobilizer: Minimal assistance;Caregiver independent with task;Patient able to independently direct caregiver ROM for elbow, wrist and digits of operated UE: Supervision/safety;Patient able to independently direct caregiver Sling wearing schedule (on at all times/off for ADL's): Supervision/safety;Patient able to independently direct caregiver Proper positioning of operated UE when showering: Minimal assistance;Caregiver independent with task;Patient able to  independently direct caregiver Positioning of UE while sleeping: Minimal assistance;Caregiver independent with task;Patient able to independently direct caregiver     General Comments      Pertinent Vitals/ Pain       Pain Assessment: Faces Pain Score: 6  Faces Pain Scale: Hurts even more Pain Location: R shoulder Pain Descriptors / Indicators: Grimacing;Sore Pain Intervention(s): Limited activity within patient's tolerance;Monitored during session;Repositioned;Ice applied  Home Living Family/patient expects to be discharged to:: Private residence Living Arrangements: Spouse/significant other Available Help at Discharge: Family;Available 24 hours/day Type of Home: House Home Access: Stairs to enter [employer] of Steps: 3   Home Layout: One level     Bathroom Shower/Tub: Tub/shower unit;Walk-in shower   Bathroom Toilet: Standard     Home Equipment: Cane - single point;Shower seat          Prior Functioning/Environment Level of Independence: Independent            Frequency Min 3X/week     Progress Toward Goals  OT Goals(current goals can now be found in the care plan section)  Progress towards OT goals: Progressing toward goals  Acute Rehab OT Goals Patient Stated Goal: to go home later today OT Goal Formulation: With patient Time For Goal Achievement: 10/28/15 Potential to Achieve Goals: Good ADL Goals Pt Will Perform Upper Body Bathing: with modified independence;sitting (adhering to shoulder precautions) Pt Will Perform Upper Body Dressing: with modified independence;sitting (adhering to shoulder precautions) Pt Will Perform Lower Body Dressing: with supervision;sit to/from stand (adhering to shoulder precautions) Pt/caregiver will Perform Home Exercise Program: Increased ROM;Right Upper extremity;Independently;With written HEP provided  Plan Discharge plan remains appropriate    Co-evaluation                 End of Session  Equipment Utilized During Treatment: Other (comment) (sling)   Activity Tolerance Patient tolerated treatment well   Patient Left in chair;with call bell/phone within reach;with family/visitor present   Nurse Communication Other (comment) (Pt ready to d/c home from OT standpoint)  Time: 9811-9147 OT Time Calculation (min): 31 min  Charges: OT General Charges $OT Visit: 1 Procedure OT Evaluation $Initial OT Evaluation Tier I: 1 Procedure OT Treatments $Self Care/Home Management : 8-22 mins $Therapeutic Exercise: 8-22 mins  Binnie Kand M.S., OTR/L Pager: [phone]  10/14/2015, 12:29 PM

## 2015-10-14 NOTE — Care Management Note (Signed)
Case Management Note  Patient Details  Name: KEITH CANCIO MRN: [id number] Date of Birth: 04-11-1950  Subjective/Objective:       S/p right total shoulder arthroplasty             Action/Plan: Per OT evaluation, home therapy or equipment not needed. Patient's husband available to assist after discharge.   Expected Discharge Date:                  Expected Discharge Plan:  Home/Self Care  In-House Referral:  NA  Discharge planning Services  CM Consult  Post Acute Care Choice:  NA Choice offered to:     DME Arranged:    DME Agency:     HH Arranged:    HH Agency:     Status of Service:  Completed, signed off  Medicare Important Message Given:    Date Medicare IM Given:    Medicare IM give by:    Date Additional Medicare IM Given:    Additional Medicare Important Message give by:     If discussed at [place] of Stay Meetings, dates discussed:    Additional Comments:  Nila Nephew, RN 10/14/2015, 10:33 AM

## 2015-10-23 IMAGING — CR DG CHEST 2V
2 series · 2 of 2 positions shown · non-contrast
Comparison: None in PACs

CLINICAL DATA: Preoperative examination prior to right total
shoulder arthroplasty, history of diabetes heart murmur, nonsmoker.

EXAM:
CHEST  2 VIEW

[w chest pa]
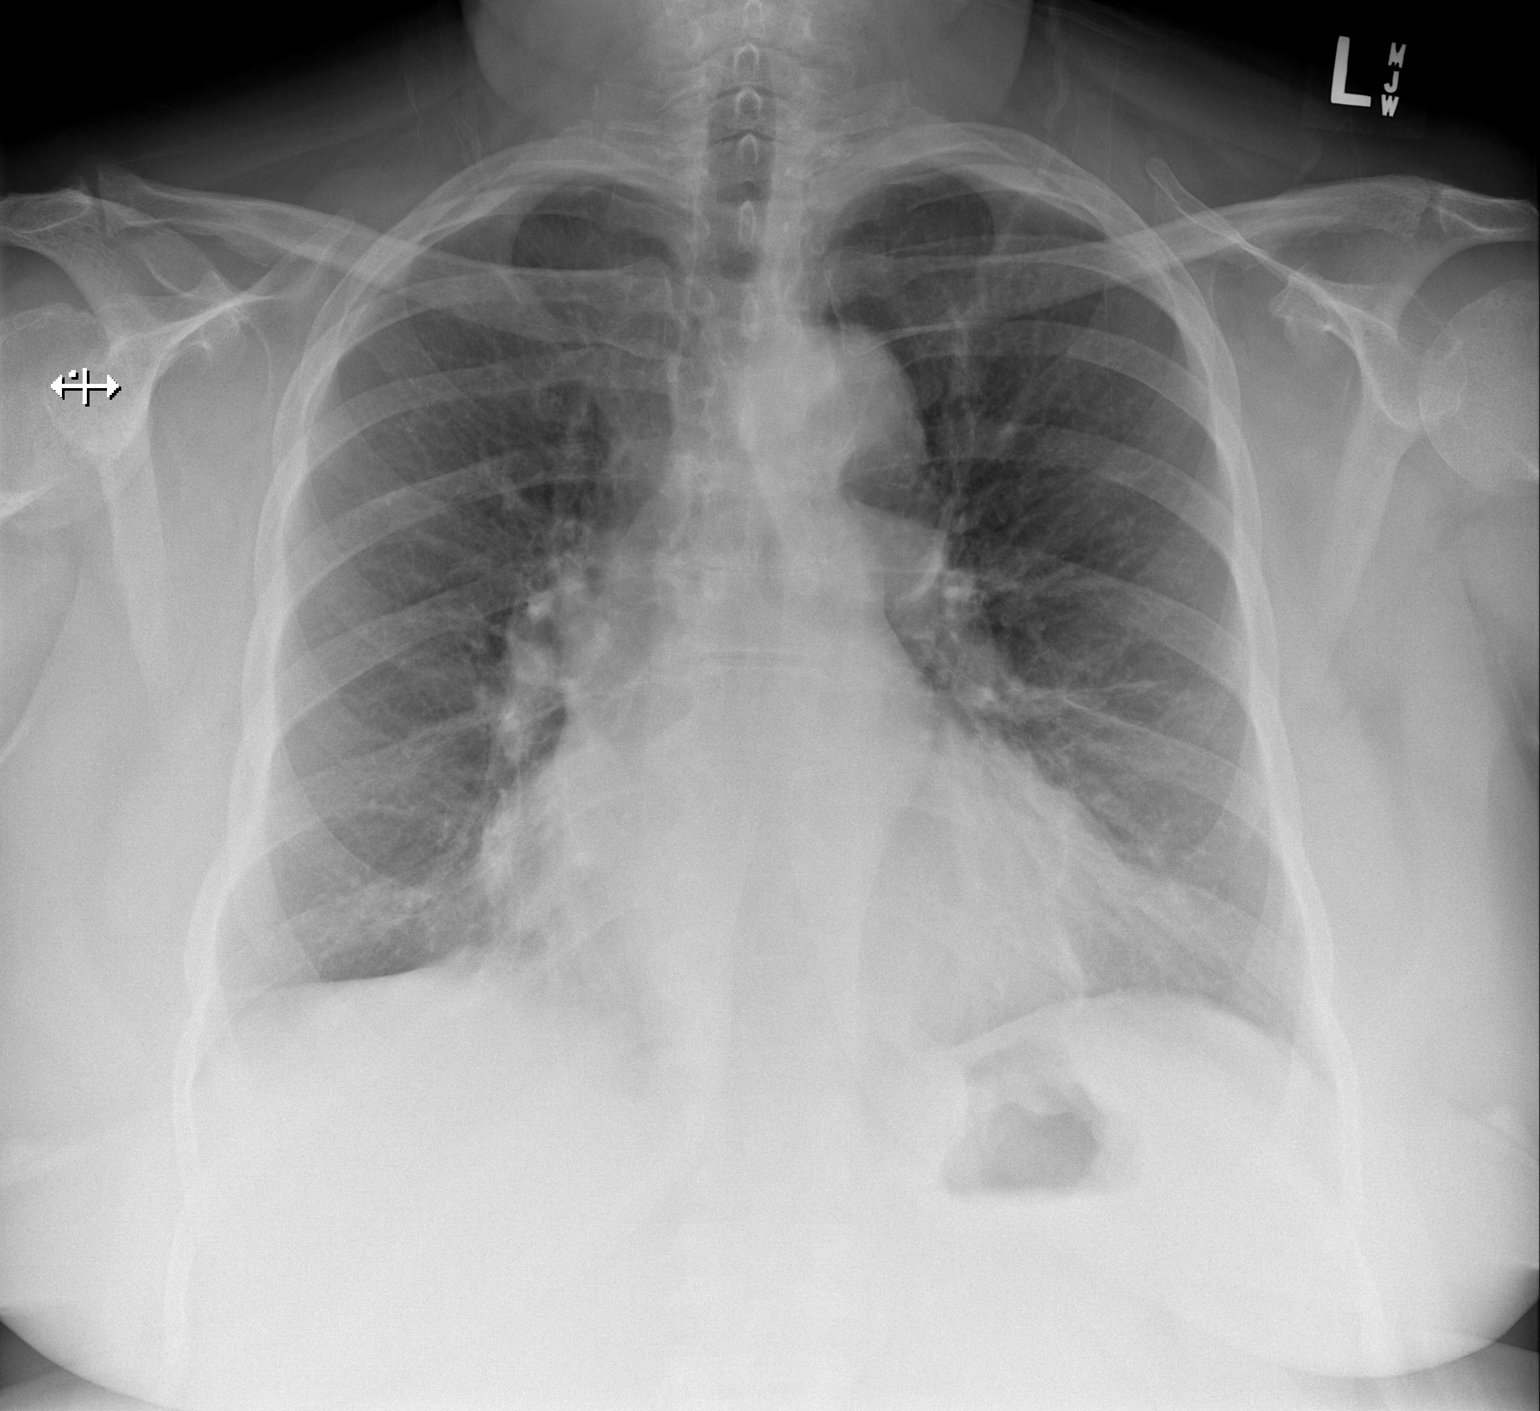

[w chest lat]
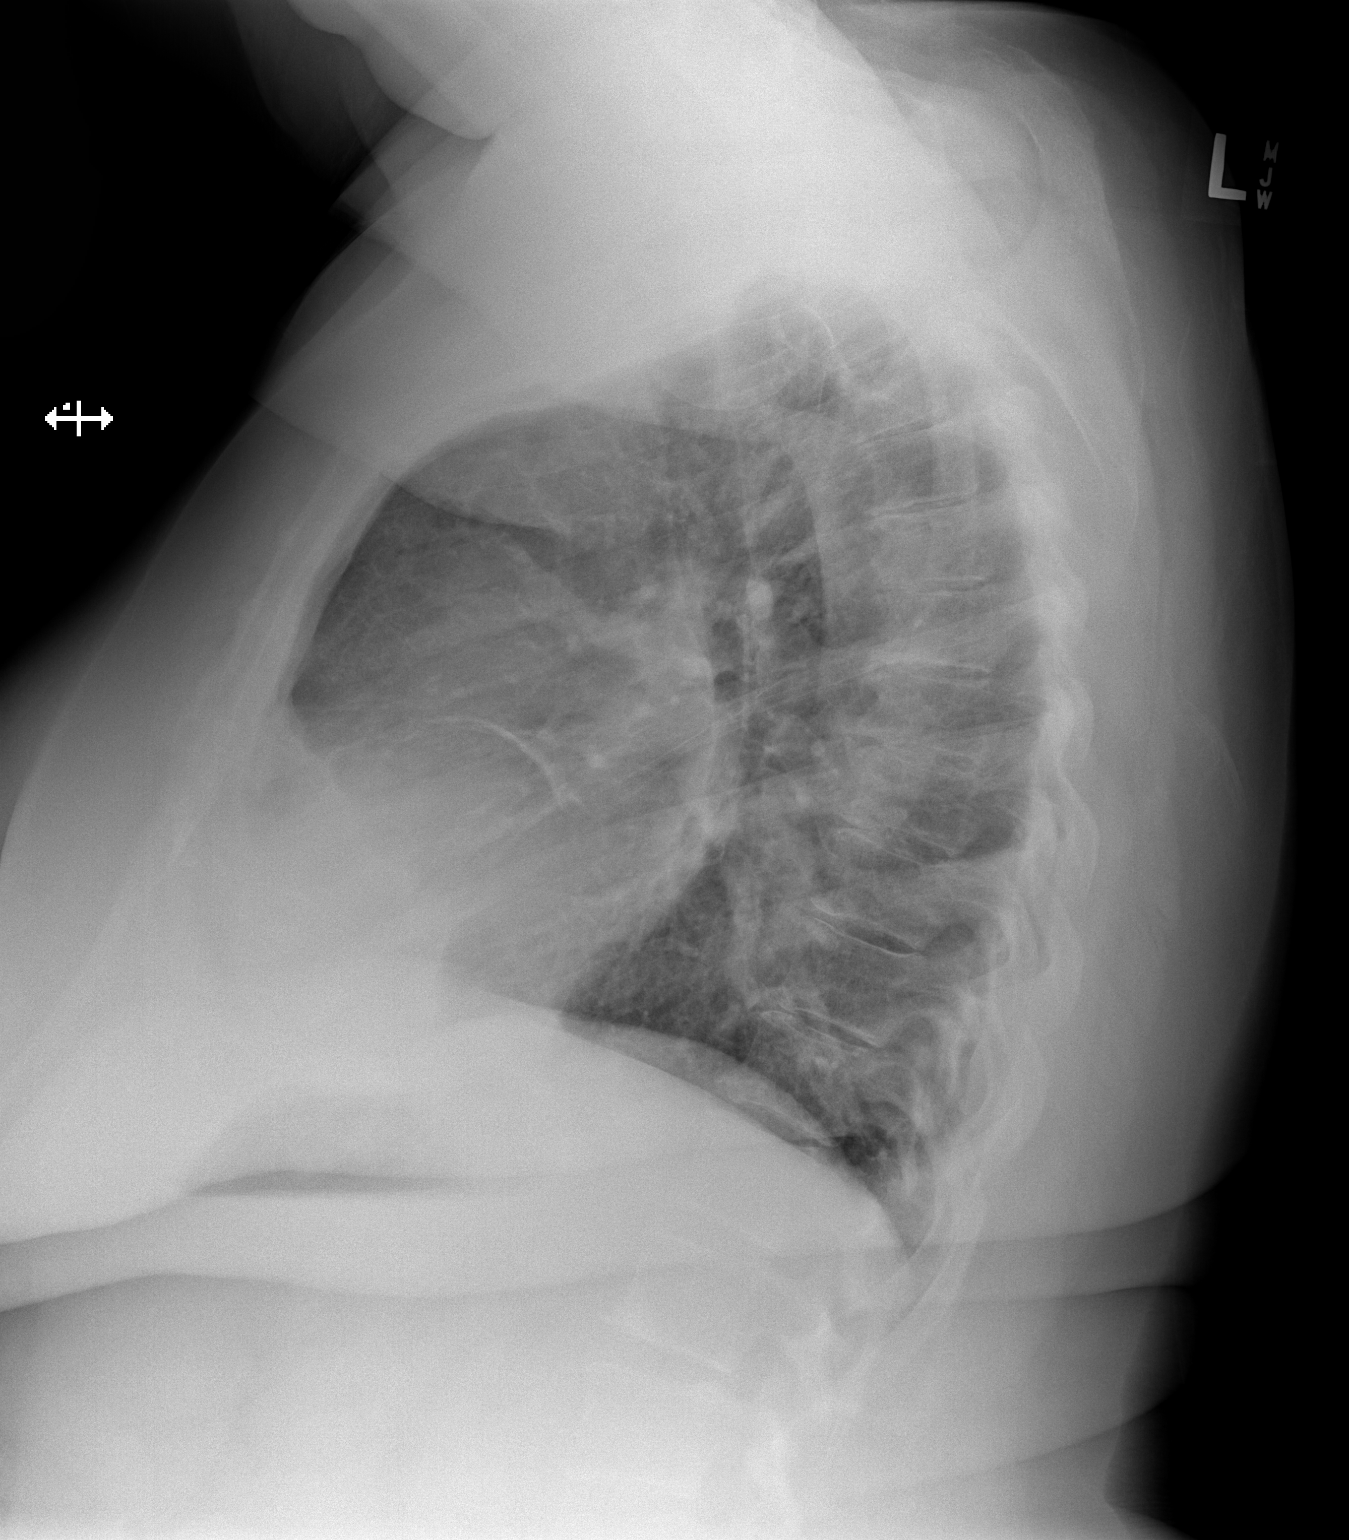

[2 of 2 positions shown; findings below may reference images not displayed]

FINDINGS: The lungs are adequately inflated. There is no focal infiltrate. The
interstitial markings are coarse especially in the left perihilar
region. The heart is top-normal in size. The pulmonary vascularity
is normal. There is tortuosity of the descending thoracic aorta.
There is mild multilevel degenerative disc disease of the thoracic
spine.
IMPRESSION: Mild cardiomegaly without CHF. Coarse interstitial lung markings
likely reflect chronic bronchitic changes.

## 2015-10-24 ENCOUNTER — Other Ambulatory Visit: Payer: Self-pay | Admitting: Internal Medicine

## 2015-10-25 ENCOUNTER — Other Ambulatory Visit: Payer: Self-pay | Admitting: Emergency Medicine

## 2015-10-25 MED ORDER — SERTRALINE HCL 100 MG PO TABS: ORAL_TABLET | ORAL | Status: DC

## 2015-10-25 NOTE — Telephone Encounter (Signed)
OK X 3 months

## 2015-10-25 NOTE — Telephone Encounter (Signed)
Has not been prescribed since 2014. Please advise

## 2015-12-22 ENCOUNTER — Telehealth: Payer: Self-pay

## 2015-12-22 NOTE — Telephone Encounter (Signed)
Left message advising patient to call back to schedule flu vaccine on nurse visit or call with flu vaccine info if recd somewhere else

## 2016-01-29 ENCOUNTER — Other Ambulatory Visit: Payer: Self-pay | Admitting: Internal Medicine

## 2016-01-30 NOTE — Telephone Encounter (Signed)
Last OV with Hopp 8/16. OV scheduled with Dr Quay Burow 03/07/15

## 2016-03-06 ENCOUNTER — Ambulatory Visit (INDEPENDENT_AMBULATORY_CARE_PROVIDER_SITE_OTHER): Payer: 59 | Admitting: Internal Medicine

## 2016-03-06 ENCOUNTER — Encounter: Payer: Self-pay | Admitting: Internal Medicine

## 2016-03-06 VITALS — BP 112/64 | HR 70 | Temp 98.6°F | Resp 16 | Wt 279.0 lb

## 2016-03-06 DIAGNOSIS — I10 Essential (primary) hypertension: Secondary | ICD-10-CM

## 2016-03-06 DIAGNOSIS — F329 Major depressive disorder, single episode, unspecified: Secondary | ICD-10-CM | POA: Diagnosis not present

## 2016-03-06 DIAGNOSIS — R7303 Prediabetes: Secondary | ICD-10-CM

## 2016-03-06 DIAGNOSIS — F32A Depression, unspecified: Secondary | ICD-10-CM

## 2016-03-06 DIAGNOSIS — E785 Hyperlipidemia, unspecified: Secondary | ICD-10-CM

## 2016-03-06 DIAGNOSIS — Z792 Long term (current) use of antibiotics: Secondary | ICD-10-CM | POA: Insufficient documentation

## 2016-03-06 MED ORDER — AMLODIPINE BESYLATE 5 MG PO TABS: 5.0000 mg | ORAL_TABLET | Freq: Every morning | ORAL | Status: DC

## 2016-03-06 MED ORDER — SERTRALINE HCL 100 MG PO TABS: 100.0000 mg | ORAL_TABLET | Freq: Every day | ORAL | Status: DC

## 2016-03-06 MED ORDER — PRAVASTATIN SODIUM 40 MG PO TABS: 40.0000 mg | ORAL_TABLET | Freq: Every day | ORAL | Status: DC

## 2016-03-06 MED ORDER — BENAZEPRIL HCL 20 MG PO TABS: 20.0000 mg | ORAL_TABLET | Freq: Every day | ORAL | Status: DC

## 2016-03-06 NOTE — Progress Notes (Signed)
Pre visit review using our clinic review tool, if applicable. No additional management support is needed unless otherwise documented below in the visit note. 

## 2016-03-06 NOTE — Assessment & Plan Note (Signed)
Last lipid panel controlled  continue statin at current dose Work on increasing activity and losing weight

## 2016-03-06 NOTE — Assessment & Plan Note (Signed)
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

## 2016-03-06 NOTE — Patient Instructions (Addendum)
Work on Occupational hygienist activity.    All other Health Maintenance issues reviewed.   All recommended immunizations and age-appropriate screenings are up-to-date.  No immunizations administered today.   Medications reviewed and updated.  No changes recommended at this time.  Your prescription(s) have been submitted to your pharmacy. Please take as directed and contact our office if you believe you are having problem(s) with the medication(s).   Please followup in 6 months for a physical

## 2016-03-06 NOTE — Progress Notes (Signed)
Subjective:    Patient ID: Jane Montgomery, female    DOB: [birth year], 66 y.o.   MRN: [id number]  HPI She is here to establish with a new pcp.  She is here for follow up.  Hypertension: She is taking her medication daily. She is compliant with a low sodium diet.  She denies chest pain, palpitations, edema, shortness of breath and regular headaches. She is not  exercising regularly.  She does not monitor her blood pressure at home.    Hyperlipidemia: She is taking her medication daily. She is compliant with a low fat/cholesterol diet. She is not exercising regularly. She denies myalgias.   Prediabetes:  She is compliant with a low sugar/carbohydrate diet.  She is not exercising regularly. She checks her sugars on occasion  - not sure of the numbers.   Depression, anxiety: She is taking her medication daily as prescribed. She denies any side effects from the medication. She feels her depression and anxiety are well controlled and she is happy with her current dose of medication.   S/p shoulder replacement in October.  She had amoxicillin prior to dental work after that and had a UTI.    She has knee pain and needs to have a knee replacement, but needs to lose to weight first.  She has back pain.    Medications and allergies reviewed with patient and updated if appropriate.  Patient Active Problem List   Diagnosis Date Noted  . Need for prophylactic antibiotic for dental work 03/06/2016  . 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 OSA (obstructive sleep apnea) 10/04/2015  . Pre-diabetes 08/22/2015  . Allergic rhinitis 08/20/2014  . 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 Benign paroxysmal positional vertigo 08/19/2013  . Murmur 07/25/2011  . Hyperlipidemia 09/09/2007  . DISORDER, DYSMETABOLIC SYNDROME X [id number]  . Depression 09/09/2007  . Essential hypertension 09/09/2007    Current Outpatient Prescriptions on File Prior to Visit  Medication Sig Dispense Refill  . amLODipine  (NORVASC) 5 MG tablet Take 1 tablet (5 mg total) by mouth every morning. 90 tablet 2  . aspirin 81 MG tablet Take 81 mg by mouth daily.      . benazepril (LOTENSIN) 20 MG tablet Take 1 tablet (20 mg total) by mouth daily. 90 tablet 2  . Biotin 7500 MCG TABS Take 1 tablet by mouth daily.    . fluticasone (FLONASE) 50 MCG/ACT nasal spray Place 1 spray into both nostrils daily.    . Multiple Vitamins-Minerals (ICAPS PO) Take 1 tablet by mouth 2 (two) times daily.     . pravastatin (PRAVACHOL) 40 MG tablet Take 1 tablet (40 mg total) by mouth at bedtime. 90 tablet 2  . RESTASIS 0.05 % ophthalmic emulsion Place 1 drop into both eyes 2 (two) times daily.  4  . sertraline (ZOLOFT) 100 MG tablet TAKE 1 TABLET BY MOUTH EVERY DAY 90 tablet 0   No current facility-administered medications on file prior to visit.    Past Medical History  Diagnosis Date  . Hyperlipidemia     takes Pravastatin daily  . 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 Tremor, essential     Dr Erling Cruz  . Gilbert syndrome   . Benign positional vertigo     in [facility]  . Depression     takes Zoloft daily  . Hypertension     takes Amlodipine daily as well as Benazepril  . Shortness of breath dyspnea  allergy related and occasionally will take Claritin.  . Arthritis   . Joint pain   . Chronic back pain     reason unknown   . Diarrhea   . Diverticulosis   . Nocturia   . Gestational diabetes 1982, 1984    "pre"  . Macular degeneration     early stages and dry    Past Surgical History  Procedure Laterality Date  . Plantar fascia surgery Bilateral   . Cesarean section      x 2  . Dilation and curettage of uterus      Dr Irven [place]  . Colonoscopy  [phone]    Negative, redundant colon; [facility]  . Tonsillectomy    . Cataract extraction Bilateral 2015  . Total shoulder arthroplasty Right 10/13/2015    Procedure: TOTAL SHOULDER ARTHROPLASTY;  Surgeon: [name], MD;  Location: [facility];  Service:  Orthopedics;  Laterality: Right;  Right total shoulder arthroplasty    Social History   Social History  . Marital Status: Married    Spouse Name: N/A  . Number of Children: N/A  . Years of Education: N/A   Social History Main Topics  . Smoking status: Never Smoker   . Smokeless tobacco: Never Used  . Alcohol Use: No  . Drug Use: No  . Sexual Activity: Yes    Birth Control/ Protection: Post-menopausal   Other Topics Concern  . None   Social History Narrative    Family History  Problem Relation Age of Onset  . Hypertension Father   . Heart attack Father 64    CABG   . Colon cancer Mother 87  . Macular degeneration Mother   . Hypertension Mother   . Seizures Mother     ? etiology (preceded CVA)  . Stroke Mother 19  . Osteoporosis Mother   . Myelodysplastic syndrome Mother   . Colon cancer Maternal Grandmother   . Colon cancer Maternal Aunt   . Colon cancer      2 Maternal Great Aunts  . Heart attack Paternal Grandfather     > 55  . Heart attack Paternal Aunt 54  . Leukemia Paternal Grandmother   . Diabetes Neg Hx     Review of Systems  Constitutional: Negative for fever and chills.  Respiratory: Negative for cough, shortness of breath and wheezing.   Cardiovascular: Negative for chest pain, palpitations and leg swelling.  Gastrointestinal: Negative for nausea and abdominal pain.       No GERD  Neurological: Positive for dizziness (occasional, transient). Negative for light-headedness and headaches.  Psychiatric/Behavioral: Positive for dysphoric mood (controlled). The patient is nervous/anxious (controlled).        Objective:   Filed Vitals:   03/06/16 0933  BP: 112/64  Pulse: 70  Temp: 98.6 F (37 C)  Resp: 16   Filed Weights   03/06/16 0933  Weight: 279 lb (126.554 kg)   Body mass index is 51.02 kg/(m^2).   Physical Exam Constitutional: Appears well-developed and well-nourished. No distress.  Neck: Neck supple. No tracheal deviation  present. No thyromegaly present.  No carotid bruit. No cervical adenopathy.   Cardiovascular: Normal rate, regular rhythm and normal heart sounds.   No murmur heard.  No edema Pulmonary/Chest: Effort normal and breath sounds normal. No respiratory distress. No wheezes.  Psych: normal mood and affect     Assessment & Plan:   See Problem List for Assessment and Plan of chronic medical problems.  Follow up in 6 months for  PE Follow up in 6 months in 6 months

## 2016-03-06 NOTE — Assessment & Plan Note (Signed)
Has reached 6.5 in past - high risk of diabetes Will monitor at least once a year - advised her it should ideally be twice a year Stressed increasing activity Work on weight loss

## 2016-03-06 NOTE — Assessment & Plan Note (Signed)
BP well controlled Continue current medication 

## 2016-04-28 ENCOUNTER — Other Ambulatory Visit: Payer: Self-pay | Admitting: Internal Medicine

## 2016-06-22 ENCOUNTER — Other Ambulatory Visit: Payer: Self-pay | Admitting: Internal Medicine

## 2016-08-18 LAB — HM MAMMOGRAPHY

## 2016-08-21 ENCOUNTER — Encounter: Payer: Self-pay | Admitting: Internal Medicine

## 2016-08-27 ENCOUNTER — Encounter: Payer: Self-pay | Admitting: Internal Medicine

## 2017-03-27 ENCOUNTER — Other Ambulatory Visit: Payer: Self-pay | Admitting: Internal Medicine

## 2017-04-02 ENCOUNTER — Encounter: Payer: Self-pay | Admitting: Internal Medicine

## 2017-04-02 NOTE — Progress Notes (Signed)
Subjective:    Patient ID: ADDI PAK, female    DOB: [birth year], 67 y.o.   MRN: [id number]  HPI Here for medicare wellness exam and annual physical exam.   I have personally reviewed and have noted 1.The patient's medical and social history 2.Their use of alcohol, tobacco or illicit drugs 3.Their current medications and supplements 4.The patient's functional ability including ADL's, fall risks, home safety risks and hearing or visual impairment. 5.Diet and physical activities 6.Evidence for depression or mood disorders 7.Care team reviewed - Eye doctor -Dr Herbert Deaner, Dentist 2/year  She has lower back pain if she walks > 10 minutes or stands too long. She has right knee OA and needs a right total knee replacement but needs to lose weight first.   She is not able to lose weight. She has struggled with that her whole life.   She had a cold last week and still has a sore throat, PND and hoarseness.    Are there smokers in your home (other than you)? No  Risk Factors Exercise: none Dietary issues discussed:  Does not like fruits and veges - only eats two vegetables and salads that are not that healthy, eats a lot of chicken and [place], does not eat a lot of red meat, tries to avoid sweets  Cardiac risk factors: advanced age, hypertension, hyperlipidemia, and obesity (BMI >= 30 kg/m2).  Depression Screen  Have you felt down, depressed or hopeless? No  Have you felt little interest or pleasure in doing things?  No  Activities of Daily Living In your present state of health, do you have any difficulty performing the following activities?:  Driving? No Managing money?  No Feeding yourself? No Getting from bed to chair? No Climbing a flight of stairs? No Preparing food and eating?: No Bathing or showering? No Getting dressed: No Getting to/using the toilet? No Moving around from place to place: No In the past  year have you fallen or had a near fall?: No   Are you sexually active?  No  Do you have more than one partner?  N/A  Hearing Difficulties: No Do you often ask people to speak up or repeat themselves? No Do you experience ringing or noises in your ears? No Do you have difficulty understanding soft or whispered voices? No Vision:              Any change in vision: no             Up to date with eye exam:  yes Memory:  Do you feel that you have a problem with memory? No  Do you often misplace items? No  Do you feel safe at home?  Yes  Cognitive Testing  Alert, Orientated? Yes  Normal Appearance? Yes  Recall of three objects?  Yes  Can perform simple calculations? Yes  Displays appropriate judgment? Yes  Can read the correct time from a watch face? Yes   Advanced Directives have been discussed with the patient? Yes      Medications and allergies reviewed with patient and updated if appropriate.  Patient Active Problem List   Diagnosis Date Noted  . Need for prophylactic antibiotic for dental work 03/06/2016  . 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 OSA (obstructive sleep apnea) 10/04/2015  . Pre-diabetes 08/22/2015  . Allergic rhinitis 08/20/2014  . 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 Benign paroxysmal positional vertigo 08/19/2013  . Murmur 07/25/2011  . Hyperlipidemia 09/09/2007  . DISORDER, DYSMETABOLIC  SYNDROME X 09/09/2007  . Depression 09/09/2007  . Essential hypertension 09/09/2007    Current Outpatient Prescriptions on File Prior to Visit  Medication Sig Dispense Refill  . amLODipine (NORVASC) 5 MG tablet TAKE 1 TABLET(5 MG) BY MOUTH EVERY MORNING 90 tablet 0  . aspirin 81 MG tablet Take 81 mg by mouth daily.      . benazepril (LOTENSIN) 20 MG tablet TAKE 1 TABLET(20 MG) BY MOUTH DAILY 90 tablet 0  . Biotin 7500 MCG TABS Take 1 tablet by mouth daily.    . fluticasone (FLONASE) 50 MCG/ACT nasal spray Place 1 spray into both nostrils daily.    . Multiple  Vitamins-Minerals (ICAPS PO) Take 1 tablet by mouth 2 (two) times daily.     . pravastatin (PRAVACHOL) 40 MG tablet TAKE 1 TABLET(40 MG) BY MOUTH AT BEDTIME 90 tablet 0  . RESTASIS 0.05 % ophthalmic emulsion Place 1 drop into both eyes 2 (two) times daily.  4  . sertraline (ZOLOFT) 100 MG tablet TAKE 1 TABLET(100 MG) BY MOUTH DAILY 90 tablet 0   No current facility-administered medications on file prior to visit.     Past Medical History:  Diagnosis Date  . 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 Arthritis   . Benign positional vertigo    in [facility]  . Chronic back pain    reason unknown   . Depression    takes Zoloft daily  . Diarrhea   . Diverticulosis   . Gestational diabetes 1982, 1984   "pre"  . Gilbert syndrome   . Hyperlipidemia    takes Pravastatin daily  . Hypertension    takes Amlodipine daily as well as Benazepril  . Joint pain   . Macular degeneration    early stages and dry  . Nocturia   . Shortness of breath dyspnea    allergy related and occasionally will take Claritin.  . Tremor, essential    Dr Erling Cruz    Past Surgical History:  Procedure Laterality Date  . CATARACT EXTRACTION Bilateral 2015  . CESAREAN SECTION     x 2  . COLONOSCOPY  [phone]   Negative, redundant colon; [facility]  . DILATION AND CURETTAGE OF UTERUS     Dr Irven [place]  . PLANTAR FASCIA SURGERY Bilateral   . TONSILLECTOMY    . TOTAL SHOULDER ARTHROPLASTY Right 10/13/2015   Procedure: TOTAL SHOULDER ARTHROPLASTY;  Surgeon: [name], MD;  Location: [facility];  Service: Orthopedics;  Laterality: Right;  Right total shoulder arthroplasty    Social History   Social History  . Marital status: Married    Spouse name: N/A  . Number of children: N/A  . Years of education: N/A   Social History Main Topics  . Smoking status: Never Smoker  . Smokeless tobacco: Never Used  . Alcohol use No  . Drug use: No  . Sexual activity: Yes    Birth control/ protection: Post-menopausal     Other Topics Concern  . None   Social History Narrative  . None    Family History  Problem Relation Age of Onset  . Hypertension Father   . Heart attack Father 57    CABG   . Colon cancer Mother 54  . Macular degeneration Mother   . Hypertension Mother   . Seizures Mother     ? etiology (preceded CVA)  . Stroke Mother 55  . Osteoporosis Mother   . Myelodysplastic syndrome Mother   . Colon cancer  Maternal Grandmother   . Colon cancer Maternal Aunt   . Colon cancer      2 Maternal Great Aunts  . Heart attack Paternal Grandfather     > 55  . Heart attack Paternal Aunt 50  . Leukemia Paternal Grandmother   . Diabetes Neg Hx     Review of Systems  Constitutional: Negative for chills and fever.  HENT: Positive for congestion (clear), postnasal drip and sore throat. Negative for ear pain, hearing loss, sinus pain and tinnitus.   Eyes: Negative for visual disturbance.  Respiratory: Positive for cough (occ brings up phlegm). Negative for shortness of breath and wheezing.   Cardiovascular: Positive for leg swelling (if on feet a lot). Negative for chest pain and palpitations.  Gastrointestinal: Positive for diarrhea (intermittent). Negative for abdominal pain, blood in stool, constipation and nausea.       No gerd  Genitourinary: Negative for dysuria and hematuria.  Musculoskeletal: Positive for arthralgias (right knee - OA > left knee ) and back pain.  Skin: Positive for rash (on chin -itchy, x few months - improved with cortisone). Negative for color change.  Neurological: Positive for dizziness (when she lays down flat). Negative for light-headedness and headaches.  Psychiatric/Behavioral: Negative for dysphoric mood. The patient is not nervous/anxious.        Objective:   Vitals:   04/03/17 0801  BP: (!) 156/96  Pulse: (!) 59  Resp: 18  Temp: 98.5 F (36.9 C)   Filed Weights   04/03/17 0801  Weight: 272 lb (123.4 kg)   Body mass index is 49.75 kg/m.  Wt  Readings from Last 3 Encounters:  04/03/17 272 lb (123.4 kg)  03/06/16 279 lb (126.6 kg)  10/13/15 278 lb 6.4 oz (126.3 kg)     Physical Exam Constitutional: She appears well-developed and well-nourished. No distress.  HENT:  Head: Normocephalic and atraumatic.  Right Ear: External ear normal. Normal ear canal and TM Left Ear: External ear normal.  Normal ear canal and TM Mouth/Throat: Oropharynx is clear and moist.  Eyes: Conjunctivae and EOM are normal.  Neck: Neck supple. No tracheal deviation present. No thyromegaly present.  No carotid bruit  Cardiovascular: Normal rate, regular rhythm and normal heart sounds.   No murmur heard.  No edema. Pulmonary/Chest: Effort normal and breath sounds normal. No respiratory distress. She has no wheezes. She has no rales.  Breast: deferred to Gyn Abdominal: Soft. She exhibits no distension. There is no tenderness.  Lymphadenopathy: She has no cervical adenopathy.  Skin: Skin is warm and dry. She is not diaphoretic.  Psychiatric: She has a normal mood and affect. Her behavior is normal.         Assessment & Plan:   Wellness Exam: Immunizations  Pneumovax today, discussed shingles Colonoscopy     Up to date  Mammogram   Up to date  Dexa ordered Gyn - due - will schedule Eye exam   Up to date  Hearing loss   none Memory concerns/difficulties  none Independent of ADLs   fully Stressed the importance of regular exercise and weight loss   Patient received copy of preventative screening tests/immunizations recommended for the next 5-10 years.   Physical exam: Screening blood work  ordered Immunizations   Pneumovax today, discussed shingles Colonoscopy     Up to date  Mammogram   Up to date  Dexa ordered Gyn - due - will schedule Eye exam   Up to date EKG  Last done 10/2015  Exercise  - none - due to knee and back pain Weight - stressed weight loss - discussed decreasing portions to help with weight loss Skin rash on chin -  trial of clobetasol, no other changes Substance abuse   none  See Problem List for Assessment and Plan of chronic medical problems.   FU in 6 months

## 2017-04-03 ENCOUNTER — Ambulatory Visit (INDEPENDENT_AMBULATORY_CARE_PROVIDER_SITE_OTHER): Payer: Medicare Other | Admitting: Internal Medicine

## 2017-04-03 ENCOUNTER — Other Ambulatory Visit (INDEPENDENT_AMBULATORY_CARE_PROVIDER_SITE_OTHER): Payer: Medicare Other

## 2017-04-03 ENCOUNTER — Encounter: Payer: Self-pay | Admitting: Internal Medicine

## 2017-04-03 VITALS — BP 156/96 | HR 59 | Temp 98.5°F | Resp 18 | Ht 62.0 in | Wt 272.0 lb

## 2017-04-03 DIAGNOSIS — Z13820 Encounter for screening for osteoporosis: Secondary | ICD-10-CM | POA: Diagnosis not present

## 2017-04-03 DIAGNOSIS — Z23 Encounter for immunization: Secondary | ICD-10-CM | POA: Diagnosis not present

## 2017-04-03 DIAGNOSIS — E7800 Pure hypercholesterolemia, unspecified: Secondary | ICD-10-CM

## 2017-04-03 DIAGNOSIS — E2839 Other primary ovarian failure: Secondary | ICD-10-CM

## 2017-04-03 DIAGNOSIS — I10 Essential (primary) hypertension: Secondary | ICD-10-CM

## 2017-04-03 DIAGNOSIS — R7303 Prediabetes: Secondary | ICD-10-CM

## 2017-04-03 DIAGNOSIS — R0683 Snoring: Secondary | ICD-10-CM | POA: Diagnosis not present

## 2017-04-03 DIAGNOSIS — Z0000 Encounter for general adult medical examination without abnormal findings: Secondary | ICD-10-CM | POA: Diagnosis not present

## 2017-04-03 DIAGNOSIS — R21 Rash and other nonspecific skin eruption: Secondary | ICD-10-CM | POA: Diagnosis not present

## 2017-04-03 DIAGNOSIS — G4733 Obstructive sleep apnea (adult) (pediatric): Secondary | ICD-10-CM | POA: Diagnosis not present

## 2017-04-03 DIAGNOSIS — F3289 Other specified depressive episodes: Secondary | ICD-10-CM

## 2017-04-03 LAB — LIPID PANEL
CHOLESTEROL: 192 mg/dL (ref 0–200)
HDL: 58.4 mg/dL (ref >39.00)
LDL CALC: 102 mg/dL — AB (ref 0–99)
NonHDL: 133.76
TRIGLYCERIDES: 159 mg/dL — AB (ref 0.0–149.0)
Total CHOL/HDL Ratio: 3
VLDL: 31.8 mg/dL (ref 0.0–40.0)

## 2017-04-03 LAB — CBC WITH DIFFERENTIAL/PLATELET
Basophils Absolute: 0 10*3/uL (ref 0.0–0.1)
Basophils Relative: 0.4 % (ref 0.0–3.0)
Eosinophils Absolute: 0.2 10*3/uL (ref 0.0–0.7)
Eosinophils Relative: 2.3 % (ref 0.0–5.0)
HCT: 41.9 % (ref 36.0–46.0)
Hemoglobin: 13.6 g/dL (ref 12.0–15.0)
LYMPHS ABS: 1.5 10*3/uL (ref 0.7–4.0)
Lymphocytes Relative: 20.9 % (ref 12.0–46.0)
MCHC: 32.6 g/dL (ref 30.0–36.0)
MCV: 84 fl (ref 78.0–100.0)
MONO ABS: 0.4 10*3/uL (ref 0.1–1.0)
MONOS PCT: 5 % (ref 3.0–12.0)
NEUTROS ABS: 5 10*3/uL (ref 1.4–7.7)
NEUTROS PCT: 71.4 % (ref 43.0–77.0)
PLATELETS: 224 10*3/uL (ref 150.0–400.0)
RBC: 4.99 Mil/uL (ref 3.87–5.11)
RDW: 16.1 % — ABNORMAL HIGH (ref 11.5–15.5)
WBC: 7 10*3/uL (ref 4.0–10.5)

## 2017-04-03 LAB — COMPREHENSIVE METABOLIC PANEL
ALT: 22 U/L (ref 0–35)
AST: 16 U/L (ref 0–37)
Albumin: 4.4 g/dL (ref 3.5–5.2)
Alkaline Phosphatase: 130 U/L — ABNORMAL HIGH (ref 39–117)
BUN: 16 mg/dL (ref 6–23)
CALCIUM: 9.6 mg/dL (ref 8.4–10.5)
CHLORIDE: 99 meq/L (ref 96–112)
CO2: 34 meq/L — AB (ref 19–32)
Creatinine, Ser: 0.52 mg/dL (ref 0.40–1.20)
GFR: 125.73 mL/min (ref >60.00)
Glucose, Bld: 125 mg/dL — ABNORMAL HIGH (ref 70–99)
Potassium: 4.6 mEq/L (ref 3.5–5.1)
Sodium: 141 mEq/L (ref 135–145)
Total Bilirubin: 0.8 mg/dL (ref 0.2–1.2)
Total Protein: 7.7 g/dL (ref 6.0–8.3)

## 2017-04-03 LAB — TSH: TSH: 1.52 u[IU]/mL (ref 0.35–4.50)

## 2017-04-03 LAB — HEMOGLOBIN A1C: Hgb A1c MFr Bld: 6.6 % — ABNORMAL HIGH (ref 4.6–6.5)

## 2017-04-03 MED ORDER — CLOBETASOL PROP EMOLLIENT BASE 0.05 % EX CREA: TOPICAL_CREAM | CUTANEOUS | 0 refills | Status: DC

## 2017-04-03 NOTE — Assessment & Plan Note (Signed)
On chin Responds to otc cortisone Trial of clobetasol - use no more than 2 weeks - see derm if no improvement

## 2017-04-03 NOTE — Addendum Note (Signed)
Addended by: Terence Lux B on: 04/03/2017 10:33 AM   Modules accepted: Orders

## 2017-04-03 NOTE — Progress Notes (Signed)
Pre visit review using our clinic review tool, if applicable. No additional management support is needed unless otherwise documented below in the visit note. 

## 2017-04-03 NOTE — Assessment & Plan Note (Signed)
BP elevated today - stressed monitoring it at home - concern if it is controlled or not - states it is elevated from being here Continue current medications for now

## 2017-04-03 NOTE — Assessment & Plan Note (Signed)
Never officially tested - likely has OSA Referral to pulm

## 2017-04-03 NOTE — Assessment & Plan Note (Signed)
Check lipid panel  Continue daily statin Regular exercise and healthy diet encouraged  

## 2017-04-03 NOTE — Assessment & Plan Note (Signed)
Check a1c Low sugar / carb diet Stressed regular exercise, weight loss  

## 2017-04-03 NOTE — Assessment & Plan Note (Signed)
Controlled, stable Continue current dose of medication  

## 2017-04-03 NOTE — Patient Instructions (Addendum)
Jane Montgomery , Thank you for taking time to come for your Medicare Wellness Visit. I appreciate your ongoing commitment to your health goals. Please review the following plan we discussed and let me know if I can assist you in the future.   These are the goals we discussed: Goals    Decrease portions, work on weight loss      This is a list of the screening recommended for you and due dates:  Health Maintenance  Topic Date Due  . DEXA scan (bone density measurement)  02/11/2017  . Pneumonia vaccines (2 of 2 - PPSV23) 02/11/2017  . Flu Shot  07/31/2017  . Mammogram  08/21/2018  . Colon Cancer Screening  07/10/2020  . Tetanus Vaccine  08/13/2022  .  Hepatitis C: One time screening is recommended by Center for Disease Control  (CDC) for  adults born from [birth year] through 1963.   Completed  . HIV Screening  Completed     Test(s) ordered today. Your results will be released to [place] (or called to you) after review, usually within 72hours after test completion. If any changes need to be made, you will be notified at that same time.  All other Health Maintenance issues reviewed.   All recommended immunizations and age-appropriate screenings are up-to-date or discussed.  Pneumovax immunization administered today.   Medications reviewed and updated.   No changes recommended at this time.  Your prescription(s) have been submitted to your pharmacy. Please take as directed and contact our office if you believe you are having problem(s) with the medication(s).  A referral was ordered for pulmonary for evaluation of possible sleep apnea  Please followup in 6 months  Health Maintenance, Female Adopting a healthy lifestyle and getting preventive care can go a long way to promote health and wellness. Talk with your health care provider about what schedule of regular examinations is right for you. This is a good chance for you to check in with your provider about disease prevention and staying  healthy. In between checkups, there are plenty of things you can do on your own. Experts have done a lot of research about which lifestyle changes and preventive measures are most likely to keep you healthy. Ask your health care provider for more information. Weight and diet Eat a healthy diet  Be sure to include plenty of vegetables, fruits, low-fat dairy products, and lean protein.  Do not eat a lot of foods high in solid fats, added sugars, or salt.  Get regular exercise. This is one of the most important things you can do for your health.  Most adults should exercise for at least 150 minutes each week. The exercise should increase your heart rate and make you sweat (moderate-intensity exercise).  Most adults should also do strengthening exercises at least twice a week. This is in addition to the moderate-intensity exercise. Maintain a healthy weight  Body mass index (BMI) is a measurement that can be used to identify possible weight problems. It estimates body fat based on height and weight. Your health care provider can help determine your BMI and help you achieve or maintain a healthy weight.  For females 12 years of age and older:  A BMI below 18.5 is considered underweight.  A BMI of 18.5 to 24.9 is normal.  A BMI of 25 to 29.9 is considered overweight.  A BMI of 30 and above is considered obese. Watch levels of cholesterol and blood lipids  You should start having your blood tested  for lipids and cholesterol at 65 years of age, then have this test every 5 years.  You may need to have your cholesterol levels checked more often if:  Your lipid or cholesterol levels are high.  You are older than 65 years of age.  You are at high risk for heart disease. Cancer screening Lung Cancer  Lung cancer screening is recommended for adults 79-83 years old who are at high risk for lung cancer because of a history of smoking.  A yearly low-dose CT scan of the lungs is recommended  for people who:  Currently smoke.  Have quit within the past 15 years.  Have at least a 30-pack-year history of smoking. A pack year is smoking an average of one pack of cigarettes a day for 1 year.  Yearly screening should continue until it has been 15 years since you quit.  Yearly screening should stop if you develop a health problem that would prevent you from having lung cancer treatment. Breast Cancer  Practice breast self-awareness. This means understanding how your breasts normally appear and feel.  It also means doing regular breast self-exams. Let your health care provider know about any changes, no matter how small.  If you are in your 20s or 30s, you should have a clinical breast exam (CBE) by a health care provider every 1-3 years as part of a regular health exam.  If you are 75 or older, have a CBE every year. Also consider having a breast X-ray (mammogram) every year.  If you have a family history of breast cancer, talk to your health care provider about genetic screening.  If you are at high risk for breast cancer, talk to your health care provider about having an MRI and a mammogram every year.  Breast cancer gene (BRCA) assessment is recommended for women who have family members with BRCA-related cancers. BRCA-related cancers include:  Breast.  Ovarian.  Tubal.  Peritoneal cancers.  Results of the assessment will determine the need for genetic counseling and BRCA1 and BRCA2 testing. Cervical Cancer  Your health care provider may recommend that you be screened regularly for cancer of the pelvic organs (ovaries, uterus, and vagina). This screening involves a pelvic examination, including checking for microscopic changes to the surface of your cervix (Pap test). You may be encouraged to have this screening done every 3 years, beginning at age 77.  For women ages 24-65, health care providers may recommend pelvic exams and Pap testing every 3 years, or they may  recommend the Pap and pelvic exam, combined with testing for human papilloma virus (HPV), every 5 years. Some types of HPV increase your risk of cervical cancer. Testing for HPV may also be done on women of any age with unclear Pap test results.  Other health care providers may not recommend any screening for nonpregnant women who are considered low risk for pelvic cancer and who do not have symptoms. Ask your health care provider if a screening pelvic exam is right for you.  If you have had past treatment for cervical cancer or a condition that could lead to cancer, you need Pap tests and screening for cancer for at least 20 years after your treatment. If Pap tests have been discontinued, your risk factors (such as having a new sexual partner) need to be reassessed to determine if screening should resume. Some women have medical problems that increase the chance of getting cervical cancer. In these cases, your health care provider may recommend more frequent  screening and Pap tests. Colorectal Cancer  This type of cancer can be detected and often prevented.  Routine colorectal cancer screening usually begins at 65 years of age and continues through 65 years of age.  Your health care provider may recommend screening at an earlier age if you have risk factors for colon cancer.  Your health care provider may also recommend using home test kits to check for hidden blood in the stool.  A small camera at the end of a tube can be used to examine your colon directly (sigmoidoscopy or colonoscopy). This is done to check for the earliest forms of colorectal cancer.  Routine screening usually begins at age 41.  Direct examination of the colon should be repeated every 5-10 years through 65 years of age. However, you may need to be screened more often if early forms of precancerous polyps or small growths are found. Skin Cancer  Check your skin from head to toe regularly.  Tell your health care provider  about any new moles or changes in moles, especially if there is a change in a mole's shape or color.  Also tell your health care provider if you have a mole that is larger than the size of a pencil eraser.  Always use sunscreen. Apply sunscreen liberally and repeatedly throughout the day.  Protect yourself by wearing long sleeves, pants, a wide-brimmed hat, and sunglasses whenever you are outside. Heart disease, diabetes, and high blood pressure  High blood pressure causes heart disease and increases the risk of stroke. High blood pressure is more likely to develop in:  People who have blood pressure in the high end of the normal range (130-139/85-89 mm Hg).  People who are overweight or obese.  People who are African American.  If you are 79-64 years of age, have your blood pressure checked every 3-5 years. If you are 59 years of age or older, have your blood pressure checked every year. You should have your blood pressure measured twice-once when you are at a hospital or clinic, and once when you are not at a hospital or clinic. Record the average of the two measurements. To check your blood pressure when you are not at a hospital or clinic, you can use:  An automated blood pressure machine at a pharmacy.  A home blood pressure monitor.  If you are between 59 years and 63 years old, ask your health care provider if you should take aspirin to prevent strokes.  Have regular diabetes screenings. This involves taking a blood sample to check your fasting blood sugar level.  If you are at a normal weight and have a low risk for diabetes, have this test once every three years after 65 years of age.  If you are overweight and have a high risk for diabetes, consider being tested at a younger age or more often. Preventing infection Hepatitis B  If you have a higher risk for hepatitis B, you should be screened for this virus. You are considered at high risk for hepatitis B if:  You were  born in a country where hepatitis B is common. Ask your health care provider which countries are considered high risk.  Your parents were born in a high-risk country, and you have not been immunized against hepatitis B (hepatitis B vaccine).  You have HIV or AIDS.  You use needles to inject street drugs.  You live with someone who has hepatitis B.  You have had sex with someone who has hepatitis  B.  You get hemodialysis treatment.  You take certain medicines for conditions, including cancer, organ transplantation, and autoimmune conditions. Hepatitis C  Blood testing is recommended for:  Everyone born from [birth year] through 1963.  Anyone with known risk factors for hepatitis C. Sexually transmitted infections (STIs)  You should be screened for sexually transmitted infections (STIs) including gonorrhea and chlamydia if:  You are sexually active and are younger than 65 years of age.  You are older than 65 years of age and your health care provider tells you that you are at risk for this type of infection.  Your sexual activity has changed since you were last screened and you are at an increased risk for chlamydia or gonorrhea. Ask your health care provider if you are at risk.  If you do not have HIV, but are at risk, it may be recommended that you take a prescription medicine daily to prevent HIV infection. This is called pre-exposure prophylaxis (PrEP). You are considered at risk if:  You are sexually active and do not regularly use condoms or know the HIV status of your partner(s).  You take drugs by injection.  You are sexually active with a partner who has HIV. Talk with your health care provider about whether you are at high risk of being infected with HIV. If you choose to begin PrEP, you should first be tested for HIV. You should then be tested every 3 months for as long as you are taking PrEP. Pregnancy  If you are premenopausal and you may become pregnant, ask your health  care provider about preconception counseling.  If you may become pregnant, take 400 to 800 micrograms (mcg) of folic acid every day.  If you want to prevent pregnancy, talk to your health care provider about birth control (contraception). Osteoporosis and menopause  Osteoporosis is a disease in which the bones lose minerals and strength with aging. This can result in serious bone fractures. Your risk for osteoporosis can be identified using a bone density scan.  If you are 85 years of age or older, or if you are at risk for osteoporosis and fractures, ask your health care provider if you should be screened.  Ask your health care provider whether you should take a calcium or vitamin D supplement to lower your risk for osteoporosis.  Menopause may have certain physical symptoms and risks.  Hormone replacement therapy may reduce some of these symptoms and risks. Talk to your health care provider about whether hormone replacement therapy is right for you. Follow these instructions at home:  Schedule regular health, dental, and eye exams.  Stay current with your immunizations.  Do not use any tobacco products including cigarettes, chewing tobacco, or electronic cigarettes.  If you are pregnant, do not drink alcohol.  If you are breastfeeding, limit how much and how often you drink alcohol.  Limit alcohol intake to no more than 1 drink per day for nonpregnant women. One drink equals 12 ounces of beer, 5 ounces of wine, or 1 ounces of hard liquor.  Do not use street drugs.  Do not share needles.  Ask your health care provider for help if you need support or information about quitting drugs.  Tell your health care provider if you often feel depressed.  Tell your health care provider if you have ever been abused or do not feel safe at home. This information is not intended to replace advice given to you by your health care provider. Make sure you discuss  any questions you have with  your health care provider. Document Released: 07/02/2011 Document Revised: 05/24/2016 Document Reviewed: 09/20/2015 Elsevier Interactive Patient Education  2017 [employer].

## 2017-04-04 ENCOUNTER — Encounter: Payer: Self-pay | Admitting: Internal Medicine

## 2017-04-04 DIAGNOSIS — E119 Type 2 diabetes mellitus without complications: Secondary | ICD-10-CM | POA: Insufficient documentation

## 2017-04-11 ENCOUNTER — Ambulatory Visit (INDEPENDENT_AMBULATORY_CARE_PROVIDER_SITE_OTHER)
Admission: RE | Admit: 2017-04-11 | Discharge: 2017-04-11 | Disposition: A | Discharge status: Home / Self Care | Payer: Medicare Other | Source: Ambulatory Visit | Attending: Internal Medicine | Admitting: Internal Medicine

## 2017-04-11 DIAGNOSIS — Z13820 Encounter for screening for osteoporosis: Secondary | ICD-10-CM

## 2017-04-11 DIAGNOSIS — E2839 Other primary ovarian failure: Secondary | ICD-10-CM | POA: Diagnosis not present

## 2017-04-15 ENCOUNTER — Encounter: Payer: Self-pay | Admitting: Internal Medicine

## 2017-04-15 DIAGNOSIS — M810 Age-related osteoporosis without current pathological fracture: Secondary | ICD-10-CM | POA: Insufficient documentation

## 2017-04-15 DIAGNOSIS — M8580 Other specified disorders of bone density and structure, unspecified site: Secondary | ICD-10-CM | POA: Insufficient documentation

## 2017-05-24 ENCOUNTER — Ambulatory Visit (INDEPENDENT_AMBULATORY_CARE_PROVIDER_SITE_OTHER): Payer: Medicare Other | Admitting: Pulmonary Disease

## 2017-05-24 ENCOUNTER — Encounter: Payer: Self-pay | Admitting: Pulmonary Disease

## 2017-05-24 VITALS — BP 130/80 | HR 76 | Ht 62.0 in | Wt 268.0 lb

## 2017-05-24 DIAGNOSIS — G4733 Obstructive sleep apnea (adult) (pediatric): Secondary | ICD-10-CM | POA: Diagnosis not present

## 2017-05-24 DIAGNOSIS — G4710 Hypersomnia, unspecified: Secondary | ICD-10-CM | POA: Diagnosis not present

## 2017-05-24 NOTE — Assessment & Plan Note (Signed)
Patient has snoring, frequent awakenings, witnessed apneas, gasping, choking, unrefreshed sleep. She has unrefreshed sleep. Has hypersomnia.  Naps daily. Hypersomnia affects functionality.   (-) abnormal behavior in sleep other than occasional sleep talke.   ESS 13.   Her husband has sleep apnea and uses CPAP machine and he loves it. They use APS.   Plan :   We discussed about the diagnosis of Obstructive Sleep Apnea (OSA) and implications of untreated OSA. We discussed about CPAP and BiPaP as possible treatment options.    We will schedule the patient for a sleep study. Plan for a home sleep study. They want to have the study done soon so we could not do a lab study since scheduling for a lab study is not until July. If the home study is negative, patient is okay with doing a lab study. Anticipate no issues with CPAP as the husband has sleep apnea. They want to use APS. They DO NOT  like advanced Homecare. Hopefully she'll be okay on auto CPAP 5-15 centimeters water. Patient is anticipated to have knee surgery this year.    Patient was instructed to call the office if he/she has not heard back from the office 1-2 weeks after the sleep study.   Patient was instructed to call the office if he/she is having issues with the PAP device.   We discussed good sleep hygiene.   Patient was advised not to engage in activities requiring concentration and/or vigilance if he/she is sleepy.  Patient was advised not to drive if he/she is sleepy.

## 2017-05-24 NOTE — Patient Instructions (Signed)
It was a pleasure taking care of you today!  We will schedule you to have a sleep study to determine if you have sleep apnea.   We will get a home sleep test.  You will be instructed to come back to the office to get an apparatus to sleep with overnight.  Once we have the apparatus, it will usually take [place] 1-2 weeks to read the study and get back at you with results of the test.  Please give [place] a call in 2 weeks after your study if you do not hear back from [place].    If the sleep study is positive, we will order you a CPAP  machine.  Please call the office if you do NOT receive your machine in the next 1-2 weeks.   Please make sure you use your CPAP device everytime you sleep.  We will monitor the usage of your machine per your insurance requirement.  Your insurance company may take the machine from you if you are not using it regularly.   Please clean the mask, tubings, filter, water reservoir with soapy water every week.  Please use distilled water for the water reservoir.   Please call the office or your machine provider (DME company) if you are having issues with the device.   Return to clinic in 8-10 weeks with NP

## 2017-05-24 NOTE — Progress Notes (Signed)
Subjective:    Patient ID: Jane Montgomery, female    DOB: 1950-03-20, 67 y.o.   MRN: [id number]  HPI   This is the case of Jane Montgomery, 67 y.o. Female, who was referred by Dr. Billey Gosling  in consultation regarding OSA.    As you very well know, patient is a non smoker, not been diagnosed with asthma or copd.   Patient has snoring, frequent awakenings, witnessed apneas, gasping, choking, unrefreshed sleep. She has unrefreshed sleep. Has hypersomnia.  Naps daily. Hypersomnia affects functionality.   (-) abnormal behavior in sleep other than occasional sleep talke.   ESS 13.   Her husband has sleep apnea and uses CPAP machine and he loves it. They use  [facility].   Review of Systems  Constitutional: Negative.  Negative for fever and unexpected weight change.  HENT: Positive for congestion and sinus pressure. Negative for dental problem, ear pain, nosebleeds, postnasal drip, rhinorrhea, sneezing, sore throat and trouble swallowing.   Eyes: Negative.  Negative for redness and itching.  Respiratory: Positive for shortness of breath. Negative for cough, chest tightness and wheezing.   Cardiovascular: Negative.  Negative for palpitations and leg swelling.  Gastrointestinal: Negative.  Negative for nausea and vomiting.  Endocrine: Negative.   Genitourinary: Negative.  Negative for dysuria.  Musculoskeletal: Positive for joint swelling.  Skin: Negative.  Negative for rash.  Allergic/Immunologic: Negative.  Negative for environmental allergies, food allergies and immunocompromised state.  Neurological: Negative.  Negative for headaches.  Hematological: Bruises/bleeds easily.  Psychiatric/Behavioral: Negative.  Negative for dysphoric mood. The patient is not nervous/anxious.    Past Medical History:  Diagnosis Date  . 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 Arthritis   . Benign positional vertigo    in LLDP  . Chronic back pain    reason unknown   . Depression    takes Zoloft  daily  . Diarrhea   . Diverticulosis   . Gestational diabetes 1982, 1984   "pre"  . Gilbert syndrome   . Hyperlipidemia    takes Pravastatin daily  . Hypertension    takes Amlodipine daily as well as Benazepril  . Joint pain   . Macular degeneration    early stages and dry  . Nocturia   . Shortness of breath dyspnea    allergy related and occasionally will take Claritin.  . Tremor, essential    Dr Erling Cruz    (-) CA, DVT.   Family History  Problem Relation Age of Onset  . Hypertension Father   . Heart attack Father 75       CABG   . Colon cancer Mother 49  . Macular degeneration Mother   . Hypertension Mother   . Seizures Mother        ? etiology (preceded CVA)  . Stroke Mother 68  . Osteoporosis Mother   . Myelodysplastic syndrome Mother   . Colon cancer Maternal Grandmother   . Colon cancer Maternal Aunt   . Colon cancer Unknown        2 Maternal Great Aunts  . Heart attack Paternal Grandfather        > 55  . Heart attack Paternal Aunt 75  . Leukemia Paternal Grandmother   . Diabetes Neg Hx      Past Surgical History:  Procedure Laterality Date  . CATARACT EXTRACTION Bilateral 2015  . CESAREAN SECTION     x 2  . COLONOSCOPY  [phone]   Negative,  redundant colon; [facility]  . DILATION AND CURETTAGE OF UTERUS     Dr Irven [place]  . PLANTAR FASCIA SURGERY Bilateral   . TONSILLECTOMY    . TOTAL SHOULDER ARTHROPLASTY Right 10/13/2015   Procedure: TOTAL SHOULDER ARTHROPLASTY;  Surgeon: [name], MD;  Location: [place];  Service: Orthopedics;  Laterality: Right;  Right total shoulder arthroplasty    Social History   Social History  . Marital status: Married    Spouse name: N/A  . Number of children: N/A  . Years of education: N/A   Occupational History  . Not on file.   Social History Main Topics  . Smoking status: Never Smoker  . Smokeless tobacco: Never Used  . Alcohol use No  . Drug use: No  . Sexual activity: Yes    Birth control/  protection: Post-menopausal   Other Topics Concern  . Not on file   Social History Narrative  . No narrative on file   Married with 2 children. Lives in [place].  She was a receptionist.   No Known Allergies   Outpatient Medications Prior to Visit  Medication Sig Dispense Refill  . amLODipine (NORVASC) 5 MG tablet TAKE 1 TABLET(5 MG) BY MOUTH EVERY MORNING 90 tablet 0  . aspirin 81 MG tablet Take 81 mg by mouth daily.      . benazepril (LOTENSIN) 20 MG tablet TAKE 1 TABLET(20 MG) BY MOUTH DAILY 90 tablet 0  . Biotin 7500 MCG TABS Take 1 tablet by mouth daily.    . fluticasone (FLONASE) 50 MCG/ACT nasal spray Place 1 spray into both nostrils daily.    . Multiple Vitamins-Minerals (ICAPS PO) Take 1 tablet by mouth 2 (two) times daily.     . pravastatin (PRAVACHOL) 40 MG tablet TAKE 1 TABLET(40 MG) BY MOUTH AT BEDTIME 90 tablet 0  . RESTASIS 0.05 % ophthalmic emulsion Place 1 drop into both eyes 2 (two) times daily.  4  . sertraline (ZOLOFT) 100 MG tablet TAKE 1 TABLET(100 MG) BY MOUTH DAILY 90 tablet 0  . XIIDRA 5 % SOLN INSTILL 1 GTT INTO OU BID  3  . Clobetasol Prop Emollient Base (CLOBETASOL PROPIONATE E) 0.05 % emollient cream Apply two times daily to rash on chin.  Do not use for more than 14 days (Patient not taking: Reported on 05/24/2017) 30 g 0   No facility-administered medications prior to visit.    No orders of the defined types were placed in this encounter.        Objective:   Physical Exam Vitals:  Vitals:   05/24/17 0956  BP: 130/80  Pulse: 76  SpO2: 96%  Weight: 268 lb (121.6 kg)  Height: 5\' 2"  (1.575 m)    Constitutional/General:  Pleasant, well-nourished, well-developed, not in any distress,  Comfortably seating.  Well kempt  Body mass index is 49.02 kg/m. Wt Readings from Last 3 Encounters:  05/24/17 268 lb (121.6 kg)  04/03/17 272 lb (123.4 kg)  03/06/16 279 lb (126.6 kg)     HEENT: Pupils equal and reactive to light and accommodation. Anicteric  sclerae. Normal nasal mucosa.   No oral  lesions,  mouth clear,  oropharynx clear, no postnasal drip. (-) Oral thrush. No dental caries.  Airway - Mallampati class III  Neck: No masses. Midline trachea. No JVD, (-) LAD. (-) bruits appreciated.  Respiratory/Chest: Grossly normal chest. (-) deformity. (-) Accessory muscle use.  Symmetric expansion. (-) Tenderness on palpation.  Resonant on percussion.  Diminished BS on both  lower lung zones. (-) wheezing, crackles, rhonchi (-) egophony  Cardiovascular: Regular rate and  rhythm, heart sounds normal, no murmur or gallops, no peripheral edema  Gastrointestinal:  Normal bowel sounds. Soft, non-tender. No hepatosplenomegaly.  (-) masses.   Musculoskeletal:  Normal muscle tone. Normal gait.   Extremities: Grossly normal. (-) clubbing, cyanosis.  (-) edema  Skin: (-) rash,lesions seen.   Neurological/Psychiatric : alert, oriented to time, place, person. Normal mood and affect           Assessment & Plan:  OSA (obstructive sleep apnea) Patient has snoring, frequent awakenings, witnessed apneas, gasping, choking, unrefreshed sleep. She has unrefreshed sleep. Has hypersomnia.  Naps daily. Hypersomnia affects functionality.   (-) abnormal behavior in sleep other than occasional sleep talke.   ESS 13.   Her husband has sleep apnea and uses CPAP machine and he loves it. They use [facility].   Plan :   We discussed about the diagnosis of Obstructive Sleep Apnea (OSA) and implications of untreated OSA. We discussed about CPAP and BiPaP as possible treatment options.    We will schedule the patient for a sleep study. Plan for a home sleep study. They want to have the study done soon so we could not do a lab study since scheduling for a lab study is not until July. If the home study is negative, patient is okay with doing a lab study. Anticipate no issues with CPAP as the husband has sleep apnea. They want to use [facility]. They DO NOT  like  [facility]. Hopefully she'll be okay on auto CPAP 5-15 centimeters water. Patient is anticipated to have knee surgery this year.    Patient was instructed to call the office if he/she has not heard back from the office 1-2 weeks after the sleep study.   Patient was instructed to call the office if he/she is having issues with the PAP device.   We discussed good sleep hygiene.   Patient was advised not to engage in activities requiring concentration and/or vigilance if he/she is sleepy.  Patient was advised not to drive if he/she is sleepy.       Thank you very much for letting me participate in this patient's care. Please do not hesitate to give me a call if you have any questions or concerns regarding the treatment plan.   Patient will follow up with [place] in 8-10 weeks.     Monica Becton, MD 05/24/2017   10:38 AM [facility] [place] Pager: [phone] Office: [phone], Fax: [phone]

## 2017-06-14 ENCOUNTER — Telehealth: Payer: Self-pay | Admitting: Pulmonary Disease

## 2017-06-14 NOTE — Telephone Encounter (Signed)
Called pt made her aware that Rodena Piety has here paper and will call her today.

## 2017-06-18 DIAGNOSIS — G4733 Obstructive sleep apnea (adult) (pediatric): Secondary | ICD-10-CM | POA: Diagnosis not present

## 2017-06-26 ENCOUNTER — Other Ambulatory Visit: Payer: Self-pay | Admitting: *Deleted

## 2017-06-26 ENCOUNTER — Telehealth: Payer: Self-pay | Admitting: Pulmonary Disease

## 2017-06-26 DIAGNOSIS — G4733 Obstructive sleep apnea (adult) (pediatric): Secondary | ICD-10-CM

## 2017-06-26 DIAGNOSIS — G4710 Hypersomnia, unspecified: Secondary | ICD-10-CM

## 2017-06-26 NOTE — Telephone Encounter (Signed)
Per RA, HST showed severe OSA with 53 events per hour. Recommends a CPAP titration study next.

## 2017-06-26 NOTE — Telephone Encounter (Signed)
Spoke with patient about results. She verbalized understanding. She had an appt with TP for next Friday at 3p for a 6-8wk follow up with her CPAP machine. That appt has been cancelled. Advised patient that after her CPAP titration, we will send in a RX for her CPAP machine. She'll need a follow up 6-8 weeks after receiving the machine. She verbalized understanding and wishes to proceed with titration. Order has been placed. Nothing further needed at time of call.

## 2017-06-27 ENCOUNTER — Other Ambulatory Visit: Payer: Self-pay | Admitting: Internal Medicine

## 2017-06-29 ENCOUNTER — Other Ambulatory Visit: Payer: Self-pay | Admitting: Internal Medicine

## 2017-07-05 ENCOUNTER — Ambulatory Visit: Payer: Medicare Other | Admitting: Adult Health

## 2017-08-24 LAB — HM MAMMOGRAPHY

## 2017-08-28 ENCOUNTER — Ambulatory Visit (HOSPITAL_BASED_OUTPATIENT_CLINIC_OR_DEPARTMENT_OTHER): Payer: Medicare Other | Attending: Pulmonary Disease | Admitting: Pulmonary Disease

## 2017-08-28 VITALS — Ht 62.0 in | Wt 268.0 lb

## 2017-08-28 DIAGNOSIS — G4733 Obstructive sleep apnea (adult) (pediatric): Secondary | ICD-10-CM | POA: Diagnosis present

## 2017-09-04 ENCOUNTER — Encounter: Payer: Self-pay | Admitting: Internal Medicine

## 2017-09-09 ENCOUNTER — Telehealth: Payer: Self-pay | Admitting: Pulmonary Disease

## 2017-09-09 DIAGNOSIS — G4733 Obstructive sleep apnea (adult) (pediatric): Secondary | ICD-10-CM

## 2017-09-09 NOTE — Procedures (Signed)
Patient Name: Jane Montgomery, Jane Montgomery Date: 08/28/2017 Gender: Female D.O.B: 1950-09-13 Age (years): 56 Referring Provider: Kara Mead MD, ABSM Height (inches): 62 Interpreting Physician: Kara Mead MD, ABSM Weight (lbs): 268 RPSGT: Jorge Ny BMI: 49 MRN: [id number] Neck Size: 16.50   CLINICAL INFORMATION Sleep Study Type: Split Night CPAP  Indication for sleep study: Diabetes, OSA, HST 05/2017 showed severe OSA with AHI 53/h  Epworth Sleepiness Score: 14  SLEEP STUDY TECHNIQUE As per the AASM Manual for the Scoring of Sleep and Associated Events v2.3 (April 2016) with a hypopnea requiring 4% desaturations.  The channels recorded and monitored were frontal, central and occipital EEG, electrooculogram (EOG), submentalis EMG (chin), nasal and oral airflow, thoracic and abdominal wall motion, anterior tibialis EMG, snore microphone, electrocardiogram, and pulse oximetry. Continuous positive airway pressure (CPAP) was initiated when the patient met split night criteria and was titrated according to treat sleep-disordered breathing.  RESPIRATORY PARAMETERS Total AHI (/hr): 4.8 RDI (/hr): 11.8 OA Index (/hr): 0.6 CA Index (/hr): 0.3 REM AHI (/hr): 10.3 NREM AHI (/hr): 2.6 Supine AHI (/hr): 10.0 Non-supine AHI (/hr): 4.66 Min O2 Sat (%): 80.00 Mean O2 (%): 88.04 Time below 88% (min): 119.8   Titration  Optimal Pressure (cm): 13 AHI at Optimal Pressure (/hr): 5 Min O2 at Optimal Pressure (%): 82 Supine % at Optimal (%): 0 Sleep % at Optimal (%): 90     SLEEP ARCHITECTURE The recording time for the entire night was 422.6 minutes.  During the titration  period of 422.6 minutes, the patient slept for 199.0 minutes in REM and nonREM, yielding a sleep efficiency of 47.1%. Sleep onset after lights out was 94.4 minutes with a REM latency of 211.0 minutes. The patient spent 7.04% of the night in stage N1 sleep, 63.82% in stage N2 sleep, 0.00% in stage N3 and 29.15% in REM.    CARDIAC  DATA The 2 lead EKG demonstrated sinus rhythm. The mean heart rate was 72.88 beats per minute. Other EKG findings include: None.   LEG MOVEMENT DATA The total Periodic Limb Movements of Sleep (PLMS) were 513. The PLMS index was 154.67 .  IMPRESSIONS -  An optimal PAP pressure of 13 cm was selected for this patient based on the available study data. - No significant central sleep apnea occurred during the study (CAI = 0.3/hour). - The patient snored with Soft snoring volume during the diagnostic portion of the study. - No cardiac abnormalities were noted during this study. - Severe periodic limb movements of sleep occurred during the study. Please corelate clinically   DIAGNOSIS - Obstructive Sleep Apnea (327.23 [G47.33 ICD-10]) - Periodic limb movements   RECOMMENDATIONS - CPAP 13 cm with medium full face mask. may need higher pressure since no supine sleep was noted at this final CPAP level - Avoid alcohol, sedatives and other CNS depressants that may worsen sleep apnea and disrupt normal sleep architecture. - Sleep hygiene should be reviewed to assess factors that may improve sleep quality. - Weight management and regular exercise should be initiated or continued. - [facility] to [facility] for [facility]-evaluation after 4 weeks of therapy   Kara Mead MD Board Certified in [place]

## 2017-09-09 NOTE — Telephone Encounter (Signed)
Pl send Rx for  Auto CPAP 10-15 cm with medium full face mask, DL in 4 wks OV in 6 wks

## 2017-09-10 NOTE — Telephone Encounter (Signed)
Spoke with patient. She is aware of the CPAP order. Will go ahead and place order. She stated that she would like for the order to go to [facility] since her husband uses them and likes them.

## 2017-09-11 DIAGNOSIS — G4730 Sleep apnea, unspecified: Secondary | ICD-10-CM | POA: Diagnosis not present

## 2017-10-02 NOTE — Progress Notes (Signed)
Subjective:    Patient ID: Jane Montgomery, female    DOB: Nov 25, 1950, 67 y.o.   MRN: [id number]  HPI The patient is here for follow up.  Hypertension: She is taking her medication daily. She is compliant with a low sodium diet, but admits she eats out a lot.  She denies chest pain, palpitations, edema, shortness of breath and regular headaches. She is not exercising regularly.  She does not monitor her blood pressure at home.    Diabetes: She is controlling her sugar with diet. She is more compliant with a diabetic diet. She is not exercising regularly.  She checks her feet daily and denies foot lesions. She is up-to-date with an ophthalmology examination.   Depression: She is taking her medication daily as prescribed. She denies any side effects from the medication. She feels her depression is well controlled and she is happy with her current dose of medication.   Hyperlipidemia: She is taking her medication daily. She is fairly compliant with a low fat/cholesterol diet. She is not exercising regularly.     Medications and allergies reviewed with patient and updated if appropriate.  Patient Active Problem List   Diagnosis Date Noted  . Osteopenia 04/15/2017  . Diabetes mellitus without complication ([place]) 52/84/1324  . Rash 04/03/2017  . Need for prophylactic antibiotic for dental work 03/06/2016  . 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 OSA (obstructive sleep apnea) 10/04/2015  . Allergic rhinitis 08/20/2014  . 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 Benign paroxysmal positional vertigo 08/19/2013  . Murmur 07/25/2011  . Hyperlipidemia 09/09/2007  . DISORDER, DYSMETABOLIC SYNDROME X 40/09/2724  . Depression 09/09/2007  . Essential hypertension 09/09/2007    Current Outpatient Prescriptions on File Prior to Visit  Medication Sig Dispense Refill  . amLODipine (NORVASC) 5 MG tablet TAKE 1 TABLET(5 MG) BY MOUTH EVERY MORNING 90 tablet 2  . aspirin 81 MG tablet Take 81 mg by  mouth daily.      . benazepril (LOTENSIN) 20 MG tablet TAKE 1 TABLET(20 MG) BY MOUTH DAILY 90 tablet 2  . Biotin 7500 MCG TABS Take 1 tablet by mouth daily.    . Multiple Vitamins-Minerals (ICAPS PO) Take 1 tablet by mouth 2 (two) times daily.     . pravastatin (PRAVACHOL) 40 MG tablet TAKE 1 TABLET(40 MG) BY MOUTH AT BEDTIME 90 tablet 2  . sertraline (ZOLOFT) 100 MG tablet TAKE 1 TABLET(100 MG) BY MOUTH DAILY 90 tablet 1   No current facility-administered medications on file prior to visit.     Past Medical History:  Diagnosis Date  . 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 Arthritis   . Benign positional vertigo    in LLDP  . Chronic back pain    reason unknown   . Depression    takes Zoloft daily  . Diarrhea   . Diverticulosis   . Gestational diabetes 1982, 1984   "pre"  . Gilbert syndrome   . Hyperlipidemia    takes Pravastatin daily  . Hypertension    takes Amlodipine daily as well as Benazepril  . Joint pain   . Macular degeneration    early stages and dry  . Nocturia   . Shortness of breath dyspnea    allergy related and occasionally will take Claritin.  . Tremor, essential    Dr Erling Cruz    Past Surgical History:  Procedure Laterality Date  . CATARACT EXTRACTION Bilateral 2015  . CESAREAN SECTION  x 2  . COLONOSCOPY  [id number]   Negative, redundant colon; [facility]  . DILATION AND CURETTAGE OF UTERUS     Dr Irven [place]  . PLANTAR FASCIA SURGERY Bilateral   . TONSILLECTOMY    . TOTAL SHOULDER ARTHROPLASTY Right 10/13/2015   Procedure: TOTAL SHOULDER ARTHROPLASTY;  Surgeon: [name], MD;  Location: [place];  Service: Orthopedics;  Laterality: Right;  Right total shoulder arthroplasty    Social History   Social History  . Marital status: Married    Spouse name: N/A  . Number of children: N/A  . Years of education: N/A   Social History Main Topics  . Smoking status: Never Smoker  . Smokeless tobacco: Never Used  . Alcohol use No  .  Drug use: No  . Sexual activity: Yes    Birth control/ protection: Post-menopausal   Other Topics Concern  . Not on file   Social History Narrative  . No narrative on file    Family History  Problem Relation Age of Onset  . Hypertension Father   . Heart attack Father 60       CABG   . Colon cancer Mother 60  . Macular degeneration Mother   . Hypertension Mother   . Seizures Mother        ? etiology (preceded CVA)  . Stroke Mother 59  . Osteoporosis Mother   . Myelodysplastic syndrome Mother   . Colon cancer Maternal Grandmother   . Colon cancer Maternal Aunt   . Colon cancer Unknown        2 Maternal Great Aunts  . Heart attack Paternal Grandfather        > 55  . Heart attack Paternal Aunt 42  . Leukemia Paternal Grandmother   . Diabetes Neg Hx     Review of Systems  Constitutional: Negative for chills and fever.  HENT: Positive for congestion.   Respiratory: Negative for cough, shortness of breath and wheezing.   Cardiovascular: Negative for chest pain, palpitations and leg swelling.  Neurological: Negative for light-headedness, numbness and headaches.       Objective:   Vitals:   10/03/17 0802 10/03/17 0825  BP: (!) 150/82 110/68  Pulse: 75   Resp: 18   Temp: 98.2 F (36.8 C)   SpO2: 92%    Wt Readings from Last 3 Encounters:  10/03/17 268 lb (121.6 kg)  08/28/17 268 lb (121.6 kg)  05/24/17 268 lb (121.6 kg)   Body mass index is 49.02 kg/m.   Physical Exam    Constitutional: Appears well-developed and well-nourished. No distress.  HENT:  Head: Normocephalic and atraumatic.  Neck: Neck supple. No tracheal deviation present. No thyromegaly present.  No cervical lymphadenopathy Cardiovascular: Normal rate, regular rhythm and normal heart sounds.   2/6 sys murmur heard. No carotid bruit .  No edema Pulmonary/Chest: Effort normal and breath sounds normal. No respiratory distress. No has no wheezes. No rales.  Skin: Skin is warm and dry. Not  diaphoretic.  Psychiatric: Normal mood and affect. Behavior is normal.   Diabetic Foot Exam - Simple   Simple Foot Form Diabetic Foot exam was performed with the following findings:  Yes 10/03/2017  8:18 AM  Visual Inspection No deformities, no ulcerations, no other skin breakdown bilaterally:  Yes Sensation Testing Intact to touch and monofilament testing bilaterally:  Yes Pulse Check Posterior Tibialis and Dorsalis pulse intact bilaterally:  Yes Comments       Assessment & Plan:    See  Problem List for Assessment and Plan of chronic medical problems.

## 2017-10-02 NOTE — Patient Instructions (Addendum)

## 2017-10-03 ENCOUNTER — Encounter: Payer: Self-pay | Admitting: Internal Medicine

## 2017-10-03 ENCOUNTER — Other Ambulatory Visit (INDEPENDENT_AMBULATORY_CARE_PROVIDER_SITE_OTHER): Payer: Medicare Other

## 2017-10-03 ENCOUNTER — Ambulatory Visit (INDEPENDENT_AMBULATORY_CARE_PROVIDER_SITE_OTHER): Payer: Medicare Other | Admitting: Internal Medicine

## 2017-10-03 VITALS — BP 110/68 | HR 75 | Temp 98.2°F | Resp 18 | Ht 62.0 in | Wt 268.0 lb

## 2017-10-03 DIAGNOSIS — E119 Type 2 diabetes mellitus without complications: Secondary | ICD-10-CM

## 2017-10-03 DIAGNOSIS — I10 Essential (primary) hypertension: Secondary | ICD-10-CM | POA: Diagnosis not present

## 2017-10-03 DIAGNOSIS — E7800 Pure hypercholesterolemia, unspecified: Secondary | ICD-10-CM

## 2017-10-03 DIAGNOSIS — F3289 Other specified depressive episodes: Secondary | ICD-10-CM

## 2017-10-03 LAB — COMPREHENSIVE METABOLIC PANEL
ALT: 20 U/L (ref 0–35)
AST: 16 U/L (ref 0–37)
Albumin: 4.3 g/dL (ref 3.5–5.2)
Alkaline Phosphatase: 117 U/L (ref 39–117)
BUN: 15 mg/dL (ref 6–23)
CALCIUM: 9.3 mg/dL (ref 8.4–10.5)
CHLORIDE: 98 meq/L (ref 96–112)
CO2: 31 meq/L (ref 19–32)
Creatinine, Ser: 0.48 mg/dL (ref 0.40–1.20)
GFR: 137.68 mL/min (ref >60.00)
GLUCOSE: 122 mg/dL — AB (ref 70–99)
POTASSIUM: 4.1 meq/L (ref 3.5–5.1)
Sodium: 139 mEq/L (ref 135–145)
Total Bilirubin: 0.9 mg/dL (ref 0.2–1.2)
Total Protein: 7.5 g/dL (ref 6.0–8.3)

## 2017-10-03 LAB — LIPID PANEL
CHOLESTEROL: 194 mg/dL (ref 0–200)
HDL: 57.3 mg/dL (ref >39.00)
NonHDL: 136.56
Total CHOL/HDL Ratio: 3
Triglycerides: 227 mg/dL — ABNORMAL HIGH (ref 0.0–149.0)
VLDL: 45.4 mg/dL — ABNORMAL HIGH (ref 0.0–40.0)

## 2017-10-03 LAB — LDL CHOLESTEROL, DIRECT: LDL DIRECT: 109 mg/dL

## 2017-10-03 LAB — HEMOGLOBIN A1C: HEMOGLOBIN A1C: 6.6 % — AB (ref 4.6–6.5)

## 2017-10-03 NOTE — Assessment & Plan Note (Signed)
Check lipid panel  Continue daily statin Regular exercise and healthy diet encouraged  

## 2017-10-03 NOTE — Assessment & Plan Note (Signed)
Controlled, stable Continue current dose of medication  

## 2017-10-03 NOTE — Assessment & Plan Note (Signed)
Check a1c Low sugar / carb diet Stressed regular exercise, weight loss Eye exam up to date

## 2017-10-03 NOTE — Assessment & Plan Note (Signed)
BP Readings from Last 3 Encounters:  10/03/17 (!) 150/82  05/24/17 130/80  04/03/17 (!) 156/96   She has white coat and we will recheck  Continue current medications cmp

## 2017-11-08 ENCOUNTER — Encounter: Payer: Self-pay | Admitting: Pulmonary Disease

## 2017-11-08 ENCOUNTER — Ambulatory Visit (INDEPENDENT_AMBULATORY_CARE_PROVIDER_SITE_OTHER): Payer: Medicare Other | Admitting: Pulmonary Disease

## 2017-11-08 DIAGNOSIS — I10 Essential (primary) hypertension: Secondary | ICD-10-CM

## 2017-11-08 DIAGNOSIS — G4733 Obstructive sleep apnea (adult) (pediatric): Secondary | ICD-10-CM

## 2017-11-08 NOTE — Patient Instructions (Signed)
We will check CPAP download and give you feedback. CPAP supplies will be renewed for a year

## 2017-11-08 NOTE — Assessment & Plan Note (Signed)
controlled 

## 2017-11-08 NOTE — Assessment & Plan Note (Addendum)
Continue auto CPAP settings 10-15 cm. She has settled in with a full facemask Download was reviewed which shows much improved compliance after using full facemask with avg setting of 10 cm and resolution of leak  Weight loss encouraged, compliance with goal of at least 4-6 hrs every night is the expectation. Advised against medications with sedative side effects Cautioned against driving when sleepy - understanding that sleepiness will vary on a day to day basis

## 2017-11-08 NOTE — Progress Notes (Signed)
   Subjective:    Patient ID: Jane Montgomery, female    DOB: Dec 11, 1950, 67 y.o.   MRN: [id number]  HPI  Chief Complaint  Patient presents with  . Follow-up    CPAP follow up. Uses [facility] as DME. Has been having issues with adjusting with mask. At the most, she was slept 2hrs.      She was diagnosed with a home sleep study in 05/2017 and started on autoCPAP after  titration study of 07/2017  For some reason she obtain a nasal mask, she was unable to tolerate this and her compliance was low, she felt like she was suffocating. About 2 nights ago she changed to full facemask and had to restful nights and felt really refreshed and with good energy.  She denies any problems with pressure. She does have a dry mouth and has to keep water by her bedside   Significant tests/ events reviewed  HST 05/2017 AHI 53/h   07/2017 CPAP 13 cm   Review of Systems neg for any significant sore throat, dysphagia, itching, sneezing, nasal congestion or excess/ purulent secretions, fever, chills, sweats, unintended wt loss, pleuritic or exertional cp, hempoptysis, orthopnea pnd or change in chronic leg swelling. Also denies presyncope, palpitations, heartburn, abdominal pain, nausea, vomiting, diarrhea or change in bowel or urinary habits, dysuria,hematuria, rash, arthralgias, visual complaints, headache, numbness weakness or ataxia.     Objective:   Physical Exam  Gen. Pleasant, obese, in no distress ENT - no lesions, no post nasal drip Neck: No JVD, no thyromegaly, no carotid bruits Lungs: no use of accessory muscles, no dullness to percussion, decreased without rales or rhonchi  Cardiovascular: Rhythm regular, heart sounds  normal, no murmurs or gallops, no peripheral edema Musculoskeletal: No deformities, no cyanosis or clubbing , no tremors       Assessment & Plan:

## 2017-12-18 ENCOUNTER — Other Ambulatory Visit: Payer: Self-pay | Admitting: Internal Medicine

## 2018-03-26 ENCOUNTER — Other Ambulatory Visit: Payer: Self-pay | Admitting: Internal Medicine

## 2018-04-03 ENCOUNTER — Encounter: Payer: Self-pay | Admitting: Internal Medicine

## 2018-04-03 NOTE — Progress Notes (Signed)
Subjective:    Patient ID: Jane Montgomery, female    DOB: [birth year], 68 y.o.   MRN: [id number]  HPI She is here for a physical exam.   Left lateral ankle pain:  She has pain with sitting or walking.  It is a shooting pain at times.  It started around the first of the year.  It is a little better today.      She has nasal congestion.  It is hard to use her use her cpap.  She uses a saline wash but that causes sneezing, swelling in the nasal passageways.  She sleeps elevated because she has difficulty breathing when she lays flat.    Medications and allergies reviewed with patient and updated if appropriate.  Patient Active Problem List   Diagnosis Date Noted  . Osteopenia 04/15/2017  . Diabetes mellitus without complication ([facility]) 67/59/1638  . Rash 04/03/2017  . Need for prophylactic antibiotic for dental work 03/06/2016  . 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 OSA (obstructive sleep apnea) 10/04/2015  . Allergic rhinitis 08/20/2014  . 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 Benign paroxysmal positional vertigo 08/19/2013  . Murmur 07/25/2011  . Hyperlipidemia 09/09/2007  . DISORDER, DYSMETABOLIC SYNDROME X 46/65/9935  . Depression 09/09/2007  . Essential hypertension 09/09/2007    Current Outpatient Medications on File Prior to Visit  Medication Sig Dispense Refill  . amLODipine (NORVASC) 5 MG tablet TAKE 1 TABLET(5 MG) BY MOUTH EVERY MORNING 90 tablet 0  . aspirin 81 MG tablet Take 81 mg by mouth daily.      . benazepril (LOTENSIN) 20 MG tablet TAKE 1 TABLET(20 MG) BY MOUTH DAILY 90 tablet 0  . Biotin 7500 MCG TABS Take 1 tablet by mouth daily.    . Multiple Vitamins-Minerals (ICAPS PO) Take 1 tablet by mouth 2 (two) times daily.     . pravastatin (PRAVACHOL) 40 MG tablet TAKE 1 TABLET(40 MG) BY MOUTH AT BEDTIME 90 tablet 0  . sertraline (ZOLOFT) 100 MG tablet TAKE 1 TABLET(100 MG) BY MOUTH DAILY 90 tablet 1   No current facility-administered medications on file prior to  visit.     Past Medical History:  Diagnosis Date  . 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 Arthritis   . Benign positional vertigo    in [facility]  . Chronic back pain    reason unknown   . Depression    takes Zoloft daily  . Diarrhea   . Diverticulosis   . Gestational diabetes 1982, 1984   "pre"  . Gilbert syndrome   . Hyperlipidemia    takes Pravastatin daily  . Hypertension    takes Amlodipine daily as well as Benazepril  . Joint pain   . Macular degeneration    early stages and dry  . Nocturia   . Shortness of breath dyspnea    allergy related and occasionally will take Claritin.  . Tremor, essential    Dr Erling Cruz    Past Surgical History:  Procedure Laterality Date  . CATARACT EXTRACTION Bilateral 2015  . CESAREAN SECTION     x 2  . COLONOSCOPY  [phone]   Negative, redundant colon; [facility]  . DILATION AND CURETTAGE OF UTERUS     Dr Irven [place]  . PLANTAR FASCIA SURGERY Bilateral   . TONSILLECTOMY    . TOTAL SHOULDER ARTHROPLASTY Right 10/13/2015   Procedure: TOTAL SHOULDER ARTHROPLASTY;  Surgeon: [name], MD;  Location: [facility];  Service: Orthopedics;  Laterality: Right;  Right total shoulder arthroplasty    Social History   Socioeconomic History  . Marital status: Married    Spouse name: Not on file  . Number of children: Not on file  . Years of education: Not on file  . Highest education level: Not on file  Occupational History  . Not on file  Social Needs  . Financial resource strain: Not on file  . Food insecurity:    Worry: Not on file    Inability: Not on file  . Transportation needs:    Medical: Not on file    Non-medical: Not on file  Tobacco Use  . Smoking status: Never Smoker  . Smokeless tobacco: Never Used  Substance and Sexual Activity  . Alcohol use: No  . Drug use: No  . Sexual activity: Yes    Birth control/protection: Post-menopausal  Lifestyle  . Physical activity:    Days per week: Not on file    Minutes  per session: Not on file  . Stress: Not on file  Relationships  . Social connections:    Talks on phone: Not on file    Gets together: Not on file    Attends religious service: Not on file    Active member of club or organization: Not on file    Attends meetings of clubs or organizations: Not on file    Relationship status: Not on file  Other Topics Concern  . Not on file  Social History Narrative  . Not on file    Family History  Problem Relation Age of Onset  . Hypertension Father   . Heart attack Father 34       CABG   . Colon cancer Mother 18  . Macular degeneration Mother   . Hypertension Mother   . Seizures Mother        ? etiology (preceded CVA)  . Stroke Mother 7  . Osteoporosis Mother   . Myelodysplastic syndrome Mother   . Colon cancer Maternal Grandmother   . Colon cancer Maternal Aunt   . Colon cancer Unknown        2 Maternal Great Aunts  . Heart attack Paternal Grandfather        > 55  . Heart attack Paternal Aunt 35  . Leukemia Paternal Grandmother   . Diabetes Neg Hx     Review of Systems  Constitutional: Negative for chills and fever.  HENT: Positive for postnasal drip (at night).   Eyes: Negative for visual disturbance.  Respiratory: Negative for cough, shortness of breath and wheezing.   Cardiovascular: Negative for chest pain, palpitations and leg swelling.  Gastrointestinal: Negative for blood in stool, constipation, diarrhea and nausea.       No gerd  Genitourinary: Negative for dysuria and hematuria.  Musculoskeletal: Positive for arthralgias and back pain.  Skin: Negative for color change and rash.  Neurological: Negative for light-headedness and headaches.  Psychiatric/Behavioral: Positive for dysphoric mood (controlled). The patient is not nervous/anxious.        Objective:   Vitals:   04/04/18 0805  BP: 140/80  Pulse: 79  Resp: 16  Temp: 97.8 F (36.6 C)  SpO2: 94%   Filed Weights   04/04/18 0805  Weight: 264 lb (119.7  kg)   Body mass index is 48.29 kg/m.  BP Readings from Last 3 Encounters:  04/04/18 140/80  11/08/17 122/76  10/03/17 110/68    Wt Readings from Last 3 Encounters:  04/04/18 264 lb (119.7  kg)  11/08/17 272 lb 9.6 oz (123.7 kg)  10/03/17 268 lb (121.6 kg)     Physical Exam Constitutional: She appears well-developed and well-nourished. No distress.  HENT:  Head: Normocephalic and atraumatic.  Right Ear: External ear normal. Normal ear canal and TM Left Ear: External ear normal.  Normal ear canal and TM Mouth/Throat: Oropharynx is clear and moist.  Eyes: Conjunctivae and EOM are normal.  Neck: Neck supple. No tracheal deviation present. No thyromegaly present.  No carotid bruit  Cardiovascular: Normal rate, regular rhythm and normal heart sounds.   2/6 systolic murmur heard.  No edema. Pulmonary/Chest: Effort normal and breath sounds normal. No respiratory distress. She has no wheezes. She has no rales.  Breast: deferred to Gyn Abdominal: Soft. She exhibits no distension. There is no tenderness.  Lymphadenopathy: She has no cervical adenopathy.  Skin: Skin is warm and dry. She is not diaphoretic.  Psychiatric: She has a normal mood and affect. Her behavior is normal.        Assessment & Plan:   Physical exam: Screening blood work ordered Immunizations   discussed shingles vaccine, others up-to-date Colonoscopy up-to-date Mammogram   up-to-date Gyn - no longer sees Dexa   up-to-date Eye exams   up-to-date-due October 2019 EKG last done 10/2015  Exercise  Trying to do elliptical Weight  Advised weight loss Skin   No concerns Substance abuse    none   See Problem List for Assessment and Plan of chronic medical problems.   Fu 6 months

## 2018-04-03 NOTE — Patient Instructions (Addendum)
Test(s) ordered today. Your results will be released to [facility] (or called to you) after review, usually within 72hours after test completion. If any changes need to be made, you will be notified at that same time.  All other Health Maintenance issues reviewed.   All recommended immunizations and age-appropriate screenings are up-to-date or discussed.  No immunizations administered today.   Medications reviewed and updated.  Changes include Dymista nasal spray.   Your prescription(s) have been submitted to your pharmacy. Please take as directed and contact our office if you believe you are having problem(s) with the medication(s).   Please followup in 6 months   Health Maintenance, Female Adopting a healthy lifestyle and getting preventive care can go a long way to promote health and wellness. Talk with your health care provider about what schedule of regular examinations is right for you. This is a good chance for you to check in with your provider about disease prevention and staying healthy. In between checkups, there are plenty of things you can do on your own. Experts have done a lot of research about which lifestyle changes and preventive measures are most likely to keep you healthy. Ask your health care provider for more information. Weight and diet Eat a healthy diet  Be sure to include plenty of vegetables, fruits, low-fat dairy products, and lean protein.  Do not eat a lot of foods high in solid fats, added sugars, or salt.  Get regular exercise. This is one of the most important things you can do for your health. ? Most adults should exercise for at least 150 minutes each week. The exercise should increase your heart rate and make you sweat (moderate-intensity exercise). ? Most adults should also do strengthening exercises at least twice a week. This is in addition to the moderate-intensity exercise.  Maintain a healthy weight  Body mass index (BMI) is a measurement that  can be used to identify possible weight problems. It estimates body fat based on height and weight. Your health care provider can help determine your BMI and help you achieve or maintain a healthy weight.  For females 74 years of age and older: ? A BMI below 18.5 is considered underweight. ? A BMI of 18.5 to 24.9 is normal. ? A BMI of 25 to 29.9 is considered overweight. ? A BMI of 30 and above is considered obese.  Watch levels of cholesterol and blood lipids  You should start having your blood tested for lipids and cholesterol at 66 years of age, then have this test every 5 years.  You may need to have your cholesterol levels checked more often if: ? Your lipid or cholesterol levels are high. ? You are older than 66 years of age. ? You are at high risk for heart disease.  Cancer screening Lung Cancer  Lung cancer screening is recommended for adults 16-80 years old who are at high risk for lung cancer because of a history of smoking.  A yearly low-dose CT scan of the lungs is recommended for people who: ? Currently smoke. ? Have quit within the past 15 years. ? Have at least a 30-pack-year history of smoking. A pack year is smoking an average of one pack of cigarettes a day for 1 year.  Yearly screening should continue until it has been 15 years since you quit.  Yearly screening should stop if you develop a health problem that would prevent you from having lung cancer treatment.  Breast Cancer  Practice breast self-awareness.  This means understanding how your breasts normally appear and feel.  It also means doing regular breast self-exams. Let your health care provider know about any changes, no matter how small.  If you are in your 20s or 30s, you should have a clinical breast exam (CBE) by a health care provider every 1-3 years as part of a regular health exam.  If you are 50 or older, have a CBE every year. Also consider having a breast X-ray (mammogram) every year.  If  you have a family history of breast cancer, talk to your health care provider about genetic screening.  If you are at high risk for breast cancer, talk to your health care provider about having an MRI and a mammogram every year.  Breast cancer gene (BRCA) assessment is recommended for women who have family members with BRCA-related cancers. BRCA-related cancers include: ? Breast. ? Ovarian. ? Tubal. ? Peritoneal cancers.  Results of the assessment will determine the need for genetic counseling and BRCA1 and BRCA2 testing.  Cervical Cancer Your health care provider may recommend that you be screened regularly for cancer of the pelvic organs (ovaries, uterus, and vagina). This screening involves a pelvic examination, including checking for microscopic changes to the surface of your cervix (Pap test). You may be encouraged to have this screening done every 3 years, beginning at age 1.  For women ages 17-65, health care providers may recommend pelvic exams and Pap testing every 3 years, or they may recommend the Pap and pelvic exam, combined with testing for human papilloma virus (HPV), every 5 years. Some types of HPV increase your risk of cervical cancer. Testing for HPV may also be done on women of any age with unclear Pap test results.  Other health care providers may not recommend any screening for nonpregnant women who are considered low risk for pelvic cancer and who do not have symptoms. Ask your health care provider if a screening pelvic exam is right for you.  If you have had past treatment for cervical cancer or a condition that could lead to cancer, you need Pap tests and screening for cancer for at least 20 years after your treatment. If Pap tests have been discontinued, your risk factors (such as having a new sexual partner) need to be reassessed to determine if screening should resume. Some women have medical problems that increase the chance of getting cervical cancer. In these cases,  your health care provider may recommend more frequent screening and Pap tests.  Colorectal Cancer  This type of cancer can be detected and often prevented.  Routine colorectal cancer screening usually begins at 66 years of age and continues through 66 years of age.  Your health care provider may recommend screening at an earlier age if you have risk factors for colon cancer.  Your health care provider may also recommend using home test kits to check for hidden blood in the stool.  A small camera at the end of a tube can be used to examine your colon directly (sigmoidoscopy or colonoscopy). This is done to check for the earliest forms of colorectal cancer.  Routine screening usually begins at age 61.  Direct examination of the colon should be repeated every 5-10 years through 66 years of age. However, you may need to be screened more often if early forms of precancerous polyps or small growths are found.  Skin Cancer  Check your skin from head to toe regularly.  Tell your health care provider about any  new moles or changes in moles, especially if there is a change in a mole's shape or color.  Also tell your health care provider if you have a mole that is larger than the size of a pencil eraser.  Always use sunscreen. Apply sunscreen liberally and repeatedly throughout the day.  Protect yourself by wearing long sleeves, pants, a wide-brimmed hat, and sunglasses whenever you are outside.  Heart disease, diabetes, and high blood pressure  High blood pressure causes heart disease and increases the risk of stroke. High blood pressure is more likely to develop in: ? People who have blood pressure in the high end of the normal range (130-139/85-89 mm Hg). ? People who are overweight or obese. ? People who are African American.  If you are 58-1 years of age, have your blood pressure checked every 3-5 years. If you are 52 years of age or older, have your blood pressure checked every year.  You should have your blood pressure measured twice-once when you are at a hospital or clinic, and once when you are not at a hospital or clinic. Record the average of the two measurements. To check your blood pressure when you are not at a hospital or clinic, you can use: ? An automated blood pressure machine at a pharmacy. ? A home blood pressure monitor.  If you are between 24 years and 65 years old, ask your health care provider if you should take aspirin to prevent strokes.  Have regular diabetes screenings. This involves taking a blood sample to check your fasting blood sugar level. ? If you are at a normal weight and have a low risk for diabetes, have this test once every three years after 66 years of age. ? If you are overweight and have a high risk for diabetes, consider being tested at a younger age or more often. Preventing infection Hepatitis B  If you have a higher risk for hepatitis B, you should be screened for this virus. You are considered at high risk for hepatitis B if: ? You were born in a country where hepatitis B is common. Ask your health care provider which countries are considered high risk. ? Your parents were born in a high-risk country, and you have not been immunized against hepatitis B (hepatitis B vaccine). ? You have HIV or AIDS. ? You use needles to inject street drugs. ? You live with someone who has hepatitis B. ? You have had sex with someone who has hepatitis B. ? You get hemodialysis treatment. ? You take certain medicines for conditions, including cancer, organ transplantation, and autoimmune conditions.  Hepatitis C  Blood testing is recommended for: ? Everyone born from [birth year] through 1963. ? Anyone with known risk factors for hepatitis C.  Sexually transmitted infections (STIs)  You should be screened for sexually transmitted infections (STIs) including gonorrhea and chlamydia if: ? You are sexually active and are younger than 66 years of  age. ? You are older than 66 years of age and your health care provider tells you that you are at risk for this type of infection. ? Your sexual activity has changed since you were last screened and you are at an increased risk for chlamydia or gonorrhea. Ask your health care provider if you are at risk.  If you do not have HIV, but are at risk, it may be recommended that you take a prescription medicine daily to prevent HIV infection. This is called pre-exposure prophylaxis (PrEP). You are considered at  risk if: ? You are sexually active and do not regularly use condoms or know the HIV status of your partner(s). ? You take drugs by injection. ? You are sexually active with a partner who has HIV.  Talk with your health care provider about whether you are at high risk of being infected with HIV. If you choose to begin PrEP, you should first be tested for HIV. You should then be tested every 3 months for as long as you are taking PrEP. Pregnancy  If you are premenopausal and you may become pregnant, ask your health care provider about preconception counseling.  If you may become pregnant, take 400 to 800 micrograms (mcg) of folic acid every day.  If you want to prevent pregnancy, talk to your health care provider about birth control (contraception). Osteoporosis and menopause  Osteoporosis is a disease in which the bones lose minerals and strength with aging. This can result in serious bone fractures. Your risk for osteoporosis can be identified using a bone density scan.  If you are 6 years of age or older, or if you are at risk for osteoporosis and fractures, ask your health care provider if you should be screened.  Ask your health care provider whether you should take a calcium or vitamin D supplement to lower your risk for osteoporosis.  Menopause may have certain physical symptoms and risks.  Hormone replacement therapy may reduce some of these symptoms and risks. Talk to your health  care provider about whether hormone replacement therapy is right for you. Follow these instructions at home:  Schedule regular health, dental, and eye exams.  Stay current with your immunizations.  Do not use any tobacco products including cigarettes, chewing tobacco, or electronic cigarettes.  If you are pregnant, do not drink alcohol.  If you are breastfeeding, limit how much and how often you drink alcohol.  Limit alcohol intake to no more than 1 drink per day for nonpregnant women. One drink equals 12 ounces of beer, 5 ounces of wine, or 1 ounces of hard liquor.  Do not use street drugs.  Do not share needles.  Ask your health care provider for help if you need support or information about quitting drugs.  Tell your health care provider if you often feel depressed.  Tell your health care provider if you have ever been abused or do not feel safe at home. This information is not intended to replace advice given to you by your health care provider. Make sure you discuss any questions you have with your health care provider. Document Released: 07/02/2011 Document Revised: 05/24/2016 Document Reviewed: 09/20/2015 Elsevier Interactive Patient Education  [employer].

## 2018-04-04 ENCOUNTER — Ambulatory Visit: Payer: Medicare Other

## 2018-04-04 ENCOUNTER — Other Ambulatory Visit (INDEPENDENT_AMBULATORY_CARE_PROVIDER_SITE_OTHER): Payer: Medicare Other

## 2018-04-04 ENCOUNTER — Encounter: Payer: Self-pay | Admitting: Internal Medicine

## 2018-04-04 ENCOUNTER — Ambulatory Visit (INDEPENDENT_AMBULATORY_CARE_PROVIDER_SITE_OTHER): Payer: Medicare Other | Admitting: Internal Medicine

## 2018-04-04 VITALS — BP 140/80 | HR 79 | Temp 97.8°F | Resp 16 | Wt 264.0 lb

## 2018-04-04 DIAGNOSIS — Z0000 Encounter for general adult medical examination without abnormal findings: Secondary | ICD-10-CM

## 2018-04-04 DIAGNOSIS — F3289 Other specified depressive episodes: Secondary | ICD-10-CM | POA: Diagnosis not present

## 2018-04-04 DIAGNOSIS — E7849 Other hyperlipidemia: Secondary | ICD-10-CM

## 2018-04-04 DIAGNOSIS — E119 Type 2 diabetes mellitus without complications: Secondary | ICD-10-CM

## 2018-04-04 DIAGNOSIS — I10 Essential (primary) hypertension: Secondary | ICD-10-CM

## 2018-04-04 DIAGNOSIS — M85852 Other specified disorders of bone density and structure, left thigh: Secondary | ICD-10-CM | POA: Diagnosis not present

## 2018-04-04 DIAGNOSIS — M85851 Other specified disorders of bone density and structure, right thigh: Secondary | ICD-10-CM

## 2018-04-04 LAB — LIPID PANEL
CHOLESTEROL: 174 mg/dL (ref 0–200)
HDL: 59.6 mg/dL (ref >39.00)
LDL CALC: 80 mg/dL (ref 0–99)
NonHDL: 114.77
TRIGLYCERIDES: 172 mg/dL — AB (ref 0.0–149.0)
Total CHOL/HDL Ratio: 3
VLDL: 34.4 mg/dL (ref 0.0–40.0)

## 2018-04-04 LAB — CBC WITH DIFFERENTIAL/PLATELET
BASOS ABS: 0.1 10*3/uL (ref 0.0–0.1)
Basophils Relative: 1.1 % (ref 0.0–3.0)
EOS ABS: 0.2 10*3/uL (ref 0.0–0.7)
Eosinophils Relative: 3.1 % (ref 0.0–5.0)
HEMATOCRIT: 41.7 % (ref 36.0–46.0)
HEMOGLOBIN: 13.9 g/dL (ref 12.0–15.0)
LYMPHS PCT: 19.4 % (ref 12.0–46.0)
Lymphs Abs: 1.3 10*3/uL (ref 0.7–4.0)
MCHC: 33.2 g/dL (ref 30.0–36.0)
MCV: 84.9 fl (ref 78.0–100.0)
Monocytes Absolute: 0.3 10*3/uL (ref 0.1–1.0)
Monocytes Relative: 4.9 % (ref 3.0–12.0)
Neutro Abs: 4.8 10*3/uL (ref 1.4–7.7)
Neutrophils Relative %: 71.5 % (ref 43.0–77.0)
Platelets: 192 10*3/uL (ref 150.0–400.0)
RBC: 4.91 Mil/uL (ref 3.87–5.11)
RDW: 15.6 % — ABNORMAL HIGH (ref 11.5–15.5)
WBC: 6.7 10*3/uL (ref 4.0–10.5)

## 2018-04-04 LAB — COMPREHENSIVE METABOLIC PANEL
ALBUMIN: 4.3 g/dL (ref 3.5–5.2)
ALT: 22 U/L (ref 0–35)
AST: 16 U/L (ref 0–37)
Alkaline Phosphatase: 101 U/L (ref 39–117)
BILIRUBIN TOTAL: 0.9 mg/dL (ref 0.2–1.2)
BUN: 18 mg/dL (ref 6–23)
CALCIUM: 9.7 mg/dL (ref 8.4–10.5)
CHLORIDE: 100 meq/L (ref 96–112)
CO2: 33 mEq/L — ABNORMAL HIGH (ref 19–32)
CREATININE: 0.55 mg/dL (ref 0.40–1.20)
GFR: 117.48 mL/min (ref >60.00)
Glucose, Bld: 115 mg/dL — ABNORMAL HIGH (ref 70–99)
Potassium: 4.7 mEq/L (ref 3.5–5.1)
Sodium: 141 mEq/L (ref 135–145)
Total Protein: 7.4 g/dL (ref 6.0–8.3)

## 2018-04-04 LAB — TSH: TSH: 1.96 u[IU]/mL (ref 0.35–4.50)

## 2018-04-04 LAB — VITAMIN D 25 HYDROXY (VIT D DEFICIENCY, FRACTURES): VITD: 14.06 ng/mL — ABNORMAL LOW (ref 30.00–100.00)

## 2018-04-04 LAB — HEMOGLOBIN A1C: Hgb A1c MFr Bld: 6.4 % (ref 4.6–6.5)

## 2018-04-04 MED ORDER — AZELASTINE-FLUTICASONE 137-50 MCG/ACT NA SUSP: 1.0000 | Freq: Two times a day (BID) | NASAL | 11 refills | Status: DC

## 2018-04-04 NOTE — Assessment & Plan Note (Signed)
dexa up to date Taking tums daily

## 2018-04-04 NOTE — Assessment & Plan Note (Signed)
BP well controlled Current regimen effective and well tolerated Continue current medications at current doses cmp  

## 2018-04-04 NOTE — Assessment & Plan Note (Signed)
Controlled, stable Continue current dose of medication  

## 2018-04-04 NOTE — Assessment & Plan Note (Signed)
Check lipid panel  Continue daily statin Regular exercise and healthy diet encouraged  

## 2018-04-04 NOTE — Assessment & Plan Note (Signed)
Check a1c Low sugar / carb diet Stressed regular exercise, weight loss  

## 2018-04-06 ENCOUNTER — Encounter: Payer: Self-pay | Admitting: Internal Medicine

## 2018-04-09 ENCOUNTER — Encounter: Payer: Self-pay | Admitting: Internal Medicine

## 2018-04-10 ENCOUNTER — Ambulatory Visit (INDEPENDENT_AMBULATORY_CARE_PROVIDER_SITE_OTHER): Payer: Medicare Other | Admitting: *Deleted

## 2018-04-10 VITALS — BP 134/88 | HR 78 | Resp 18 | Ht 62.0 in | Wt 264.0 lb

## 2018-04-10 DIAGNOSIS — Z0000 Encounter for general adult medical examination without abnormal findings: Secondary | ICD-10-CM | POA: Diagnosis not present

## 2018-04-10 NOTE — Progress Notes (Addendum)
Subjective:   Jane Montgomery is a 68 y.o. female who presents for Medicare Annual (Subsequent) preventive examination.  Review of Systems:  No ROS.  Medicare Wellness Visit. Additional risk factors are reflected in the social history.    Sleep patterns: gets up 1-2 times nightly to void and sleeps 7 hours nightly.    Home Safety/Smoke Alarms: Feels safe in home. Smoke alarms in place.  Living environment; residence and Firearm Safety: 1-story house/ trailer, no firearms.Lives with husband, no needs for DME, good support system Seat Belt Safety/Bike Helmet: Wears seat belt.       Objective:     Vitals: There were no vitals taken for this visit.  There is no height or weight on file to calculate BMI.  Advanced Directives 10/14/2015 10/05/2015 10/04/2015 09/14/2015  Does Patient Have a Medical Advance Directive? No No No No  Would patient like information on creating a medical advance directive? Yes - Educational materials given No - patient declined information No - patient declined information -    Tobacco Social History   Tobacco Use  Smoking Status Never Smoker  Smokeless Tobacco Never Used     Counseling given: Not Answered   Past Medical History:  Diagnosis Date  . 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 Arthritis   . Benign positional vertigo    in [place]  . Chronic back pain    reason unknown   . Depression    takes Zoloft daily  . Diarrhea   . Diverticulosis   . Gestational diabetes 1982, 1984   "pre"  . Gilbert syndrome   . Hyperlipidemia    takes Pravastatin daily  . Hypertension    takes Amlodipine daily as well as Benazepril  . Joint pain   . Macular degeneration    early stages and dry  . Nocturia   . Shortness of breath dyspnea    allergy related and occasionally will take Claritin.  . Tremor, essential    Dr Erling Cruz   Past Surgical History:  Procedure Laterality Date  . CATARACT EXTRACTION Bilateral 2015  . CESAREAN SECTION     x 2  .  COLONOSCOPY  [id number]   Negative, redundant colon; [facility]  . DILATION AND CURETTAGE OF UTERUS     Dr Irven [place]  . PLANTAR FASCIA SURGERY Bilateral   . TONSILLECTOMY    . TOTAL SHOULDER ARTHROPLASTY Right 10/13/2015   Procedure: TOTAL SHOULDER ARTHROPLASTY;  Surgeon: [name], MD;  Location: [place];  Service: Orthopedics;  Laterality: Right;  Right total shoulder arthroplasty   Family History  Problem Relation Age of Onset  . Hypertension Father   . Heart attack Father 12       CABG   . Colon cancer Mother 34  . Macular degeneration Mother   . Hypertension Mother   . Seizures Mother        ? etiology (preceded CVA)  . Stroke Mother 37  . Osteoporosis Mother   . Myelodysplastic syndrome Mother   . Colon cancer Maternal Grandmother   . Colon cancer Maternal Aunt   . Colon cancer Unknown        2 Maternal Great Aunts  . Heart attack Paternal Grandfather        > 55  . Heart attack Paternal Aunt 83  . Leukemia Paternal Grandmother   . Diabetes Neg Hx    Social History   Socioeconomic History  . Marital status: Married  Spouse name: Not on file  . Number of children: Not on file  . Years of education: Not on file  . Highest education level: Not on file  Occupational History  . Not on file  Social Needs  . Financial resource strain: Not on file  . Food insecurity:    Worry: Not on file    Inability: Not on file  . Transportation needs:    Medical: Not on file    Non-medical: Not on file  Tobacco Use  . Smoking status: Never Smoker  . Smokeless tobacco: Never Used  Substance and Sexual Activity  . Alcohol use: No  . Drug use: No  . Sexual activity: Yes    Birth control/protection: Post-menopausal  Lifestyle  . Physical activity:    Days per week: Not on file    Minutes per session: Not on file  . Stress: Not on file  Relationships  . Social connections:    Talks on phone: Not on file    Gets together: Not on file    Attends religious  service: Not on file    Active member of club or organization: Not on file    Attends meetings of clubs or organizations: Not on file    Relationship status: Not on file  Other Topics Concern  . Not on file  Social History Narrative  . Not on file    Outpatient Encounter Medications as of 04/10/2018  Medication Sig  . amLODipine (NORVASC) 5 MG tablet TAKE 1 TABLET(5 MG) BY MOUTH EVERY MORNING  . amoxicillin (AMOXIL) 500 MG tablet TK 4 TS PO 1 HOUR PRIOR TO APPOINTMENT  . aspirin 81 MG tablet Take 81 mg by mouth daily.    . Azelastine-Fluticasone 137-50 MCG/ACT SUSP Place 1 spray into the nose 2 (two) times daily.  . benazepril (LOTENSIN) 20 MG tablet TAKE 1 TABLET(20 MG) BY MOUTH DAILY  . Biotin 7500 MCG TABS Take 1 tablet by mouth daily.  . Multiple Vitamins-Minerals (ICAPS PO) Take 1 tablet by mouth 2 (two) times daily.   . pravastatin (PRAVACHOL) 40 MG tablet TAKE 1 TABLET(40 MG) BY MOUTH AT BEDTIME  . sertraline (ZOLOFT) 100 MG tablet TAKE 1 TABLET(100 MG) BY MOUTH DAILY   No facility-administered encounter medications on file as of 04/10/2018.     Activities of Daily Living No flowsheet data found.  Patient Care Team: Binnie Rail, MD as PCP - General (Internal Medicine)    Assessment:   This is a routine wellness examination for Jane Montgomery. Physical assessment deferred to PCP.   Exercise Activities and Dietary recommendations   Diet (meal preparation, eat out, water intake, caffeinated beverages, dairy products, fruits and vegetables): in general, a "healthy" diet  , well balanced   Reviewed heart healthy and diabetic diet, encouraged patient to increase daily water intake. Discussed weight loss strategies. Diet education was provided via handout.  Goals    None      Fall Risk Fall Risk  10/03/2017 04/03/2017 08/19/2013  Falls in the past year? No No No    Depression Screen PHQ 2/9 Scores 10/03/2017 04/03/2017 08/19/2013  PHQ - 2 Score 0 0 0     Cognitive Function         Ad8 score reviewed for issues:  Issues making decisions: no  Less interest in hobbies / activities: no  Repeats questions, stories (family complaining): no  Trouble using ordinary gadgets (microwave, computer, phone):no  Forgets the month or year: no  Mismanaging finances: no  Remembering appts: no  Daily problems with thinking and/or memory: no Ad8 score is= 0  Immunization History  Administered Date(s) Administered  . Influenza Split 01/23/2012, 09/08/2014  . Influenza Whole 10/02/2008  . Influenza, High Dose Seasonal PF 09/26/2017  . Influenza-Unspecified 10/14/2013, 10/01/2015, 09/20/2016, 09/26/2017  . Pneumococcal Conjugate-13 09/17/2012  . Pneumococcal Polysaccharide-23 04/03/2017  . Tdap 08/13/2012  . Zoster 10/14/2013      Screening Tests Health Maintenance  Topic Date Due  . INFLUENZA VACCINE  07/31/2018  . OPHTHALMOLOGY EXAM  10/01/2018  . FOOT EXAM  10/03/2018  . HEMOGLOBIN A1C  10/04/2018  . MAMMOGRAM  08/25/2019  . DEXA SCAN  04/11/2020  . COLONOSCOPY  07/10/2020  . TETANUS/TDAP  08/13/2022  . Hepatitis C Screening  Completed  . PNA vac Low Risk Adult  Completed        Plan:    Continue doing brain stimulating activities (puzzles, reading, adult coloring books, staying active) to keep memory sharp.   Continue to eat heart healthy diet (full of fruits, vegetables, whole grains, lean protein, water--limit salt, fat, and sugar intake) and increase physical activity as tolerated.  I have personally reviewed and noted the following in the patient's chart:   . Medical and social history . Use of alcohol, tobacco or illicit drugs  . Current medications and supplements . Functional ability and status . Nutritional status . Physical activity . Advanced directives . List of other physicians . Vitals . Screenings to include cognitive, depression, and falls . Referrals and appointments  In addition, I have reviewed and discussed with  patient certain preventive protocols, quality metrics, and best practice recommendations. A written personalized care plan for preventive services as well as general preventive health recommendations were provided to patient.     Michiel Cowboy, RN  04/10/2018    Medical screening examination/treatment/procedure(s) were performed by non-physician practitioner and as supervising physician I was immediately available for consultation/collaboration. I agree with above. Binnie Rail, MD

## 2018-04-10 NOTE — Patient Instructions (Addendum)
Continue doing brain stimulating activities (puzzles, reading, adult coloring books, staying active) to keep memory sharp.   Continue to eat heart healthy diet (full of fruits, vegetables, whole grains, lean protein, water--limit salt, fat, and sugar intake) and increase physical activity as tolerated.   Jane Montgomery , Thank you for taking time to come for your Medicare Wellness Visit. I appreciate your ongoing commitment to your health goals. Please review the following plan we discussed and let me know if I can assist you in the future.   These are the goals we discussed: Goals    . Patient Stated     Increase my physical activity I will exercise on a routine basis 3 times weekly. I will do chair exercise while watching TV. Continue to do portion control, cut down on soda, and drink water.       This is a list of the screening recommended for you and due dates:  Health Maintenance  Topic Date Due  . Flu Shot  07/31/2018  . Eye exam for diabetics  10/01/2018  . Complete foot exam   10/03/2018  . Hemoglobin A1C  10/04/2018  . Mammogram  08/25/2019  . DEXA scan (bone density measurement)  04/11/2020  . Colon Cancer Screening  07/10/2020  . Tetanus Vaccine  08/13/2022  .  Hepatitis C: One time screening is recommended by Center for Disease Control  (CDC) for  adults born from [birth year] through 1963.   Completed  . Pneumonia vaccines  Completed     Health Maintenance, Female Adopting a healthy lifestyle and getting preventive care can go a long way to promote health and wellness. Talk with your health care provider about what schedule of regular examinations is right for you. This is a good chance for you to check in with your provider about disease prevention and staying healthy. In between checkups, there are plenty of things you can do on your own. Experts have done a lot of research about which lifestyle changes and preventive measures are most likely to keep you healthy. Ask your health  care provider for more information. Weight and diet Eat a healthy diet  Be sure to include plenty of vegetables, fruits, low-fat dairy products, and lean protein.  Do not eat a lot of foods high in solid fats, added sugars, or salt.  Get regular exercise. This is one of the most important things you can do for your health. ? Most adults should exercise for at least 150 minutes each week. The exercise should increase your heart rate and make you sweat (moderate-intensity exercise). ? Most adults should also do strengthening exercises at least twice a week. This is in addition to the moderate-intensity exercise.  Maintain a healthy weight  Body mass index (BMI) is a measurement that can be used to identify possible weight problems. It estimates body fat based on height and weight. Your health care provider can help determine your BMI and help you achieve or maintain a healthy weight.  For females 28 years of age and older: ? A BMI below 18.5 is considered underweight. ? A BMI of 18.5 to 24.9 is normal. ? A BMI of 25 to 29.9 is considered overweight. ? A BMI of 30 and above is considered obese.  Watch levels of cholesterol and blood lipids  You should start having your blood tested for lipids and cholesterol at 66 years of age, then have this test every 5 years.  You may need to have your cholesterol levels checked  more often if: ? Your lipid or cholesterol levels are high. ? You are older than 66 years of age. ? You are at high risk for heart disease.  Cancer screening Lung Cancer  Lung cancer screening is recommended for adults 32-77 years old who are at high risk for lung cancer because of a history of smoking.  A yearly low-dose CT scan of the lungs is recommended for people who: ? Currently smoke. ? Have quit within the past 15 years. ? Have at least a 30-pack-year history of smoking. A pack year is smoking an average of one pack of cigarettes a day for 1 year.  Yearly  screening should continue until it has been 15 years since you quit.  Yearly screening should stop if you develop a health problem that would prevent you from having lung cancer treatment.  Breast Cancer  Practice breast self-awareness. This means understanding how your breasts normally appear and feel.  It also means doing regular breast self-exams. Let your health care provider know about any changes, no matter how small.  If you are in your 20s or 30s, you should have a clinical breast exam (CBE) by a health care provider every 1-3 years as part of a regular health exam.  If you are 64 or older, have a CBE every year. Also consider having a breast X-ray (mammogram) every year.  If you have a family history of breast cancer, talk to your health care provider about genetic screening.  If you are at high risk for breast cancer, talk to your health care provider about having an MRI and a mammogram every year.  Breast cancer gene (BRCA) assessment is recommended for women who have family members with BRCA-related cancers. BRCA-related cancers include: ? Breast. ? Ovarian. ? Tubal. ? Peritoneal cancers.  Results of the assessment will determine the need for genetic counseling and BRCA1 and BRCA2 testing.  Cervical Cancer Your health care provider may recommend that you be screened regularly for cancer of the pelvic organs (ovaries, uterus, and vagina). This screening involves a pelvic examination, including checking for microscopic changes to the surface of your cervix (Pap test). You may be encouraged to have this screening done every 3 years, beginning at age 3.  For women ages 27-65, health care providers may recommend pelvic exams and Pap testing every 3 years, or they may recommend the Pap and pelvic exam, combined with testing for human papilloma virus (HPV), every 5 years. Some types of HPV increase your risk of cervical cancer. Testing for HPV may also be done on women of any age  with unclear Pap test results.  Other health care providers may not recommend any screening for nonpregnant women who are considered low risk for pelvic cancer and who do not have symptoms. Ask your health care provider if a screening pelvic exam is right for you.  If you have had past treatment for cervical cancer or a condition that could lead to cancer, you need Pap tests and screening for cancer for at least 20 years after your treatment. If Pap tests have been discontinued, your risk factors (such as having a new sexual partner) need to be reassessed to determine if screening should resume. Some women have medical problems that increase the chance of getting cervical cancer. In these cases, your health care provider may recommend more frequent screening and Pap tests.  Colorectal Cancer  This type of cancer can be detected and often prevented.  Routine colorectal cancer screening usually  begins at 66 years of age and continues through 66 years of age.  Your health care provider may recommend screening at an earlier age if you have risk factors for colon cancer.  Your health care provider may also recommend using home test kits to check for hidden blood in the stool.  A small camera at the end of a tube can be used to examine your colon directly (sigmoidoscopy or colonoscopy). This is done to check for the earliest forms of colorectal cancer.  Routine screening usually begins at age 69.  Direct examination of the colon should be repeated every 5-10 years through 66 years of age. However, you may need to be screened more often if early forms of precancerous polyps or small growths are found.  Skin Cancer  Check your skin from head to toe regularly.  Tell your health care provider about any new moles or changes in moles, especially if there is a change in a mole's shape or color.  Also tell your health care provider if you have a mole that is larger than the size of a pencil  eraser.  Always use sunscreen. Apply sunscreen liberally and repeatedly throughout the day.  Protect yourself by wearing long sleeves, pants, a wide-brimmed hat, and sunglasses whenever you are outside.  Heart disease, diabetes, and high blood pressure  High blood pressure causes heart disease and increases the risk of stroke. High blood pressure is more likely to develop in: ? People who have blood pressure in the high end of the normal range (130-139/85-89 mm Hg). ? People who are overweight or obese. ? People who are African American.  If you are 42-10 years of age, have your blood pressure checked every 3-5 years. If you are 20 years of age or older, have your blood pressure checked every year. You should have your blood pressure measured twice-once when you are at a hospital or clinic, and once when you are not at a hospital or clinic. Record the average of the two measurements. To check your blood pressure when you are not at a hospital or clinic, you can use: ? An automated blood pressure machine at a pharmacy. ? A home blood pressure monitor.  If you are between 41 years and 37 years old, ask your health care provider if you should take aspirin to prevent strokes.  Have regular diabetes screenings. This involves taking a blood sample to check your fasting blood sugar level. ? If you are at a normal weight and have a low risk for diabetes, have this test once every three years after 66 years of age. ? If you are overweight and have a high risk for diabetes, consider being tested at a younger age or more often. Preventing infection Hepatitis B  If you have a higher risk for hepatitis B, you should be screened for this virus. You are considered at high risk for hepatitis B if: ? You were born in a country where hepatitis B is common. Ask your health care provider which countries are considered high risk. ? Your parents were born in a high-risk country, and you have not been immunized  against hepatitis B (hepatitis B vaccine). ? You have HIV or AIDS. ? You use needles to inject street drugs. ? You live with someone who has hepatitis B. ? You have had sex with someone who has hepatitis B. ? You get hemodialysis treatment. ? You take certain medicines for conditions, including cancer, organ transplantation, and autoimmune conditions.  Hepatitis C  Blood testing is recommended for: ? Everyone born from [birth year] through 1963. ? Anyone with known risk factors for hepatitis C.  Sexually transmitted infections (STIs)  You should be screened for sexually transmitted infections (STIs) including gonorrhea and chlamydia if: ? You are sexually active and are younger than 66 years of age. ? You are older than 66 years of age and your health care provider tells you that you are at risk for this type of infection. ? Your sexual activity has changed since you were last screened and you are at an increased risk for chlamydia or gonorrhea. Ask your health care provider if you are at risk.  If you do not have HIV, but are at risk, it may be recommended that you take a prescription medicine daily to prevent HIV infection. This is called pre-exposure prophylaxis (PrEP). You are considered at risk if: ? You are sexually active and do not regularly use condoms or know the HIV status of your partner(s). ? You take drugs by injection. ? You are sexually active with a partner who has HIV.  Talk with your health care provider about whether you are at high risk of being infected with HIV. If you choose to begin PrEP, you should first be tested for HIV. You should then be tested every 3 months for as long as you are taking PrEP. Pregnancy  If you are premenopausal and you may become pregnant, ask your health care provider about preconception counseling.  If you may become pregnant, take 400 to 800 micrograms (mcg) of folic acid every day.  If you want to prevent pregnancy, talk to your health  care provider about birth control (contraception). Osteoporosis and menopause  Osteoporosis is a disease in which the bones lose minerals and strength with aging. This can result in serious bone fractures. Your risk for osteoporosis can be identified using a bone density scan.  If you are 86 years of age or older, or if you are at risk for osteoporosis and fractures, ask your health care provider if you should be screened.  Ask your health care provider whether you should take a calcium or vitamin D supplement to lower your risk for osteoporosis.  Menopause may have certain physical symptoms and risks.  Hormone replacement therapy may reduce some of these symptoms and risks. Talk to your health care provider about whether hormone replacement therapy is right for you. Follow these instructions at home:  Schedule regular health, dental, and eye exams.  Stay current with your immunizations.  Do not use any tobacco products including cigarettes, chewing tobacco, or electronic cigarettes.  If you are pregnant, do not drink alcohol.  If you are breastfeeding, limit how much and how often you drink alcohol.  Limit alcohol intake to no more than 1 drink per day for nonpregnant women. One drink equals 12 ounces of beer, 5 ounces of Clifton Kovacic, or 1 ounces of hard liquor.  Do not use street drugs.  Do not share needles.  Ask your health care provider for help if you need support or information about quitting drugs.  Tell your health care provider if you often feel depressed.  Tell your health care provider if you have ever been abused or do not feel safe at home. This information is not intended to replace advice given to you by your health care provider. Make sure you discuss any questions you have with your health care provider. Document Released: 07/02/2011 Document Revised: 05/24/2016 Document Reviewed: 09/20/2015  Elsevier Interactive Patient Education  [employer].  Knee  Exercises Ask your health care provider which exercises are safe for you. Do exercises exactly as told by your health care provider and adjust them as directed. It is normal to feel mild stretching, pulling, tightness, or discomfort as you do these exercises, but you should stop right away if you feel sudden pain or your pain gets worse.Do not begin these exercises until told by your health care provider. STRETCHING AND RANGE OF MOTION EXERCISES These exercises warm up your muscles and joints and improve the movement and flexibility of your knee. These exercises also help to relieve pain, numbness, and tingling. Exercise A: Knee Extension, Prone 1. Lie on your abdomen on a bed. 2. Place your left / right knee just beyond the edge of the surface so your knee is not on the bed. You can put a towel under your left / right thigh just above your knee for comfort. 3. Relax your leg muscles and allow gravity to straighten your knee. You should feel a stretch behind your left / right knee. 4. Hold this position for __________ seconds. 5. Scoot up so your knee is supported between repetitions. Repeat __________ times. Complete this stretch __________ times a day. Exercise B: Knee Flexion, Active  1. Lie on your back with both knees straight. If this causes back discomfort, bend your left / right knee so your foot is flat on the floor. 2. Slowly slide your left / right heel back toward your buttocks until you feel a gentle stretch in the front of your knee or thigh. 3. Hold this position for __________ seconds. 4. Slowly slide your left / right heel back to the starting position. Repeat __________ times. Complete this exercise __________ times a day. Exercise C: Quadriceps, Prone  1. Lie on your abdomen on a firm surface, such as a bed or padded floor. 2. Bend your left / right knee and hold your ankle. If you cannot reach your ankle or pant leg, loop a belt around your foot and grab the belt  instead. 3. Gently pull your heel toward your buttocks. Your knee should not slide out to the side. You should feel a stretch in the front of your thigh and knee. 4. Hold this position for __________ seconds. Repeat __________ times. Complete this stretch __________ times a day. Exercise D: Hamstring, Supine 1. Lie on your back. 2. Loop a belt or towel over the ball of your left / right foot. The ball of your foot is on the walking surface, right under your toes. 3. Straighten your left / right knee and slowly pull on the belt to raise your leg until you feel a gentle stretch behind your knee. ? Do not let your left / right knee bend while you do this. ? Keep your other leg flat on the floor. 4. Hold this position for __________ seconds. Repeat __________ times. Complete this stretch __________ times a day. STRENGTHENING EXERCISES These exercises build strength and endurance in your knee. Endurance is the ability to use your muscles for a long time, even after they get tired. Exercise E: Quadriceps, Isometric  1. Lie on your back with your left / right leg extended and your other knee bent. Put a rolled towel or small pillow under your knee if told by your health care provider. 2. Slowly tense the muscles in the front of your left / right thigh. You should see your kneecap slide up toward your  hip or see increased dimpling just above the knee. This motion will push the back of the knee toward the floor. 3. For __________ seconds, keep the muscle as tight as you can without increasing your pain. 4. Relax the muscles slowly and completely. Repeat __________ times. Complete this exercise __________ times a day. Exercise F: Straight Leg Raises - Quadriceps 1. Lie on your back with your left / right leg extended and your other knee bent. 2. Tense the muscles in the front of your left / right thigh. You should see your kneecap slide up or see increased dimpling just above the knee. Your thigh may  even shake a bit. 3. Keep these muscles tight as you raise your leg 4-6 inches (10-15 cm) off the floor. Do not let your knee bend. 4. Hold this position for __________ seconds. 5. Keep these muscles tense as you lower your leg. 6. Relax your muscles slowly and completely after each repetition. Repeat __________ times. Complete this exercise __________ times a day. Exercise G: Hamstring, Isometric 1. Lie on your back on a firm surface. 2. Bend your left / right knee approximately __________ degrees. 3. Dig your left / right heel into the surface as if you are trying to pull it toward your buttocks. Tighten the muscles in the back of your thighs to dig as hard as you can without increasing any pain. 4. Hold this position for __________ seconds. 5. Release the tension gradually and allow your muscles to relax completely for __________ seconds after each repetition. Repeat __________ times. Complete this exercise __________ times a day. Exercise H: Hamstring Curls  If told by your health care provider, do this exercise while wearing ankle weights. Begin with __________ weights. Then increase the weight by 1 lb (0.5 kg) increments. Do not wear ankle weights that are more than __________. 1. Lie on your abdomen with your legs straight. 2. Bend your left / right knee as far as you can without feeling pain. Keep your hips flat against the floor. 3. Hold this position for __________ seconds. 4. Slowly lower your leg to the starting position.  Repeat __________ times. Complete this exercise __________ times a day. Exercise I: Squats (Quadriceps) 1. Stand in front of a table, with your feet and knees pointing straight ahead. You may rest your hands on the table for balance but not for support. 2. Slowly bend your knees and lower your hips like you are going to sit in a chair. ? Keep your weight over your heels, not over your toes. ? Keep your lower legs upright so they are parallel with the table  legs. ? Do not let your hips go lower than your knees. ? Do not bend lower than told by your health care provider. ? If your knee pain increases, do not bend as low. 3. Hold the squat position for __________ seconds. 4. Slowly push with your legs to return to standing. Do not use your hands to pull yourself to standing. Repeat __________ times. Complete this exercise __________ times a day. Exercise J: Wall Slides (Quadriceps)  1. Lean your back against a smooth wall or door while you walk your feet out 18-24 inches (46-61 cm) from it. 2. Place your feet hip-width apart. 3. Slowly slide down the wall or door until your knees bend __________ degrees. Keep your knees over your heels, not over your toes. Keep your knees in line with your hips. 4. Hold for __________ seconds. Repeat __________ times. Complete this exercise  __________ times a day. Exercise K: Straight Leg Raises - Hip Abductors 1. Lie on your side with your left / right leg in the top position. Lie so your head, shoulder, knee, and hip line up. You may bend your bottom knee to help you keep your balance. 2. Roll your hips slightly forward so your hips are stacked directly over each other and your left / right knee is facing forward. 3. Leading with your heel, lift your top leg 4-6 inches (10-15 cm). You should feel the muscles in your outer hip lifting. ? Do not let your foot drift forward. ? Do not let your knee roll toward the ceiling. 4. Hold this position for __________ seconds. 5. Slowly return your leg to the starting position. 6. Let your muscles relax completely after each repetition. Repeat __________ times. Complete this exercise __________ times a day. Exercise L: Straight Leg Raises - Hip Extensors 1. Lie on your abdomen on a firm surface. You can put a pillow under your hips if that is more comfortable. 2. Tense the muscles in your buttocks and lift your left / right leg about 4-6 inches (10-15 cm). Keep your knee  straight as you lift your leg. 3. Hold this position for __________ seconds. 4. Slowly lower your leg to the starting position. 5. Let your leg relax completely after each repetition. Repeat __________ times. Complete this exercise __________ times a day. This information is not intended to replace advice given to you by your health care provider. Make sure you discuss any questions you have with your health care provider. Document Released: 10/31/2005 Document Revised: 09/10/2016 Document Reviewed: 10/23/2015 Elsevier Interactive Patient Education  2018 [employer].

## 2018-04-30 ENCOUNTER — Encounter: Payer: Self-pay | Admitting: Gastroenterology

## 2018-06-22 ENCOUNTER — Other Ambulatory Visit: Payer: Self-pay | Admitting: Internal Medicine

## 2018-06-24 ENCOUNTER — Other Ambulatory Visit: Payer: Self-pay | Admitting: Internal Medicine

## 2018-06-30 ENCOUNTER — Ambulatory Visit (AMBULATORY_SURGERY_CENTER): Payer: Self-pay | Admitting: *Deleted

## 2018-06-30 ENCOUNTER — Other Ambulatory Visit: Payer: Self-pay

## 2018-06-30 VITALS — Ht 62.0 in | Wt 261.0 lb

## 2018-06-30 DIAGNOSIS — Z800 Family history of malignant neoplasm of digestive organs: Secondary | ICD-10-CM

## 2018-06-30 MED ORDER — PEG 3350-KCL-NA BICARB-NACL 420 G PO SOLR: 4000.0000 mL | Freq: Once | ORAL | 0 refills | Status: AC

## 2018-06-30 NOTE — Progress Notes (Signed)
  No egg or soy allergy known to patient   issues with past sedation with any surgeries  or procedures of hard to wake post op, no intubation problems  No diet pills per patient No home 02 use per patient  No blood thinners per patient  Pt denies issues with constipation  No A fib or A flutter  EMMI video sent to pt's e mail - pt declined

## 2018-07-02 ENCOUNTER — Encounter: Payer: Self-pay | Admitting: Gastroenterology

## 2018-07-14 ENCOUNTER — Ambulatory Visit (AMBULATORY_SURGERY_CENTER): Payer: Medicare Other | Admitting: Gastroenterology

## 2018-07-14 ENCOUNTER — Other Ambulatory Visit: Payer: Self-pay

## 2018-07-14 ENCOUNTER — Encounter: Payer: Self-pay | Admitting: Gastroenterology

## 2018-07-14 VITALS — BP 117/72 | HR 78 | Temp 98.6°F | Resp 16 | Ht 62.4 in | Wt 261.0 lb

## 2018-07-14 DIAGNOSIS — K635 Polyp of colon: Secondary | ICD-10-CM

## 2018-07-14 DIAGNOSIS — D123 Benign neoplasm of transverse colon: Secondary | ICD-10-CM

## 2018-07-14 DIAGNOSIS — K5730 Diverticulosis of large intestine without perforation or abscess without bleeding: Secondary | ICD-10-CM

## 2018-07-14 DIAGNOSIS — Z800 Family history of malignant neoplasm of digestive organs: Secondary | ICD-10-CM

## 2018-07-14 DIAGNOSIS — Z1211 Encounter for screening for malignant neoplasm of colon: Secondary | ICD-10-CM

## 2018-07-14 MED ORDER — SODIUM CHLORIDE 0.9 % IV SOLN: 500.0000 mL | Freq: Once | INTRAVENOUS | Status: DC

## 2018-07-14 NOTE — Op Note (Signed)
[place] [employer] Patient Name: Jane Montgomery Procedure Date: 07/14/2018 8:15 AM MRN: [id number] Endoscopist: Milus Banister , MD Age: 68 Referring MD:  Date of Birth: Apr 19, 1950 Gender: Female Account #: [id number] Procedure:                Colonoscopy Indications:              Screening in patient at increased risk: Family                            history of 1st-degree relative with colorectal                            cancer (mother and others on her side of the family                            had colon cancer) Medicines:                Monitored Anesthesia Care Procedure:                Pre-Anesthesia Assessment:                           - Prior to the procedure, a History and Physical                            was performed, and patient medications and                            allergies were reviewed. The patient's tolerance of                            previous anesthesia was also reviewed. The risks                            and benefits of the procedure and the sedation                            options and risks were discussed with the patient.                            All questions were answered, and informed consent                            was obtained. Prior Anticoagulants: The patient has                            taken no previous anticoagulant or antiplatelet                            agents. ASA Grade Assessment: III - A patient with                            severe systemic disease. After reviewing the risks  and benefits, the patient was deemed in                            satisfactory condition to undergo the procedure.                           After obtaining informed consent, the colonoscope                            was passed under direct vision. Throughout the                            procedure, the patient's blood pressure, pulse, and                            oxygen saturations were monitored  continuously. The                            Colonoscope was introduced through the anus and                            advanced to the the cecum, identified by                            appendiceal orifice and ileocecal valve. The                            colonoscopy was performed without difficulty. The                            patient tolerated the procedure well. The quality                            of the bowel preparation was good. The ileocecal                            valve, appendiceal orifice, and rectum were                            photographed. Scope In: 8:18:31 AM Scope Out: 8:34:17 AM Scope Withdrawal Time: 0 hours 10 minutes 46 seconds  Total Procedure Duration: 0 hours 15 minutes 46 seconds  Findings:                 A 3 mm polyp was found in the transverse colon. The                            polyp was sessile. The polyp was removed with a                            cold snare. Resection and retrieval were complete.                           Multiple small and large-mouthed diverticula were  found in the left colon.                           The exam was otherwise without abnormality on                            direct and retroflexion views. Complications:            No immediate complications. Estimated blood loss:                            None. Estimated Blood Loss:     Estimated blood loss: none. Impression:               - One 3 mm polyp in the transverse colon, removed                            with a cold snare. Resected and retrieved.                           - Diverticulosis in the left colon.                           - The examination was otherwise normal on direct                            and retroflexion views. Recommendation:           - Patient has a contact number available for                            emergencies. The signs and symptoms of potential                            delayed complications were  discussed with the                            patient. Return to normal activities tomorrow.                            Written discharge instructions were provided to the                            patient.                           - Resume previous diet.                           - Continue present medications.                           You will receive a letter within 2-3 weeks with the                            pathology results and my final recommendations.  If the polyp(s) is proven to be 'pre-cancerous' on                            pathology, you will need repeat colonoscopy in 5                            years. Milus Banister, MD 07/14/2018 8:36:29 AM This report has been signed electronically.

## 2018-07-14 NOTE — Progress Notes (Signed)
Report given to PACU, vss 

## 2018-07-14 NOTE — Progress Notes (Signed)
Called to room to assist during endoscopic procedure.  Patient ID and intended procedure confirmed with present staff. Received instructions for my participation in the procedure from the performing physician.  

## 2018-07-14 NOTE — Patient Instructions (Signed)
*   handout on polyps and diverticulosis given*  YOU HAD AN ENDOSCOPIC PROCEDURE TODAY AT THE [facility]:   Refer to the procedure report that was given to you for any specific questions about what was found during the examination.  If the procedure report does not answer your questions, please call your gastroenterologist to clarify.  If you requested that your care partner not be given the details of your procedure findings, then the procedure report has been included in a sealed envelope for you to review at your convenience later.  YOU SHOULD EXPECT: Some feelings of bloating in the abdomen. Passage of more gas than usual.  Walking can help get rid of the air that was put into your GI tract during the procedure and reduce the bloating. If you had a lower endoscopy (such as a colonoscopy or flexible sigmoidoscopy) you may notice spotting of blood in your stool or on the toilet paper. If you underwent a bowel prep for your procedure, you may not have a normal bowel movement for a few days.  Please Note:  You might notice some irritation and congestion in your nose or some drainage.  This is from the oxygen used during your procedure.  There is no need for concern and it should clear up in a day or so.  SYMPTOMS TO REPORT IMMEDIATELY:   Following lower endoscopy (colonoscopy or flexible sigmoidoscopy):  Excessive amounts of blood in the stool  Significant tenderness or worsening of abdominal pains  Swelling of the abdomen that is new, acute  Fever of 100F or higher   For urgent or emergent issues, a gastroenterologist can be reached at any hour by calling [phone].   DIET:  We do recommend a small meal at first, but then you may proceed to your regular diet.  Drink plenty of fluids but you should avoid alcoholic beverages for 24 hours.  ACTIVITY:  You should plan to take it easy for the rest of today and you should NOT DRIVE or use heavy machinery until tomorrow (because of  the sedation medicines used during the test).    FOLLOW UP: Our staff will call the number listed on your records the next business day following your procedure to check on you and address any questions or concerns that you may have regarding the information given to you following your procedure. If we do not reach you, we will leave a message.  However, if you are feeling well and you are not experiencing any problems, there is no need to return our call.  We will assume that you have returned to your regular daily activities without incident.  If any biopsies were taken you will be contacted by phone or by letter within the next 1-3 weeks.  Please call us at [phone] if you have not heard about the biopsies in 3 weeks.    SIGNATURES/CONFIDENTIALITY: You and/or your care partner have signed paperwork which will be entered into your electronic medical record.  These signatures attest to the fact that that the information above on your After Visit Summary has been reviewed and is understood.  Full responsibility of the confidentiality of this discharge information lies with you and/or your care-partner. 

## 2018-07-15 ENCOUNTER — Telehealth: Payer: Self-pay

## 2018-07-15 NOTE — Telephone Encounter (Signed)
  Follow up Call-  Call back number 07/14/2018  Post procedure Call Back phone  # [phone]  Permission to leave phone message No  Some recent data might be hidden     Patient questions:  Do you have a fever, pain , or abdominal swelling? No. Pain Score  0 *  Have you tolerated food without any problems? Yes.    Have you been able to return to your normal activities? Yes.    Do you have any questions about your discharge instructions: Diet   No. Medications  No. Follow up visit  No.  Do you have questions or concerns about your Care? No.  Actions: * If pain score is 4 or above: No action needed, pain <4.

## 2018-07-25 ENCOUNTER — Encounter: Payer: Self-pay | Admitting: Gastroenterology

## 2018-09-13 ENCOUNTER — Encounter: Payer: Self-pay | Admitting: Internal Medicine

## 2018-09-13 LAB — HM MAMMOGRAPHY

## 2018-10-05 NOTE — Patient Instructions (Addendum)
  Tests ordered today. Your results will be released to MyChart (or called to you) after review, usually within 72hours after test completion. If any changes need to be made, you will be notified at that same time.  Flu immunization administered today.    Medications reviewed and updated.  Changes include :   none    Please followup in 6 months   

## 2018-10-05 NOTE — Progress Notes (Signed)
Subjective:    Patient ID: Jane Montgomery, female    DOB: 1950/11/24, 68 y.o.   MRN: [id number]  HPI The patient is here for follow up.  Left ankle pain:  She used to have pain on the lateral aspect of her left ankle and stopped crossing her feet when she sat.  Since stopping that she has had pain in the anterior and medical aspect of the ankle that radiates into the arch.  She has had flat feet her whole life and has had issues with her feet.  She sees the foot doctor on Thursday.    Hypertension: She is taking her medication daily. She is compliant with a low sodium diet.  She denies chest pain, palpitations, and regular headaches. She is not exercising regularly.  She does not monitor her blood pressure at home.    Diabetes: She is taking her medication daily as prescribed. She has been less compliant with a diabetic diet. She is not exercising regularly. She checks her feet daily and denies foot lesions. She is up-to-date with an ophthalmology examination.   Hyperlipidemia: She is taking her medication daily. She is compliant with a low fat/cholesterol diet. She is not exercising regularly. She denies myalgias.   Depression: She is taking her medication daily as prescribed. She denies any side effects from the medication. She feels her depression is well controlled and she is happy with her current dose of medication.   Vitamin d def:  She is taking vitamin D daily as advised.    Medications and allergies reviewed with patient and updated if appropriate.  Patient Active Problem List   Diagnosis Date Noted  . Vitamin D deficiency 10/06/2018  . Osteopenia 04/15/2017  . Diabetes mellitus without complication ([place]) 03/88/8280  . Need for prophylactic antibiotic for dental work 03/06/2016  . 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 OSA (obstructive sleep apnea) 10/04/2015  . Allergic rhinitis 08/20/2014  . 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 Benign paroxysmal positional vertigo  08/19/2013  . Murmur 07/25/2011  . Hyperlipidemia 09/09/2007  . DISORDER, DYSMETABOLIC SYNDROME X 03/49/1791  . Depression 09/09/2007  . Essential hypertension 09/09/2007    Current Outpatient Medications on File Prior to Visit  Medication Sig Dispense Refill  . amLODipine (NORVASC) 5 MG tablet TAKE 1 TABLET(5 MG) BY MOUTH EVERY MORNING 90 tablet 1  . amoxicillin (AMOXIL) 500 MG tablet TK 4 TS PO 1 HOUR PRIOR TO APPOINTMENT  0  . aspirin 81 MG tablet Take 81 mg by mouth daily.      . Azelastine-Fluticasone (DYMISTA) 137-50 MCG/ACT SUSP Place into the nose.    . benazepril (LOTENSIN) 20 MG tablet TAKE 1 TABLET(20 MG) BY MOUTH DAILY 90 tablet 1  . Biotin 7500 MCG TABS Take 1 tablet by mouth daily.    . cholecalciferol (VITAMIN D) 1000 units tablet Take 5,000 Units by mouth daily.    . Glucos-Chond-Hyal Ac-Ca Fructo (MOVE FREE JOINT HEALTH ADVANCE PO) Take by mouth daily.    Marland Kitchen ibuprofen (ADVIL,MOTRIN) 200 MG tablet Take 200 mg by mouth every 6 (six) hours as needed.    . loratadine-pseudoephedrine (CLARITIN-D 24-HOUR) 10-240 MG 24 hr tablet Take 1 tablet by mouth daily.    . montelukast (SINGULAIR) 10 MG tablet Take 10 mg by mouth at bedtime.    . Multiple Vitamins-Minerals (ICAPS PO) Take 1 tablet by mouth 2 (two) times daily.     . pravastatin (PRAVACHOL) 40 MG tablet TAKE 1 TABLET(40 MG) BY  MOUTH AT BEDTIME 90 tablet 1  . sertraline (ZOLOFT) 100 MG tablet TAKE 1 TABLET(100 MG) BY MOUTH DAILY 90 tablet 1   No current facility-administered medications on file prior to visit.     Past Medical History:  Diagnosis Date  . Allergy   . 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 Arthritis   . Benign positional vertigo    in LLDP  . Cataract    removed bilaterally   . Chronic back pain    reason unknown   . Depression    takes Zoloft daily- hormone imbalance - not truly depression per pt   . Diarrhea   . Diverticulosis   . Gestational diabetes 1982, 1984   "pre"  . Gilbert  syndrome   . Heart murmur   . Hyperlipidemia    takes Pravastatin daily  . Hypertension    takes Amlodipine daily as well as Benazepril  . Joint pain   . Macular degeneration    early stages and dry  . Nocturia   . Osteoporosis   . Shortness of breath dyspnea    allergy related and occasionally will take Claritin.  . Sleep apnea    has cpap  . Tremor, essential    Dr Erling Cruz    Past Surgical History:  Procedure Laterality Date  . CATARACT EXTRACTION Bilateral 2015  . CESAREAN SECTION     x 2  . COLONOSCOPY  [phone]   Negative, redundant colon; [facility]  . DILATION AND CURETTAGE OF UTERUS     Dr Irven [place]  . PLANTAR FASCIA SURGERY Bilateral   . TONSILLECTOMY    . TOTAL SHOULDER ARTHROPLASTY Right 10/13/2015   Procedure: TOTAL SHOULDER ARTHROPLASTY;  Surgeon: [name], MD;  Location: [facility];  Service: Orthopedics;  Laterality: Right;  Right total shoulder arthroplasty    Social History   Socioeconomic History  . Marital status: Married    Spouse name: Not on file  . Number of children: 2  . Years of education: Not on file  . Highest education level: Not on file  Occupational History  . Not on file  Social Needs  . Financial resource strain: Not hard at all  . Food insecurity:    Worry: Never true    Inability: Never true  . Transportation needs:    Medical: No    Non-medical: No  Tobacco Use  . Smoking status: Never Smoker  . Smokeless tobacco: Never Used  Substance and Sexual Activity  . Alcohol use: No  . Drug use: No  . Sexual activity: Yes    Birth control/protection: Post-menopausal  Lifestyle  . Physical activity:    Days per week: 3 days    Minutes per session: 50 min  . Stress: Not at all  Relationships  . Social connections:    Talks on phone: More than three times a week    Gets together: More than three times a week    Attends religious service: More than 4 times per year    Active member of club or organization: Yes     Attends meetings of clubs or organizations: More than 4 times per year    Relationship status: Married  Other Topics Concern  . Not on file  Social History Narrative  . Not on file    Family History  Problem Relation Age of Onset  . Hypertension Father   . Heart attack Father 43       CABG   .  Colon cancer Mother 54  . Macular degeneration Mother   . Hypertension Mother   . Seizures Mother        ? etiology (preceded CVA)  . Stroke Mother 108  . Osteoporosis Mother   . Myelodysplastic syndrome Mother   . Colon cancer Maternal Grandmother   . Colon cancer Maternal Aunt   . Colon cancer Unknown        2 Maternal Great Aunts  . Heart attack Paternal Grandfather        > 55  . Heart attack Paternal Aunt 50  . Leukemia Paternal Grandmother   . Diabetes Neg Hx   . Colon polyps Neg Hx   . Esophageal cancer Neg Hx   . Rectal cancer Neg Hx   . Stomach cancer Neg Hx     Review of Systems  Constitutional: Negative for chills and fever.  Respiratory: Positive for shortness of breath (occasinally with exertion). Negative for cough and wheezing.   Cardiovascular: Positive for leg swelling (left ankle only). Negative for chest pain and palpitations.  Neurological: Negative for dizziness, light-headedness and headaches.       Objective:   Vitals:   10/06/18 0801  BP: (!) 162/88  Pulse: 72  Resp: 18  Temp: 97.8 F (36.6 C)  SpO2: 96%   BP Readings from Last 3 Encounters:  10/06/18 (!) 162/88  07/14/18 117/72  04/10/18 134/88   Wt Readings from Last 3 Encounters:  10/06/18 265 lb 12.8 oz (120.6 kg)  07/14/18 261 lb (118.4 kg)  06/30/18 261 lb (118.4 kg)   Body mass index is 47.99 kg/m.   Physical Exam    Constitutional: Appears well-developed and well-nourished. No distress.  HENT:  Head: Normocephalic and atraumatic.  Neck: Neck supple. No tracheal deviation present. No thyromegaly present.  No cervical lymphadenopathy Cardiovascular: Normal rate, regular  rhythm and normal heart sounds.   2/6 systolic murmur heard. No carotid bruit .  Trace left medial ankle edema Pulmonary/Chest: Effort normal and breath sounds normal. No respiratory distress. No has no wheezes. No rales.  Skin: Skin is warm and dry. Not diaphoretic.  Psychiatric: Normal mood and affect. Behavior is normal.    Diabetic Foot Exam - Simple   Simple Foot Form Diabetic Foot exam was performed with the following findings:  Yes 10/06/2018  8:27 AM  Visual Inspection No deformities, no ulcerations, no other skin breakdown bilaterally:  Yes Sensation Testing Intact to touch and monofilament testing bilaterally:  Yes Pulse Check Posterior Tibialis and Dorsalis pulse intact bilaterally:  Yes Comments Bunion b/l  - mild, mild dry skin w/o breaks in the skin      Assessment & Plan:    See Problem List for Assessment and Plan of chronic medical problems.

## 2018-10-06 ENCOUNTER — Encounter: Payer: Self-pay | Admitting: Internal Medicine

## 2018-10-06 ENCOUNTER — Other Ambulatory Visit (INDEPENDENT_AMBULATORY_CARE_PROVIDER_SITE_OTHER): Payer: Medicare Other

## 2018-10-06 ENCOUNTER — Ambulatory Visit (INDEPENDENT_AMBULATORY_CARE_PROVIDER_SITE_OTHER): Payer: Medicare Other | Admitting: Internal Medicine

## 2018-10-06 VITALS — BP 162/88 | HR 72 | Temp 97.8°F | Resp 18 | Ht 62.4 in | Wt 265.8 lb

## 2018-10-06 DIAGNOSIS — E559 Vitamin D deficiency, unspecified: Secondary | ICD-10-CM | POA: Diagnosis not present

## 2018-10-06 DIAGNOSIS — E119 Type 2 diabetes mellitus without complications: Secondary | ICD-10-CM | POA: Diagnosis not present

## 2018-10-06 DIAGNOSIS — E7849 Other hyperlipidemia: Secondary | ICD-10-CM

## 2018-10-06 DIAGNOSIS — I10 Essential (primary) hypertension: Secondary | ICD-10-CM

## 2018-10-06 DIAGNOSIS — Z23 Encounter for immunization: Secondary | ICD-10-CM

## 2018-10-06 DIAGNOSIS — F3289 Other specified depressive episodes: Secondary | ICD-10-CM | POA: Diagnosis not present

## 2018-10-06 LAB — COMPREHENSIVE METABOLIC PANEL
ALK PHOS: 104 U/L (ref 39–117)
ALT: 20 U/L (ref 0–35)
AST: 16 U/L (ref 0–37)
Albumin: 4.3 g/dL (ref 3.5–5.2)
BILIRUBIN TOTAL: 0.8 mg/dL (ref 0.2–1.2)
BUN: 17 mg/dL (ref 6–23)
CALCIUM: 9.3 mg/dL (ref 8.4–10.5)
CO2: 29 mEq/L (ref 19–32)
Chloride: 100 mEq/L (ref 96–112)
Creatinine, Ser: 0.5 mg/dL (ref 0.40–1.20)
GFR: 130.94 mL/min (ref >60.00)
Glucose, Bld: 124 mg/dL — ABNORMAL HIGH (ref 70–99)
POTASSIUM: 4.6 meq/L (ref 3.5–5.1)
Sodium: 139 mEq/L (ref 135–145)
Total Protein: 7.2 g/dL (ref 6.0–8.3)

## 2018-10-06 LAB — LIPID PANEL
CHOL/HDL RATIO: 3
CHOLESTEROL: 180 mg/dL (ref 0–200)
HDL: 60.3 mg/dL (ref >39.00)
LDL CALC: 90 mg/dL (ref 0–99)
NonHDL: 119.99
TRIGLYCERIDES: 150 mg/dL — AB (ref 0.0–149.0)
VLDL: 30 mg/dL (ref 0.0–40.0)

## 2018-10-06 LAB — HEMOGLOBIN A1C: Hgb A1c MFr Bld: 6.2 % (ref 4.6–6.5)

## 2018-10-06 LAB — VITAMIN D 25 HYDROXY (VIT D DEFICIENCY, FRACTURES): VITD: 26.2 ng/mL — ABNORMAL LOW (ref 30.00–100.00)

## 2018-10-06 NOTE — Assessment & Plan Note (Signed)
Controlled, stable Continue current dose of medication  

## 2018-10-06 NOTE — Assessment & Plan Note (Signed)
Check a1c Low sugar / carb diet Stressed regular exercise/acitivty, weight loss Has eye appt this week

## 2018-10-06 NOTE — Assessment & Plan Note (Signed)
BP elevated here - but well controlled previously - she thinks it was the effort to walk back to the exam room encouraged weight loss and more regular activity Continue current medications cmp

## 2018-10-06 NOTE — Assessment & Plan Note (Signed)
Taking vitamin d daily Check level 

## 2018-10-06 NOTE — Assessment & Plan Note (Signed)
Check lipid panel, cmp Continue daily statin Regular exercise and healthy diet encouraged  

## 2018-10-07 ENCOUNTER — Encounter: Payer: Self-pay | Admitting: Internal Medicine

## 2018-10-07 LAB — HM DIABETES EYE EXAM

## 2018-10-14 ENCOUNTER — Encounter: Payer: Self-pay | Admitting: Internal Medicine

## 2018-10-28 LAB — HM DIABETES EYE EXAM

## 2018-11-04 ENCOUNTER — Encounter: Payer: Self-pay | Admitting: Internal Medicine

## 2018-12-09 ENCOUNTER — Encounter: Payer: Self-pay | Admitting: Pulmonary Disease

## 2018-12-09 ENCOUNTER — Ambulatory Visit (INDEPENDENT_AMBULATORY_CARE_PROVIDER_SITE_OTHER): Payer: Medicare Other | Admitting: Pulmonary Disease

## 2018-12-09 VITALS — BP 118/84 | HR 74 | Ht 62.0 in | Wt 268.0 lb

## 2018-12-09 DIAGNOSIS — G4733 Obstructive sleep apnea (adult) (pediatric): Secondary | ICD-10-CM

## 2018-12-09 DIAGNOSIS — I10 Essential (primary) hypertension: Secondary | ICD-10-CM

## 2018-12-09 NOTE — Assessment & Plan Note (Signed)
She may have a benazepril induced cough-we discussed stopping and substituting with ARB if symptoms worsen

## 2018-12-09 NOTE — Assessment & Plan Note (Signed)
CPAP supplies will be renewed for a year  Weight loss encouraged, compliance with goal of at least 4-6 hrs every night is the expectation. Advised against medications with sedative side effects Cautioned against driving when sleepy - understanding that sleepiness will vary on a day to day basis  

## 2018-12-09 NOTE — Progress Notes (Signed)
   Subjective:    Patient ID: ARKIE TAGLIAFERRO, female    DOB: Feb 29, 1950, 68 y.o.   MRN: [id number]  HPI  Chief Complaint  Patient presents with  . Follow-up    pt states that she is doing well. States she has had some trouble breathing at night since the weather has changed.    She has been maintained on CPAP for more than a year.  She initially switched from nasal to full facemask but has now is back to a nasal mask.  No dryness but does have occasional nasal stuffiness. Around fall she developed increased allergy symptoms and is taking azelastine nasal spray in addition to Singulair.  She follows up with allergy, she also has 2 dogs at home and wonders if she needs to vacuum more.  CPAP download was reviewed which shows excellent control of events on 13 cm average on auto settings 10 to 15 cm, but occasional missed nights although average compliance is more than 4 hours per night.  No leak  Significant tests/ events reviewed  HST 05/2017 AHI 53/h   07/2017 CPAP 13 cm  Review of Systems neg for any significant sore throat, dysphagia, itching, sneezing, nasal congestion or excess/ purulent secretions, fever, chills, sweats, unintended wt loss, pleuritic or exertional cp, hempoptysis, orthopnea pnd or change in chronic leg swelling. Also denies presyncope, palpitations, heartburn, abdominal pain, nausea, vomiting, diarrhea or change in bowel or urinary habits, dysuria,hematuria, rash, arthralgias, visual complaints, headache, numbness weakness or ataxia.     Objective:   Physical Exam   Gen. Pleasant, obese, in no distress ENT - no lesions, no post nasal drip Neck: No JVD, no thyromegaly, no carotid bruits Lungs: no use of accessory muscles, no dullness to percussion, decreased without rales or rhonchi  Cardiovascular: Rhythm regular, heart sounds  normal, no murmurs or gallops, no peripheral edema Musculoskeletal: No deformities, no cyanosis or clubbing , no tremors          Assessment & Plan:

## 2018-12-09 NOTE — Patient Instructions (Signed)
CPAP supplies will be renewed for a year. Remember that benazepril can cause a mild upper airway cough

## 2018-12-17 ENCOUNTER — Other Ambulatory Visit: Payer: Self-pay | Admitting: Internal Medicine

## 2019-04-13 ENCOUNTER — Encounter: Payer: Medicare Other | Admitting: Internal Medicine

## 2019-04-15 ENCOUNTER — Ambulatory Visit: Payer: Medicare Other

## 2019-06-03 ENCOUNTER — Encounter: Payer: Self-pay | Admitting: Internal Medicine

## 2019-06-03 NOTE — Progress Notes (Addendum)
Subjective:   Jane Montgomery is a 69 y.o. female who presents for Medicare Annual (Subsequent) preventive examination.  Review of Systems:  No ROS.   Cardiac Risk Factors include: advanced age (>61men, >77 women);diabetes mellitus;dyslipidemia;hypertension Sleep patterns: has frequent nighttime awakenings, gets up 0-2 times nightly to void and sleeps 5-6 hours nightly. Has sleep apnea not well relieved with C-PAP. Dr. Elsworth Soho prescribes for the C-PAP and patient will continue to follow-up with pulmonary.  Home Safety/Smoke Alarms: Feels safe in home. Smoke alarms in place.  Living environment; residence and Firearm Safety: 1-story house/ trailer, equipment: Radio producer, Type: [place]. Lives with husband, no needs for DME, good support system Seat Belt Safety/Bike Helmet: Wears seat belt.     Objective:     Vitals: There were no vitals taken for this visit.  There is no height or weight on file to calculate BMI.  Advanced Directives 06/04/2019 07/14/2018 04/10/2018 10/14/2015 10/05/2015 10/04/2015 09/14/2015  Does Patient Have a Medical Advance Directive? No No No No No No No  Does patient want to make changes to medical advance directive? - - Yes (ED - Information included in AVS) - - - -  Would patient like information on creating a medical advance directive? No - Patient declined No - Patient declined - Yes - Educational materials given No - patient declined information No - patient declined information -    Tobacco Social History   Tobacco Use  Smoking Status Never Smoker  Smokeless Tobacco Never Used     Counseling given: Not Answered  Past Medical History:  Diagnosis Date  . Allergy   . 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 Arthritis   . Benign positional vertigo    in LLDP  . Cataract    removed bilaterally   . Chronic back pain    reason unknown   . Depression    takes Zoloft daily- hormone imbalance - not truly depression per pt   . Diarrhea   .  Diverticulosis   . Gestational diabetes 1982, 1984   "pre"  . Gilbert syndrome   . Heart murmur   . Hyperlipidemia    takes Pravastatin daily  . Hypertension    takes Amlodipine daily as well as Benazepril  . Joint pain   . Macular degeneration    early stages and dry  . Nocturia   . Osteoporosis   . Shortness of breath dyspnea    allergy related and occasionally will take Claritin.  . Sleep apnea    has cpap  . Tremor, essential    Dr Erling Cruz   Past Surgical History:  Procedure Laterality Date  . CATARACT EXTRACTION Bilateral 2015  . CESAREAN SECTION     x 2  . COLONOSCOPY  [phone]   Negative, redundant colon; [facility]  . DILATION AND CURETTAGE OF UTERUS     Dr Irven [place]  . PLANTAR FASCIA SURGERY Bilateral   . TONSILLECTOMY    . TOTAL SHOULDER ARTHROPLASTY Right 10/13/2015   Procedure: TOTAL SHOULDER ARTHROPLASTY;  Surgeon: [name], MD;  Location: [facility];  Service: Orthopedics;  Laterality: Right;  Right total shoulder arthroplasty   Family History  Problem Relation Age of Onset  . Hypertension Father   . Heart attack Father 48       CABG   . Colon cancer Mother 51  . Macular degeneration Mother   . Hypertension Mother   . Seizures Mother        ?  etiology (preceded CVA)  . Stroke Mother 10  . Osteoporosis Mother   . Myelodysplastic syndrome Mother   . Colon cancer Maternal Grandmother   . Colon cancer Maternal Aunt   . Colon cancer Other        2 Maternal Great Aunts  . Heart attack Paternal Grandfather        > 55  . Heart attack Paternal Aunt 70  . Leukemia Paternal Grandmother   . Diabetes Neg Hx   . Colon polyps Neg Hx   . Esophageal cancer Neg Hx   . Rectal cancer Neg Hx   . Stomach cancer Neg Hx    Social History   Socioeconomic History  . Marital status: Married    Spouse name: Not on file  . Number of children: 2  . Years of education: Not on file  . Highest education level: Not on file  Occupational History  . Not on file   Social Needs  . Financial resource strain: Not hard at all  . Food insecurity:    Worry: Never true    Inability: Never true  . Transportation needs:    Medical: No    Non-medical: No  Tobacco Use  . Smoking status: Never Smoker  . Smokeless tobacco: Never Used  Substance and Sexual Activity  . Alcohol use: No  . Drug use: No  . Sexual activity: Yes    Birth control/protection: Post-menopausal  Lifestyle  . Physical activity:    Days per week: 0 days    Minutes per session: Not on file  . Stress: Not at all  Relationships  . Social connections:    Talks on phone: More than three times a week    Gets together: More than three times a week    Attends religious service: More than 4 times per year    Active member of club or organization: Yes    Attends meetings of clubs or organizations: More than 4 times per year    Relationship status: Married  Other Topics Concern  . Not on file  Social History Narrative  . Not on file    Outpatient Encounter Medications as of 06/04/2019  Medication Sig  . amLODipine (NORVASC) 5 MG tablet TAKE 1 TABLET(5 MG) BY MOUTH EVERY MORNING  . amoxicillin (AMOXIL) 500 MG tablet TK 4 TS PO 1 HOUR PRIOR TO APPOINTMENT  . aspirin 81 MG tablet Take 81 mg by mouth daily.    . Azelastine-Fluticasone (DYMISTA) 137-50 MCG/ACT SUSP Place into the nose.  . benazepril (LOTENSIN) 20 MG tablet TAKE 1 TABLET(20 MG) BY MOUTH DAILY  . Biotin 7500 MCG TABS Take 1 tablet by mouth daily.  . cholecalciferol (VITAMIN D) 1000 units tablet Take 5,000 Units by mouth daily.  Marland Kitchen ibuprofen (ADVIL,MOTRIN) 200 MG tablet Take 200 mg by mouth every 6 (six) hours as needed.  Marland Kitchen levocetirizine (XYZAL) 5 MG tablet Take 1 tablet by mouth daily.  . montelukast (SINGULAIR) 10 MG tablet Take 10 mg by mouth at bedtime.  . Multiple Vitamins-Minerals (ICAPS PO) Take 1 tablet by mouth 2 (two) times daily.   . pravastatin (PRAVACHOL) 40 MG tablet TAKE 1 TABLET(40 MG) BY MOUTH AT BEDTIME  .  sertraline (ZOLOFT) 100 MG tablet TAKE 1 TABLET(100 MG) BY MOUTH DAILY  . [DISCONTINUED] Glucos-Chond-Hyal Ac-Ca Fructo (MOVE FREE JOINT HEALTH ADVANCE PO) Take by mouth daily.   No facility-administered encounter medications on file as of 06/04/2019.     Activities of Daily Living In  your present state of health, do you have any difficulty performing the following activities: 06/04/2019  Hearing? N  Vision? N  Difficulty concentrating or making decisions? N  Walking or climbing stairs? Y  Dressing or bathing? N  Doing errands, shopping? N  Preparing Food and eating ? N  Using the Toilet? N  In the past six months, have you accidently leaked urine? N  Do you have problems with loss of bowel control? N  Managing your Medications? N  Managing your Finances? N  Housekeeping or managing your Housekeeping? N  Some recent data might be hidden    Patient Care Team: Binnie Rail, MD as PCP - General (Internal Medicine)    Assessment:   This is a routine wellness examination for Skyy. Physical assessment deferred to PCP.  Exercise Activities and Dietary recommendations Current Exercise Habits: Home exercise routine, Time (Minutes): 20, Frequency (Times/Week): 3, Weekly Exercise (Minutes/Week): 60, Intensity: Mild, Exercise limited by: orthopedic condition(s)  Diet (meal preparation, eat out, water intake, caffeinated beverages, dairy products, fruits and vegetables): in general, a "healthy" diet     Reviewed heart healthy and diabetic diet. Encouraged patient to increase daily water and healthy fluid intake.    Goals    . Patient Stated     Increase my physical activity I will exercise on a routine basis 3 times weekly. I will do chair exercise while watching TV. Continue to do portion control, cut down on soda, and drink water.       Fall Risk Fall Risk  06/04/2019 06/04/2019 04/10/2018 10/03/2017 04/03/2017  Falls in the past year? 0 0 No No No  Number falls in past yr: 0 0 - - -   Injury with Fall? - 0 - - -  Risk for fall due to : Impaired balance/gait;Impaired mobility - Impaired balance/gait;Impaired mobility - -  Follow up Falls prevention discussed - - - -    Depression Screen PHQ 2/9 Scores 06/04/2019 06/04/2019 04/10/2018 10/03/2017  PHQ - 2 Score 0 0 2 0  PHQ- 9 Score 4 - 5 -     Cognitive Function       Ad8 score reviewed for issues:  Issues making decisions: no  Less interest in hobbies / activities: no  Repeats questions, stories (family complaining): no  Trouble using ordinary gadgets (microwave, computer, phone):no  Forgets the month or year: no  Mismanaging finances: no  Remembering appts: no  Daily problems with thinking and/or memory: no Ad8 score is= 0  Immunization History  Administered Date(s) Administered  . Influenza Split 01/23/2012, 09/08/2014  . Influenza Whole 10/02/2008  . Influenza, High Dose Seasonal PF 09/26/2017, 10/06/2018  . Influenza-Unspecified 10/14/2013, 10/01/2015, 09/20/2016, 09/26/2017  . Pneumococcal Conjugate-13 09/17/2012  . Pneumococcal Polysaccharide-23 04/03/2017  . Tdap 08/13/2012  . Zoster 10/14/2013   Screening Tests Health Maintenance  Topic Date Due  . HEMOGLOBIN A1C  04/07/2019  . INFLUENZA VACCINE  08/01/2019  . FOOT EXAM  10/07/2019  . OPHTHALMOLOGY EXAM  10/29/2019  . DEXA SCAN  04/11/2020  . MAMMOGRAM  09/13/2020  . TETANUS/TDAP  08/13/2022  . COLONOSCOPY  07/15/2023  . Hepatitis C Screening  Completed  . PNA vac Low Risk Adult  Completed      Plan:  Reviewed health maintenance screenings with patient today and relevant education, vaccines, and/or referrals were provided.   Continue to eat heart healthy diet (full of fruits, vegetables, whole grains, lean protein, water--limit salt, fat, and sugar intake) and increase physical  activity as tolerated.  Continue doing brain stimulating activities (puzzles, reading, adult coloring books, staying active) to keep memory sharp.   I  have personally reviewed and noted the following in the patient's chart:   . Medical and social history . Use of alcohol, tobacco or illicit drugs  . Current medications and supplements . Functional ability and status . Nutritional status . Physical activity . Advanced directives . List of other physicians . Screenings to include cognitive, depression, and falls . Referrals and appointments  In addition, I have reviewed and discussed with patient certain preventive protocols, quality metrics, and best practice recommendations. A written personalized care plan for preventive services as well as general preventive health recommendations were provided to patient.     Michiel Cowboy, RN  06/04/2019    Medical screening examination/treatment/procedure(s) were performed by non-physician practitioner and as supervising physician I was immediately available for consultation/collaboration. I agree with above. Binnie Rail, MD   Medical screening examination/treatment/procedure(s) were performed by non-physician practitioner and as supervising physician I was immediately available for consultation/collaboration. I agree with above. Binnie Rail, MD

## 2019-06-03 NOTE — Progress Notes (Signed)
Subjective:    Patient ID: Jane Montgomery, female    DOB: 04-13-1950, 69 y.o.   MRN: [id number]  HPI She is here for a physical exam.   She has gained weight since she was here last - her husband has been bringing more sweets home from the grocery store since the pandemic started and she has been overindulging.  She is not able to exercise due to her arthritis pain.  She knows she needs to cut back and get back to eating more healthy.    She has no concerns.    Medications and allergies reviewed with patient and updated if appropriate.  Patient Active Problem List   Diagnosis Date Noted  . Vitamin D deficiency 10/06/2018  . Osteopenia 04/15/2017  . Diabetes mellitus without complication ([facility]) 51/76/1607  . Need for prophylactic antibiotic for dental work 03/06/2016  . 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 OSA (obstructive sleep apnea) 10/04/2015  . Allergic rhinitis 08/20/2014  . 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 Benign paroxysmal positional vertigo 08/19/2013  . Murmur 07/25/2011  . Hyperlipidemia 09/09/2007  . DISORDER, DYSMETABOLIC SYNDROME X 37/09/6268  . Depression 09/09/2007  . Essential hypertension 09/09/2007    Current Outpatient Medications on File Prior to Visit  Medication Sig Dispense Refill  . amLODipine (NORVASC) 5 MG tablet TAKE 1 TABLET(5 MG) BY MOUTH EVERY MORNING 90 tablet 1  . amoxicillin (AMOXIL) 500 MG tablet TK 4 TS PO 1 HOUR PRIOR TO APPOINTMENT  0  . aspirin 81 MG tablet Take 81 mg by mouth daily.      . Azelastine-Fluticasone (DYMISTA) 137-50 MCG/ACT SUSP Place into the nose.    . benazepril (LOTENSIN) 20 MG tablet TAKE 1 TABLET(20 MG) BY MOUTH DAILY 90 tablet 1  . Biotin 7500 MCG TABS Take 1 tablet by mouth daily.    . cholecalciferol (VITAMIN D) 1000 units tablet Take 5,000 Units by mouth daily.    . Coenzyme Q10 100 MG TABS Take 1 tablet by mouth daily.    Marland Kitchen ibuprofen (ADVIL,MOTRIN) 200 MG tablet Take 200 mg by mouth every 6 (six) hours as  needed.    Marland Kitchen levocetirizine (XYZAL) 5 MG tablet Take 1 tablet by mouth daily.    . montelukast (SINGULAIR) 10 MG tablet Take 10 mg by mouth at bedtime.    . Multiple Vitamins-Minerals (ICAPS PO) Take 1 tablet by mouth 2 (two) times daily.     . pravastatin (PRAVACHOL) 40 MG tablet TAKE 1 TABLET(40 MG) BY MOUTH AT BEDTIME 90 tablet 1  . sertraline (ZOLOFT) 100 MG tablet TAKE 1 TABLET(100 MG) BY MOUTH DAILY 90 tablet 1   No current facility-administered medications on file prior to visit.     Past Medical History:  Diagnosis Date  . Allergy   . 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 Arthritis   . Benign positional vertigo    in LLDP  . Cataract    removed bilaterally   . Chronic back pain    reason unknown   . Depression    takes Zoloft daily- hormone imbalance - not truly depression per pt   . Diarrhea   . Diverticulosis   . Gestational diabetes 1982, 1984   "pre"  . Gilbert syndrome   . Heart murmur   . Hyperlipidemia    takes Pravastatin daily  . Hypertension    takes Amlodipine daily as well as Benazepril  . Joint pain   . Macular degeneration  early stages and dry  . Nocturia   . Osteoporosis   . Shortness of breath dyspnea    allergy related and occasionally will take Claritin.  . Sleep apnea    has cpap  . Tremor, essential    Dr Erling Cruz    Past Surgical History:  Procedure Laterality Date  . CATARACT EXTRACTION Bilateral 2015  . CESAREAN SECTION     x 2  . COLONOSCOPY  [phone]   Negative, redundant colon; [facility]  . DILATION AND CURETTAGE OF UTERUS     Dr Irven [place]  . PLANTAR FASCIA SURGERY Bilateral   . TONSILLECTOMY    . TOTAL SHOULDER ARTHROPLASTY Right 10/13/2015   Procedure: TOTAL SHOULDER ARTHROPLASTY;  Surgeon: [name], MD;  Location: [facility];  Service: Orthopedics;  Laterality: Right;  Right total shoulder arthroplasty    Social History   Socioeconomic History  . Marital status: Married    Spouse name: Not on file  .  Number of children: 2  . Years of education: Not on file  . Highest education level: Not on file  Occupational History  . Not on file  Social Needs  . Financial resource strain: Not hard at all  . Food insecurity:    Worry: Never true    Inability: Never true  . Transportation needs:    Medical: No    Non-medical: No  Tobacco Use  . Smoking status: Never Smoker  . Smokeless tobacco: Never Used  Substance and Sexual Activity  . Alcohol use: No  . Drug use: No  . Sexual activity: Yes    Birth control/protection: Post-menopausal  Lifestyle  . Physical activity:    Days per week: 3 days    Minutes per session: 50 min  . Stress: Not at all  Relationships  . Social connections:    Talks on phone: More than three times a week    Gets together: More than three times a week    Attends religious service: More than 4 times per year    Active member of club or organization: Yes    Attends meetings of clubs or organizations: More than 4 times per year    Relationship status: Married  Other Topics Concern  . Not on file  Social History Narrative  . Not on file    Family History  Problem Relation Age of Onset  . Hypertension Father   . Heart attack Father 73       CABG   . Colon cancer Mother 16  . Macular degeneration Mother   . Hypertension Mother   . Seizures Mother        ? etiology (preceded CVA)  . Stroke Mother 77  . Osteoporosis Mother   . Myelodysplastic syndrome Mother   . Colon cancer Maternal Grandmother   . Colon cancer Maternal Aunt   . Colon cancer Other        2 Maternal Great Aunts  . Heart attack Paternal Grandfather        > 55  . Heart attack Paternal Aunt 35  . Leukemia Paternal Grandmother   . Diabetes Neg Hx   . Colon polyps Neg Hx   . Esophageal cancer Neg Hx   . Rectal cancer Neg Hx   . Stomach cancer Neg Hx     Review of Systems  Constitutional: Negative for chills and fever.  HENT: Positive for postnasal drip.   Eyes: Negative for  visual disturbance.  Respiratory: Negative for cough, shortness of  breath and wheezing.   Cardiovascular: Positive for leg swelling (left - from tendinitis). Negative for chest pain and palpitations.  Gastrointestinal: Negative for abdominal pain, blood in stool, constipation, diarrhea and nausea.       No gerd  Genitourinary: Negative for dysuria and hematuria.  Musculoskeletal: Positive for arthralgias (right knee, left ankle) and back pain (lower).  Skin: Negative for color change and rash.  Neurological: Negative for dizziness, light-headedness, numbness and headaches.  Psychiatric/Behavioral: Negative for dysphoric mood. The patient is not nervous/anxious.        Objective:   Vitals:   06/04/19 0807  BP: (!) 150/80  Pulse: 82  Temp: 98.2 F (36.8 C)  SpO2: 97%   Filed Weights   06/04/19 0807  Weight: 270 lb (122.5 kg)   Body mass index is 49.38 kg/m.  BP Readings from Last 3 Encounters:  06/04/19 (!) 150/80  12/09/18 118/84  10/06/18 (!) 162/88    Wt Readings from Last 3 Encounters:  06/04/19 270 lb (122.5 kg)  12/09/18 268 lb (121.6 kg)  10/06/18 265 lb 12.8 oz (120.6 kg)      Office Visit from 06/04/2019 in [place]  PHQ-2 Total Score  0       Physical Exam Constitutional: She appears well-developed and well-nourished. No distress.  HENT:  Head: Normocephalic and atraumatic.  Right Ear: External ear normal. Normal ear canal and TM Left Ear: External ear normal.  Normal ear canal and TM Mouth/Throat: Oropharynx is clear and moist.  Eyes: Conjunctivae and EOM are normal.  Neck: Neck supple. No tracheal deviation present. No thyromegaly present. No carotid bruit  Cardiovascular: Normal rate, regular rhythm and normal heart sounds.   2/6 sys  murmur heard.  No edema. Pulmonary/Chest: Effort normal and breath sounds normal. No respiratory distress. She has no wheezes. She has no rales.  Breast: deferred   Abdominal: Soft. Obese.  She exhibits no distension. There is no tenderness.  Lymphadenopathy: She has no cervical adenopathy.  Skin: Skin is warm and dry. She is not diaphoretic.  Psychiatric: She has a normal mood and affect. Her behavior is normal.        Assessment & Plan:   Physical exam: Screening blood work   ordered Immunizations  Discussed shingrix, others up to date Colonoscopy   Up to date  Mammogram   Up to date  Gyn   No longer seeing Dexa   Up to date  Eye exams   Up to date  Exercise    none Weight   Obese  - advised weight loss - she will work on diet Skin  No concerns Substance abuse   none  See Problem List for Assessment and Plan of chronic medical problems.   FU in 6 months

## 2019-06-03 NOTE — Patient Instructions (Addendum)
Work on [employer].   Tests ordered today. Your results will be released to [place] (or called to you) after review, usually within 72hours after test completion. If any changes need to be made, you will be notified at that same time.  All other Health Maintenance issues reviewed.   All recommended immunizations and age-appropriate screenings are up-to-date or discussed.  No immunizations administered today.   Medications reviewed and updated.  Changes include :   none    Please followup in 6 months   Health Maintenance, Female Adopting a healthy lifestyle and getting preventive care can go a long way to promote health and wellness. Talk with your health care provider about what schedule of regular examinations is right for you. This is a good chance for you to check in with your provider about disease prevention and staying healthy. In between checkups, there are plenty of things you can do on your own. Experts have done a lot of research about which lifestyle changes and preventive measures are most likely to keep you healthy. Ask your health care provider for more information. Weight and diet Eat a healthy diet  Be sure to include plenty of vegetables, fruits, low-fat dairy products, and lean protein.  Do not eat a lot of foods high in solid fats, added sugars, or salt.  Get regular exercise. This is one of the most important things you can do for your health. ? Most adults should exercise for at least 150 minutes each week. The exercise should increase your heart rate and make you sweat (moderate-intensity exercise). ? Most adults should also do strengthening exercises at least twice a week. This is in addition to the moderate-intensity exercise. Maintain a healthy weight  Body mass index (BMI) is a measurement that can be used to identify possible weight problems. It estimates body fat based on height and weight. Your health care provider can help determine your BMI and  help you achieve or maintain a healthy weight.  For females 47 years of age and older: ? A BMI below 18.5 is considered underweight. ? A BMI of 18.5 to 24.9 is normal. ? A BMI of 25 to 29.9 is considered overweight. ? A BMI of 30 and above is considered obese. Watch levels of cholesterol and blood lipids  You should start having your blood tested for lipids and cholesterol at 67 years of age, then have this test every 5 years.  You may need to have your cholesterol levels checked more often if: ? Your lipid or cholesterol levels are high. ? You are older than 67 years of age. ? You are at high risk for heart disease. Cancer screening Lung Cancer  Lung cancer screening is recommended for adults 54-66 years old who are at high risk for lung cancer because of a history of smoking.  A yearly low-dose CT scan of the lungs is recommended for people who: ? Currently smoke. ? Have quit within the past 15 years. ? Have at least a 30-pack-year history of smoking. A pack year is smoking an average of one pack of cigarettes a day for 1 year.  Yearly screening should continue until it has been 15 years since you quit.  Yearly screening should stop if you develop a health problem that would prevent you from having lung cancer treatment. Breast Cancer  Practice breast self-awareness. This means understanding how your breasts normally appear and feel.  It also means doing regular breast self-exams. Let your health care provider  know about any changes, no matter how small.  If you are in your 20s or 30s, you should have a clinical breast exam (CBE) by a health care provider every 1-3 years as part of a regular health exam.  If you are 59 or older, have a CBE every year. Also consider having a breast X-ray (mammogram) every year.  If you have a family history of breast cancer, talk to your health care provider about genetic screening.  If you are at high risk for breast cancer, talk to your  health care provider about having an MRI and a mammogram every year.  Breast cancer gene (BRCA) assessment is recommended for women who have family members with BRCA-related cancers. BRCA-related cancers include: ? Breast. ? Ovarian. ? Tubal. ? Peritoneal cancers.  Results of the assessment will determine the need for genetic counseling and BRCA1 and BRCA2 testing. Cervical Cancer Your health care provider may recommend that you be screened regularly for cancer of the pelvic organs (ovaries, uterus, and vagina). This screening involves a pelvic examination, including checking for microscopic changes to the surface of your cervix (Pap test). You may be encouraged to have this screening done every 3 years, beginning at age 24.  For women ages 14-65, health care providers may recommend pelvic exams and Pap testing every 3 years, or they may recommend the Pap and pelvic exam, combined with testing for human papilloma virus (HPV), every 5 years. Some types of HPV increase your risk of cervical cancer. Testing for HPV may also be done on women of any age with unclear Pap test results.  Other health care providers may not recommend any screening for nonpregnant women who are considered low risk for pelvic cancer and who do not have symptoms. Ask your health care provider if a screening pelvic exam is right for you.  If you have had past treatment for cervical cancer or a condition that could lead to cancer, you need Pap tests and screening for cancer for at least 20 years after your treatment. If Pap tests have been discontinued, your risk factors (such as having a new sexual partner) need to be reassessed to determine if screening should resume. Some women have medical problems that increase the chance of getting cervical cancer. In these cases, your health care provider may recommend more frequent screening and Pap tests. Colorectal Cancer  This type of cancer can be detected and often  prevented.  Routine colorectal cancer screening usually begins at 67 years of age and continues through 67 years of age.  Your health care provider may recommend screening at an earlier age if you have risk factors for colon cancer.  Your health care provider may also recommend using home test kits to check for hidden blood in the stool.  A small camera at the end of a tube can be used to examine your colon directly (sigmoidoscopy or colonoscopy). This is done to check for the earliest forms of colorectal cancer.  Routine screening usually begins at age 21.  Direct examination of the colon should be repeated every 5-10 years through 67 years of age. However, you may need to be screened more often if early forms of precancerous polyps or small growths are found. Skin Cancer  Check your skin from head to toe regularly.  Tell your health care provider about any new moles or changes in moles, especially if there is a change in a mole's shape or color.  Also tell your health care provider if  you have a mole that is larger than the size of a pencil eraser.  Always use sunscreen. Apply sunscreen liberally and repeatedly throughout the day.  Protect yourself by wearing long sleeves, pants, a wide-brimmed hat, and sunglasses whenever you are outside. Heart disease, diabetes, and high blood pressure  High blood pressure causes heart disease and increases the risk of stroke. High blood pressure is more likely to develop in: ? People who have blood pressure in the high end of the normal range (130-139/85-89 mm Hg). ? People who are overweight or obese. ? People who are African American.  If you are 33-72 years of age, have your blood pressure checked every 3-5 years. If you are 40 years of age or older, have your blood pressure checked every year. You should have your blood pressure measured twice-once when you are at a hospital or clinic, and once when you are not at a hospital or clinic. Record  the average of the two measurements. To check your blood pressure when you are not at a hospital or clinic, you can use: ? An automated blood pressure machine at a pharmacy. ? A home blood pressure monitor.  If you are between 84 years and 26 years old, ask your health care provider if you should take aspirin to prevent strokes.  Have regular diabetes screenings. This involves taking a blood sample to check your fasting blood sugar level. ? If you are at a normal weight and have a low risk for diabetes, have this test once every three years after 67 years of age. ? If you are overweight and have a high risk for diabetes, consider being tested at a younger age or more often. Preventing infection Hepatitis B  If you have a higher risk for hepatitis B, you should be screened for this virus. You are considered at high risk for hepatitis B if: ? You were born in a country where hepatitis B is common. Ask your health care provider which countries are considered high risk. ? Your parents were born in a high-risk country, and you have not been immunized against hepatitis B (hepatitis B vaccine). ? You have HIV or AIDS. ? You use needles to inject street drugs. ? You live with someone who has hepatitis B. ? You have had sex with someone who has hepatitis B. ? You get hemodialysis treatment. ? You take certain medicines for conditions, including cancer, organ transplantation, and autoimmune conditions. Hepatitis C  Blood testing is recommended for: ? Everyone born from [birth year] through 1963. ? Anyone with known risk factors for hepatitis C. Sexually transmitted infections (STIs)  You should be screened for sexually transmitted infections (STIs) including gonorrhea and chlamydia if: ? You are sexually active and are younger than 67 years of age. ? You are older than 67 years of age and your health care provider tells you that you are at risk for this type of infection. ? Your sexual activity has  changed since you were last screened and you are at an increased risk for chlamydia or gonorrhea. Ask your health care provider if you are at risk.  If you do not have HIV, but are at risk, it may be recommended that you take a prescription medicine daily to prevent HIV infection. This is called pre-exposure prophylaxis (PrEP). You are considered at risk if: ? You are sexually active and do not regularly use condoms or know the HIV status of your partner(s). ? You take drugs by injection. ? You  are sexually active with a partner who has HIV. Talk with your health care provider about whether you are at high risk of being infected with HIV. If you choose to begin PrEP, you should first be tested for HIV. You should then be tested every 3 months for as long as you are taking PrEP. Pregnancy  If you are premenopausal and you may become pregnant, ask your health care provider about preconception counseling.  If you may become pregnant, take 400 to 800 micrograms (mcg) of folic acid every day.  If you want to prevent pregnancy, talk to your health care provider about birth control (contraception). Osteoporosis and menopause  Osteoporosis is a disease in which the bones lose minerals and strength with aging. This can result in serious bone fractures. Your risk for osteoporosis can be identified using a bone density scan.  If you are 40 years of age or older, or if you are at risk for osteoporosis and fractures, ask your health care provider if you should be screened.  Ask your health care provider whether you should take a calcium or vitamin D supplement to lower your risk for osteoporosis.  Menopause may have certain physical symptoms and risks.  Hormone replacement therapy may reduce some of these symptoms and risks. Talk to your health care provider about whether hormone replacement therapy is right for you. Follow these instructions at home:  Schedule regular health, dental, and eye  exams.  Stay current with your immunizations.  Do not use any tobacco products including cigarettes, chewing tobacco, or electronic cigarettes.  If you are pregnant, do not drink alcohol.  If you are breastfeeding, limit how much and how often you drink alcohol.  Limit alcohol intake to no more than 1 drink per day for nonpregnant women. One drink equals 12 ounces of beer, 5 ounces of wine, or 1 ounces of hard liquor.  Do not use street drugs.  Do not share needles.  Ask your health care provider for help if you need support or information about quitting drugs.  Tell your health care provider if you often feel depressed.  Tell your health care provider if you have ever been abused or do not feel safe at home. This information is not intended to replace advice given to you by your health care provider. Make sure you discuss any questions you have with your health care provider. Document Released: 07/02/2011 Document Revised: 05/24/2016 Document Reviewed: 09/20/2015 Elsevier Interactive Patient Education  2019 [employer].

## 2019-06-04 ENCOUNTER — Other Ambulatory Visit: Payer: Self-pay

## 2019-06-04 ENCOUNTER — Other Ambulatory Visit (INDEPENDENT_AMBULATORY_CARE_PROVIDER_SITE_OTHER): Payer: Medicare Other

## 2019-06-04 ENCOUNTER — Ambulatory Visit (INDEPENDENT_AMBULATORY_CARE_PROVIDER_SITE_OTHER): Payer: Medicare Other | Admitting: Internal Medicine

## 2019-06-04 ENCOUNTER — Ambulatory Visit (INDEPENDENT_AMBULATORY_CARE_PROVIDER_SITE_OTHER): Payer: Medicare Other | Admitting: *Deleted

## 2019-06-04 ENCOUNTER — Encounter: Payer: Self-pay | Admitting: Internal Medicine

## 2019-06-04 VITALS — BP 150/80 | HR 82 | Ht 62.0 in | Wt 270.0 lb

## 2019-06-04 VITALS — BP 150/80 | HR 82 | Temp 98.2°F | Ht 62.0 in | Wt 270.0 lb

## 2019-06-04 DIAGNOSIS — M85851 Other specified disorders of bone density and structure, right thigh: Secondary | ICD-10-CM | POA: Diagnosis not present

## 2019-06-04 DIAGNOSIS — E119 Type 2 diabetes mellitus without complications: Secondary | ICD-10-CM

## 2019-06-04 DIAGNOSIS — E7849 Other hyperlipidemia: Secondary | ICD-10-CM | POA: Diagnosis not present

## 2019-06-04 DIAGNOSIS — F3289 Other specified depressive episodes: Secondary | ICD-10-CM

## 2019-06-04 DIAGNOSIS — E559 Vitamin D deficiency, unspecified: Secondary | ICD-10-CM

## 2019-06-04 DIAGNOSIS — Z0000 Encounter for general adult medical examination without abnormal findings: Secondary | ICD-10-CM

## 2019-06-04 DIAGNOSIS — I10 Essential (primary) hypertension: Secondary | ICD-10-CM

## 2019-06-04 DIAGNOSIS — M85852 Other specified disorders of bone density and structure, left thigh: Secondary | ICD-10-CM

## 2019-06-04 DIAGNOSIS — R011 Cardiac murmur, unspecified: Secondary | ICD-10-CM

## 2019-06-04 LAB — CBC WITH DIFFERENTIAL/PLATELET
Basophils Absolute: 0 10*3/uL (ref 0.0–0.1)
Basophils Relative: 0.4 % (ref 0.0–3.0)
Eosinophils Absolute: 0.2 10*3/uL (ref 0.0–0.7)
Eosinophils Relative: 3.6 % (ref 0.0–5.0)
HCT: 41.8 % (ref 36.0–46.0)
Hemoglobin: 13.7 g/dL (ref 12.0–15.0)
Lymphocytes Relative: 22.9 % (ref 12.0–46.0)
Lymphs Abs: 1.2 10*3/uL (ref 0.7–4.0)
MCHC: 32.7 g/dL (ref 30.0–36.0)
MCV: 85.5 fl (ref 78.0–100.0)
Monocytes Absolute: 0.3 10*3/uL (ref 0.1–1.0)
Monocytes Relative: 5.5 % (ref 3.0–12.0)
Neutro Abs: 3.6 10*3/uL (ref 1.4–7.7)
Neutrophils Relative %: 67.6 % (ref 43.0–77.0)
Platelets: 183 10*3/uL (ref 150.0–400.0)
RBC: 4.89 Mil/uL (ref 3.87–5.11)
RDW: 16.3 % — ABNORMAL HIGH (ref 11.5–15.5)
WBC: 5.3 10*3/uL (ref 4.0–10.5)

## 2019-06-04 LAB — COMPREHENSIVE METABOLIC PANEL
ALT: 28 U/L (ref 0–35)
AST: 20 U/L (ref 0–37)
Albumin: 4.4 g/dL (ref 3.5–5.2)
Alkaline Phosphatase: 93 U/L (ref 39–117)
BUN: 21 mg/dL (ref 6–23)
CO2: 29 mEq/L (ref 19–32)
Calcium: 9.7 mg/dL (ref 8.4–10.5)
Chloride: 100 mEq/L (ref 96–112)
Creatinine, Ser: 0.53 mg/dL (ref 0.40–1.20)
GFR: 114.96 mL/min (ref >60.00)
Glucose, Bld: 113 mg/dL — ABNORMAL HIGH (ref 70–99)
Potassium: 4.6 mEq/L (ref 3.5–5.1)
Sodium: 141 mEq/L (ref 135–145)
Total Bilirubin: 1.1 mg/dL (ref 0.2–1.2)
Total Protein: 7.5 g/dL (ref 6.0–8.3)

## 2019-06-04 LAB — HEMOGLOBIN A1C: Hgb A1c MFr Bld: 6.3 % (ref 4.6–6.5)

## 2019-06-04 LAB — LIPID PANEL
Cholesterol: 201 mg/dL — ABNORMAL HIGH (ref 0–200)
HDL: 76.2 mg/dL (ref >39.00)
LDL Cholesterol: 95 mg/dL (ref 0–99)
NonHDL: 124.85
Total CHOL/HDL Ratio: 3
Triglycerides: 151 mg/dL — ABNORMAL HIGH (ref 0.0–149.0)
VLDL: 30.2 mg/dL (ref 0.0–40.0)

## 2019-06-04 LAB — VITAMIN D 25 HYDROXY (VIT D DEFICIENCY, FRACTURES): VITD: 36.64 ng/mL (ref 30.00–100.00)

## 2019-06-04 LAB — TSH: TSH: 1.92 u[IU]/mL (ref 0.35–4.50)

## 2019-06-04 NOTE — Assessment & Plan Note (Signed)
Has not been as compliant with a diabetic diet and has gained weight Not exercising due to joint pain Stressed improving diet to help avoid medication Encouraged increased activity Stressed weight loss a1c F/u in 6 months

## 2019-06-04 NOTE — Assessment & Plan Note (Signed)
Check lipid panel,cmp ,tsh Continue daily statin Regular exercise and healthy diet encouraged  

## 2019-06-04 NOTE — Assessment & Plan Note (Signed)
dexa up to date Taking vitamin d Not exercising

## 2019-06-04 NOTE — Patient Instructions (Addendum)
If you cannot attend class in person, you can still exercise at home. Video taped versions of [employer] classes are shown on [employer] (GTN) at 8 am and 1 pm Mondays through Fridays. You can also purchase a copy of the [facility] DVD by calling [place] (GTN) [employer]. GTN is available on [employer] channel 13 with a digital cable box and on [employer] channel 31. GTN is also available on [employer], channel 99. To view GTN, go to channel 99, press OK, select [place], then select GT[employer] to start the channel.   Continue doing brain stimulating activities (puzzles, reading, adult coloring books, staying active) to keep memory sharp.   Continue to eat heart healthy diet (full of fruits, vegetables, whole grains, lean protein, water--limit salt, fat, and sugar intake) and increase physical activity as tolerated.   Jane Montgomery , Thank you for taking time to come for your Medicare Wellness Visit. I appreciate your ongoing commitment to your health goals. Please review the following plan we discussed and let me know if I can assist you in the future.   These are the goals we discussed: Goals    . Patient Stated     Increase my physical activity I will exercise on a routine basis 3 times weekly. I will do chair exercise while watching TV. Continue to do portion control, cut down on soda, and drink water.       This is a list of the screening recommended for you and due dates:  Health Maintenance  Topic Date Due  . Hemoglobin A1C  04/07/2019  . Flu Shot  08/01/2019  . Complete foot exam   10/07/2019  . Eye exam for diabetics  10/29/2019  . DEXA scan (bone density measurement)  04/11/2020  . Mammogram  09/13/2020  . Tetanus Vaccine  08/13/2022  . Colon Cancer Screening  07/15/2023  .  Hepatitis C: One time screening is recommended by Center for Disease Control  (CDC) for  adults born from [birth year] through 1963.   Completed  . Pneumonia vaccines   Completed    Preventive Care 74 Years and Older, Female Preventive care refers to lifestyle choices and visits with your health care provider that can promote health and wellness. What does preventive care include?  A yearly physical exam. This is also called an annual well check.  Dental exams once or twice a year.  Routine eye exams. Ask your health care provider how often you should have your eyes checked.  Personal lifestyle choices, including: ? Daily care of your teeth and gums. ? Regular physical activity. ? Eating a healthy diet. ? Avoiding tobacco and drug use. ? Limiting alcohol use. ? Practicing safe sex. ? Taking low-dose aspirin every day. ? Taking vitamin and mineral supplements as recommended by your health care provider. What happens during an annual well check? The services and screenings done by your health care provider during your annual well check will depend on your age, overall health, lifestyle risk factors, and family history of disease. Counseling Your health care provider may ask you questions about your:  Alcohol use.  Tobacco use.  Drug use.  Emotional well-being.  Home and relationship well-being.  Sexual activity.  Eating habits.  History of falls.  Memory and ability to understand (cognition).  Work and work Statistician.  Reproductive health.  Screening You may have the following tests or measurements:  Height, weight, and BMI.  Blood pressure.  Lipid and cholesterol levels. These may  be checked every 5 years, or more frequently if you are over 67 years old.  Skin check.  Lung cancer screening. You may have this screening every year starting at age 67 if you have a 30-pack-year history of smoking and currently smoke or have quit within the past 15 years.  Colorectal cancer screening. All adults should have this screening starting at age 67 and continuing until age 53. You will have tests every 1-10 years, depending on your  results and the type of screening test. People at increased risk should start screening at an earlier age. Screening tests may include: ? Guaiac-based fecal occult blood testing. ? Fecal immunochemical test (FIT). ? Stool DNA test. ? Virtual colonoscopy. ? Sigmoidoscopy. During this test, a flexible tube with a tiny camera (sigmoidoscope) is used to examine your rectum and lower colon. The sigmoidoscope is inserted through your anus into your rectum and lower colon. ? Colonoscopy. During this test, a long, thin, flexible tube with a tiny camera (colonoscope) is used to examine your entire colon and rectum.  Hepatitis C blood test.  Hepatitis B blood test.  Sexually transmitted disease (STD) testing.  Diabetes screening. This is done by checking your blood sugar (glucose) after you have not eaten for a while (fasting). You may have this done every 1-3 years.  Bone density scan. This is done to screen for osteoporosis. You may have this done starting at age 67.  Mammogram. This may be done every 1-2 years. Talk to your health care provider about how often you should have regular mammograms. Talk with your health care provider about your test results, treatment options, and if necessary, the need for more tests. Vaccines Your health care provider may recommend certain vaccines, such as:  Influenza vaccine. This is recommended every year.  Tetanus, diphtheria, and acellular pertussis (Tdap, Td) vaccine. You may need a Td booster every 10 years.  Varicella vaccine. You may need this if you have not been vaccinated.  Zoster vaccine. You may need this after age 67.  Measles, mumps, and rubella (MMR) vaccine. You may need at least one dose of MMR if you were born in 1957 or later. You may also need a second dose.  Pneumococcal 13-valent conjugate (PCV13) vaccine. One dose is recommended after age 67.  Pneumococcal polysaccharide (PPSV23) vaccine. One dose is recommended after age  15.  Meningococcal vaccine. You may need this if you have certain conditions.  Hepatitis A vaccine. You may need this if you have certain conditions or if you travel or work in places where you may be exposed to hepatitis A.  Hepatitis B vaccine. You may need this if you have certain conditions or if you travel or work in places where you may be exposed to hepatitis B.  Haemophilus influenzae type b (Hib) vaccine. You may need this if you have certain conditions. Talk to your health care provider about which screenings and vaccines you need and how often you need them. This information is not intended to replace advice given to you by your health care provider. Make sure you discuss any questions you have with your health care provider. Document Released: 01/13/2016 Document Revised: 02/06/2018 Document Reviewed: 10/18/2015 Elsevier Interactive Patient Education  2019 [employer].

## 2019-06-04 NOTE — Assessment & Plan Note (Signed)
BP elevated here today, but was well controlled at last visit -- occasionally elevated, but usually good Will not change medication  Work on improving diet and weight loss Cmp, tsh, cbc

## 2019-06-04 NOTE — Assessment & Plan Note (Addendum)
Mild - echo 2012 - aortic valve calcification will follow clinically

## 2019-06-04 NOTE — Assessment & Plan Note (Addendum)
Controlled, stable Continue current dose of medication - sertraline 100 mg daily

## 2019-06-04 NOTE — Assessment & Plan Note (Signed)
Taking vitamin d Check level 

## 2019-06-06 ENCOUNTER — Encounter: Payer: Self-pay | Admitting: Internal Medicine

## 2019-06-11 ENCOUNTER — Other Ambulatory Visit: Payer: Self-pay | Admitting: Internal Medicine

## 2019-09-19 LAB — HM MAMMOGRAPHY

## 2019-09-23 ENCOUNTER — Encounter: Payer: Self-pay | Admitting: Internal Medicine

## 2019-12-03 NOTE — Progress Notes (Signed)
Subjective:    Patient ID: Jane Montgomery, female    DOB: 10/22/1950, 69 y.o.   MRN: [id number]  HPI The patient is here for follow up.  Her husband died in 2023/10/01 of this year - he had a cardiac arrest at home and she tried to revive him.  He died a few days later in the hospital.  Overall, she feels like she is doing ok.    She is not exercising regularly.     Hypertension: She is taking her medication daily. She is compliant with a low sodium diet.  She denies chest pain, palpitations, edema, shortness of breath and regular headaches.  Diabetes: She is taking her medication daily as prescribed. She has not been as compliant with a diabetic diet.  She denies numbness/tingling in her feet and foot lesions.   Hyperlipidemia: She is taking her medication daily. She is compliant with a low fat/cholesterol diet. She denies myalgias.   Depression: She is taking her medication daily as prescribed. She denies any side effects from the medication. She feels her depression is well controlled and she is happy with her current dose of medication.     Medications and allergies reviewed with patient and updated if appropriate.  Patient Active Problem List   Diagnosis Date Noted  . Systolic murmur 123456  . Vitamin D deficiency 10/06/2018  . Osteopenia 04/15/2017  . Diabetes mellitus without complication ([place]) 123456  . Need for prophylactic antibiotic for dental work 03/06/2016  . 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 OSA (obstructive sleep apnea) 10/04/2015  . Allergic rhinitis 08/20/2014  . 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 Benign paroxysmal positional vertigo 08/19/2013  . Hyperlipidemia 09/09/2007  . DISORDER, DYSMETABOLIC SYNDROME X [id number]  . Depression 09/09/2007  . Essential hypertension 09/09/2007    Current Outpatient Medications on File Prior to Visit  Medication Sig Dispense Refill  . amLODipine (NORVASC) 5 MG tablet TAKE 1 TABLET(5 MG) BY MOUTH EVERY  MORNING 90 tablet 1  . amoxicillin (AMOXIL) 500 MG tablet TK 4 TS PO 1 HOUR PRIOR TO APPOINTMENT  0  . aspirin 81 MG tablet Take 81 mg by mouth daily.      . Azelastine-Fluticasone (DYMISTA) 137-50 MCG/ACT SUSP Place into the nose.    . benazepril (LOTENSIN) 20 MG tablet TAKE 1 TABLET(20 MG) BY MOUTH DAILY 90 tablet 1  . Biotin 7500 MCG TABS Take 1 tablet by mouth daily.    . cholecalciferol (VITAMIN D) 1000 units tablet Take 5,000 Units by mouth daily.    . Coenzyme Q10 100 MG TABS Take 1 tablet by mouth daily.    Marland Kitchen levocetirizine (XYZAL) 5 MG tablet Take 1 tablet by mouth daily.    . montelukast (SINGULAIR) 10 MG tablet Take 10 mg by mouth at bedtime.    . Multiple Vitamins-Minerals (ICAPS PO) Take 1 tablet by mouth 2 (two) times daily.     . pravastatin (PRAVACHOL) 40 MG tablet TAKE 1 TABLET(40 MG) BY MOUTH AT BEDTIME 90 tablet 1  . sertraline (ZOLOFT) 100 MG tablet TAKE 1 TABLET(100 MG) BY MOUTH DAILY 90 tablet 1   No current facility-administered medications on file prior to visit.     Past Medical History:  Diagnosis Date  . Allergy   . 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 Arthritis   . Benign positional vertigo    in LLDP  . Cataract    removed bilaterally   . Chronic back  pain    reason unknown   . Depression    takes Zoloft daily- hormone imbalance - not truly depression per pt   . Diarrhea   . Diverticulosis   . Gestational diabetes 1982, 1984   "pre"  . Gilbert syndrome   . Heart murmur   . Hyperlipidemia    takes Pravastatin daily  . Hypertension    takes Amlodipine daily as well as Benazepril  . Joint pain   . Macular degeneration    early stages and dry  . Nocturia   . Osteoporosis   . Shortness of breath dyspnea    allergy related and occasionally will take Claritin.  . Sleep apnea    has cpap  . Tremor, essential    Dr Erling Cruz    Past Surgical History:  Procedure Laterality Date  . CATARACT EXTRACTION Bilateral 2015  . CESAREAN SECTION      x 2  . COLONOSCOPY  [phone]   Negative, redundant colon; [facility]  . DILATION AND CURETTAGE OF UTERUS     Dr Irven [place]  . PLANTAR FASCIA SURGERY Bilateral   . TONSILLECTOMY    . TOTAL SHOULDER ARTHROPLASTY Right 10/13/2015   Procedure: TOTAL SHOULDER ARTHROPLASTY;  Surgeon: [name], MD;  Location: [facility];  Service: Orthopedics;  Laterality: Right;  Right total shoulder arthroplasty    Social History   Socioeconomic History  . Marital status: Married    Spouse name: Not on file  . Number of children: 2  . Years of education: Not on file  . Highest education level: Not on file  Occupational History  . Not on file  Social Needs  . Financial resource strain: Not hard at all  . Food insecurity    Worry: Never true    Inability: Never true  . Transportation needs    Medical: No    Non-medical: No  Tobacco Use  . Smoking status: Never Smoker  . Smokeless tobacco: Never Used  Substance and Sexual Activity  . Alcohol use: No  . Drug use: No  . Sexual activity: Yes    Birth control/protection: Post-menopausal  Lifestyle  . Physical activity    Days per week: 0 days    Minutes per session: Not on file  . Stress: Not at all  Relationships  . Social connections    Talks on phone: More than three times a week    Gets together: More than three times a week    Attends religious service: More than 4 times per year    Active member of club or organization: Yes    Attends meetings of clubs or organizations: More than 4 times per year    Relationship status: Married  Other Topics Concern  . Not on file  Social History Narrative  . Not on file    Family History  Problem Relation Age of Onset  . Hypertension Father   . Heart attack Father 98       CABG   . Colon cancer Mother 63  . Macular degeneration Mother   . Hypertension Mother   . Seizures Mother        ? etiology (preceded CVA)  . Stroke Mother 18  . Osteoporosis Mother   . Myelodysplastic syndrome  Mother   . Colon cancer Maternal Grandmother   . Colon cancer Maternal Aunt   . Colon cancer Other        2 Maternal Great Aunts  . Heart attack Paternal Grandfather        >  25  . Heart attack Paternal Aunt 32  . Leukemia Paternal Grandmother   . Diabetes Neg Hx   . Colon polyps Neg Hx   . Esophageal cancer Neg Hx   . Rectal cancer Neg Hx   . Stomach cancer Neg Hx     Review of Systems  Constitutional: Negative for chills and fever.  Respiratory: Negative for cough, shortness of breath and wheezing.   Cardiovascular: Negative for chest pain, palpitations and leg swelling.  Neurological: Positive for numbness (right hand and left elbow). Negative for light-headedness and headaches.       Objective:   Vitals:   12/04/19 0830  BP: (!) 162/90  Pulse: 76  Resp: 18  Temp: 98.4 F (36.9 C)  SpO2: 98%   BP Readings from Last 3 Encounters:  12/04/19 (!) 162/90  06/04/19 (!) 150/80  06/04/19 (!) 150/80   Wt Readings from Last 3 Encounters:  12/04/19 261 lb 12.8 oz (118.8 kg)  06/04/19 270 lb (122.5 kg)  06/04/19 270 lb (122.5 kg)   Body mass index is 47.88 kg/m.   Physical Exam    Constitutional: Appears well-developed and well-nourished. No distress.  HENT:  Head: Normocephalic and atraumatic.  Neck: Neck supple. No tracheal deviation present. No thyromegaly present.  No cervical lymphadenopathy Cardiovascular: Normal rate, regular rhythm and normal heart sounds.   2/6 systolic murmur heard. No carotid bruit .  No edema Pulmonary/Chest: Effort normal and breath sounds normal. No respiratory distress. No has no wheezes. No rales.  Skin: Skin is warm and dry. Not diaphoretic.  Psychiatric: Normal mood and affect. Behavior is normal.      Assessment & Plan:    See Problem List for Assessment and Plan of chronic medical problems.    This visit occurred during the SARS-CoV-2 public health emergency.  Safety protocols were in place, including screening questions  prior to the visit, additional usage of staff PPE, and extensive cleaning of exam room while observing appropriate contact time as indicated for disinfecting solutions.   Fu in 6 mo

## 2019-12-03 NOTE — Patient Instructions (Addendum)
  Tests ordered today. Your results will be released to MyChart (or called to you) after review.  If any changes need to be made, you will be notified at that same time.   Medications reviewed and updated.  Changes include :   none  Your prescription(s) have been submitted to your pharmacy. Please take as directed and contact our office if you believe you are having problem(s) with the medication(s).    Please followup in 6 months   

## 2019-12-04 ENCOUNTER — Ambulatory Visit (INDEPENDENT_AMBULATORY_CARE_PROVIDER_SITE_OTHER): Payer: Medicare Other | Admitting: Internal Medicine

## 2019-12-04 ENCOUNTER — Other Ambulatory Visit (INDEPENDENT_AMBULATORY_CARE_PROVIDER_SITE_OTHER): Payer: Medicare Other

## 2019-12-04 ENCOUNTER — Encounter: Payer: Self-pay | Admitting: Internal Medicine

## 2019-12-04 ENCOUNTER — Other Ambulatory Visit: Payer: Self-pay

## 2019-12-04 VITALS — BP 148/90 | HR 76 | Temp 98.4°F | Resp 18 | Ht 62.0 in | Wt 261.8 lb

## 2019-12-04 DIAGNOSIS — I10 Essential (primary) hypertension: Secondary | ICD-10-CM

## 2019-12-04 DIAGNOSIS — E7849 Other hyperlipidemia: Secondary | ICD-10-CM

## 2019-12-04 DIAGNOSIS — E119 Type 2 diabetes mellitus without complications: Secondary | ICD-10-CM

## 2019-12-04 DIAGNOSIS — F3289 Other specified depressive episodes: Secondary | ICD-10-CM | POA: Diagnosis not present

## 2019-12-04 LAB — COMPREHENSIVE METABOLIC PANEL WITH GFR
ALT: 49 U/L — ABNORMAL HIGH (ref 0–35)
AST: 30 U/L (ref 0–37)
Albumin: 4.4 g/dL (ref 3.5–5.2)
Alkaline Phosphatase: 135 U/L — ABNORMAL HIGH (ref 39–117)
BUN: 22 mg/dL (ref 6–23)
CO2: 27 meq/L (ref 19–32)
Calcium: 9.7 mg/dL (ref 8.4–10.5)
Chloride: 100 meq/L (ref 96–112)
Creatinine, Ser: 0.53 mg/dL (ref 0.40–1.20)
GFR: 114.78 mL/min
Glucose, Bld: 112 mg/dL — ABNORMAL HIGH (ref 70–99)
Potassium: 4.1 meq/L (ref 3.5–5.1)
Sodium: 139 meq/L (ref 135–145)
Total Bilirubin: 0.8 mg/dL (ref 0.2–1.2)
Total Protein: 7.3 g/dL (ref 6.0–8.3)

## 2019-12-04 LAB — LIPID PANEL
Cholesterol: 198 mg/dL (ref 0–200)
HDL: 56.1 mg/dL (ref >39.00)
NonHDL: 141.51
Total CHOL/HDL Ratio: 4
Triglycerides: 246 mg/dL — ABNORMAL HIGH (ref 0.0–149.0)
VLDL: 49.2 mg/dL — ABNORMAL HIGH (ref 0.0–40.0)

## 2019-12-04 LAB — LDL CHOLESTEROL, DIRECT: Direct LDL: 105 mg/dL

## 2019-12-04 LAB — HEMOGLOBIN A1C: Hgb A1c MFr Bld: 6.4 % (ref 4.6–6.5)

## 2019-12-04 NOTE — Assessment & Plan Note (Signed)
Check a1c, cmp Low sugar / carb diet Encouraged regular exercise

## 2019-12-04 NOTE — Assessment & Plan Note (Addendum)
BP high here - likely related to some white coat htn and deconditioning  Better on repeat Encouraged to monitor at home Continue current medications cmp

## 2019-12-04 NOTE — Assessment & Plan Note (Signed)
Controlled, stable She feels like she is doing ok overall  Continue current dose of medication - sertraline 100 mg daily

## 2019-12-04 NOTE — Assessment & Plan Note (Signed)
Check lipid panel  Continue daily statin Regular exercise and healthy diet encouraged  

## 2019-12-05 ENCOUNTER — Encounter: Payer: Self-pay | Admitting: Internal Medicine

## 2019-12-08 ENCOUNTER — Other Ambulatory Visit: Payer: Self-pay | Admitting: Internal Medicine

## 2019-12-17 ENCOUNTER — Other Ambulatory Visit: Payer: Self-pay | Admitting: Internal Medicine

## 2019-12-17 MED ORDER — SERTRALINE HCL 100 MG PO TABS: ORAL_TABLET | ORAL | 1 refills | Status: DC

## 2019-12-17 NOTE — Telephone Encounter (Signed)
Copied from [place] [id number]. Topic: Quick Communication - Rx Refill/Question >> Dec 17, 2019 12:55 PM Mcneil, Ja-Kwan wrote: Medication: sertraline (ZOLOFT) 100 MG tablet  Has the patient contacted their pharmacy? yes   Preferred Pharmacy (with phone number or street name): [facility] [place], [place]  Phone: [phone]  Fax: [phone]  Agent: Please be advised that RX refills may take up to 3 business days. We ask that you follow-up with your pharmacy.

## 2020-06-02 NOTE — Progress Notes (Signed)
Subjective:    Patient ID: Jane Montgomery, female    DOB: [birth year], 70 y.o.   MRN: [id number]  HPI The patient is here for follow up of their chronic medical problems, including htn, DM, hyperlipidemia, depression.  She is taking all of her medications as prescribed.    She is not exercising regularly.     Her arthritis is worse.  She takes tylenol and advil on occasion.  She wonders if there is any other options for her to take.  She has seen orthopedics for her knee and they advised possible knee replacement, but she needs to lose weight and would rather avoid surgery.   Medications and allergies reviewed with patient and updated if appropriate.  Patient Active Problem List   Diagnosis Date Noted  . Systolic murmur 123456  . Vitamin D deficiency 10/06/2018  . Osteopenia 04/15/2017  . Diabetes mellitus without complication ([place]) 123456  . Need for prophylactic antibiotic for dental work 03/06/2016  . 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 OSA (obstructive sleep apnea) 10/04/2015  . Allergic rhinitis 08/20/2014  . 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 Benign paroxysmal positional vertigo 08/19/2013  . Hyperlipidemia 09/09/2007  . DISORDER, DYSMETABOLIC SYNDROME X Q000111Q  . Depression 09/09/2007  . Essential hypertension 09/09/2007    Current Outpatient Medications on File Prior to Visit  Medication Sig Dispense Refill  . amLODipine (NORVASC) 5 MG tablet TAKE 1 TABLET(5 MG) BY MOUTH EVERY MORNING 90 tablet 1  . amoxicillin (AMOXIL) 500 MG tablet TK 4 TS PO 1 HOUR PRIOR TO APPOINTMENT  0  . aspirin 81 MG tablet Take 81 mg by mouth daily.      . Azelastine-Fluticasone (DYMISTA) 137-50 MCG/ACT SUSP Place into the nose.    . benazepril (LOTENSIN) 20 MG tablet TAKE 1 TABLET(20 MG) BY MOUTH DAILY 90 tablet 1  . Biotin 7500 MCG TABS Take 1 tablet by mouth daily.    . cholecalciferol (VITAMIN D) 1000 units tablet Take 5,000 Units by mouth daily.    . Coenzyme Q10  100 MG TABS Take 1 tablet by mouth daily.    Marland Kitchen levocetirizine (XYZAL) 5 MG tablet Take 1 tablet by mouth daily.    . montelukast (SINGULAIR) 10 MG tablet Take 10 mg by mouth at bedtime.    . Multiple Vitamins-Minerals (ICAPS PO) Take 1 tablet by mouth 2 (two) times daily.     . pravastatin (PRAVACHOL) 40 MG tablet TAKE 1 TABLET(40 MG) BY MOUTH AT BEDTIME 90 tablet 1  . sertraline (ZOLOFT) 100 MG tablet TAKE 1 TABLET(100 MG) BY MOUTH DAILY 90 tablet 1   No current facility-administered medications on file prior to visit.    Past Medical History:  Diagnosis Date  . Allergy   . 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 Arthritis   . Benign positional vertigo    in LLDP  . Cataract    removed bilaterally   . Chronic back pain    reason unknown   . Depression    takes Zoloft daily- hormone imbalance - not truly depression per pt   . Diarrhea   . Diverticulosis   . Gestational diabetes 1982, 1984   "pre"  . Gilbert syndrome   . Heart murmur   . Hyperlipidemia    takes Pravastatin daily  . Hypertension    takes Amlodipine daily as well as Benazepril  . Joint pain   . Macular degeneration    early stages and  dry  . Nocturia   . Osteoporosis   . Shortness of breath dyspnea    allergy related and occasionally will take Claritin.  . Sleep apnea    has cpap  . Tremor, essential    Dr Erling Cruz    Past Surgical History:  Procedure Laterality Date  . CATARACT EXTRACTION Bilateral 2015  . CESAREAN SECTION     x 2  . COLONOSCOPY  [phone]   Negative, redundant colon; [facility]  . DILATION AND CURETTAGE OF UTERUS     Dr Irven [place]  . PLANTAR FASCIA SURGERY Bilateral   . TONSILLECTOMY    . TOTAL SHOULDER ARTHROPLASTY Right 10/13/2015   Procedure: TOTAL SHOULDER ARTHROPLASTY;  Surgeon: [name], MD;  Location: [place];  Service: Orthopedics;  Laterality: Right;  Right total shoulder arthroplasty    Social History   Socioeconomic History  . Marital status: Married      Spouse name: Not on file  . Number of children: 2  . Years of education: Not on file  . Highest education level: Not on file  Occupational History  . Not on file  Tobacco Use  . Smoking status: Never Smoker  . Smokeless tobacco: Never Used  Substance and Sexual Activity  . Alcohol use: No  . Drug use: No  . Sexual activity: Yes    Birth control/protection: Post-menopausal  Other Topics Concern  . Not on file  Social History Narrative  . Not on file   Social Determinants of Health   Financial Resource Strain:   . Difficulty of Paying Living Expenses:   Food Insecurity:   . Worried About Charity fundraiser in the Last Year:   . Arboriculturist in the Last Year:   Transportation Needs:   . Film/video editor (Medical):   Marland Kitchen Lack of Transportation (Non-Medical):   Physical Activity: Unknown  . Days of Exercise per Week: 0 days  . Minutes of Exercise per Session: Not asked  Stress:   . Feeling of Stress :   Social Connections:   . Frequency of Communication with Friends and Family:   . Frequency of Social Gatherings with Friends and Family:   . Attends Religious Services:   . Active Member of Clubs or Organizations:   . Attends Archivist Meetings:   Marland Kitchen Marital Status:     Family History  Problem Relation Age of Onset  . Hypertension Father   . Heart attack Father 41       CABG   . Colon cancer Mother 53  . Macular degeneration Mother   . Hypertension Mother   . Seizures Mother        ? etiology (preceded CVA)  . Stroke Mother 30  . Osteoporosis Mother   . Myelodysplastic syndrome Mother   . Colon cancer Maternal Grandmother   . Colon cancer Maternal Aunt   . Colon cancer Other        2 Maternal Great Aunts  . Heart attack Paternal Grandfather        > 55  . Heart attack Paternal Aunt 48  . Leukemia Paternal Grandmother   . Diabetes Neg Hx   . Colon polyps Neg Hx   . Esophageal cancer Neg Hx   . Rectal cancer Neg Hx   . Stomach cancer  Neg Hx     Review of Systems  Constitutional: Negative for chills and fever.  Respiratory: Negative for cough, shortness of breath and wheezing.   Cardiovascular:  Negative for chest pain, palpitations and leg swelling.  Neurological: Negative for dizziness, light-headedness and headaches.       Objective:   Vitals:   06/03/20 0906  BP: (!) 144/70  Pulse: 61  Resp: 16  Temp: 98.2 F (36.8 C)  SpO2: 95%   BP Readings from Last 3 Encounters:  06/03/20 (!) 144/70  12/04/19 (!) 148/90  06/04/19 (!) 150/80   Wt Readings from Last 3 Encounters:  06/03/20 255 lb (115.7 kg)  12/04/19 261 lb 12.8 oz (118.8 kg)  06/04/19 270 lb (122.5 kg)   Body mass index is 46.64 kg/m.   Physical Exam    Constitutional: Appears well-developed and well-nourished. No distress.  HENT:  Head: Normocephalic and atraumatic.  Neck: Neck supple. No tracheal deviation present. No thyromegaly present.  No cervical lymphadenopathy Cardiovascular: Normal rate, regular rhythm and normal heart sounds.   No murmur heard. No carotid bruit .  No edema Pulmonary/Chest: Effort normal and breath sounds normal. No respiratory distress. No has no wheezes. No rales.  Skin: Skin is warm and dry. Not diaphoretic.  Psychiatric: Normal mood and affect. Behavior is normal.      Assessment & Plan:    See Problem List for Assessment and Plan of chronic medical problems.    This visit occurred during the SARS-CoV-2 public health emergency.  Safety protocols were in place, including screening questions prior to the visit, additional usage of staff PPE, and extensive cleaning of exam room while observing appropriate contact time as indicated for disinfecting solutions.

## 2020-06-02 NOTE — Patient Instructions (Addendum)
  Blood work was ordered.   ° ° °Medications reviewed and updated.  Changes include :   none ° ° ° °Please followup in 6 months ° ° °

## 2020-06-03 ENCOUNTER — Ambulatory Visit: Payer: Medicare Other

## 2020-06-03 ENCOUNTER — Encounter: Payer: Self-pay | Admitting: Internal Medicine

## 2020-06-03 ENCOUNTER — Ambulatory Visit (INDEPENDENT_AMBULATORY_CARE_PROVIDER_SITE_OTHER): Payer: Medicare Other | Admitting: Internal Medicine

## 2020-06-03 ENCOUNTER — Other Ambulatory Visit: Payer: Self-pay

## 2020-06-03 VITALS — BP 144/70 | HR 61 | Temp 98.2°F | Resp 16 | Ht 62.0 in | Wt 255.0 lb

## 2020-06-03 DIAGNOSIS — M1711 Unilateral primary osteoarthritis, right knee: Secondary | ICD-10-CM

## 2020-06-03 DIAGNOSIS — E7849 Other hyperlipidemia: Secondary | ICD-10-CM | POA: Diagnosis not present

## 2020-06-03 DIAGNOSIS — F3289 Other specified depressive episodes: Secondary | ICD-10-CM | POA: Diagnosis not present

## 2020-06-03 DIAGNOSIS — I10 Essential (primary) hypertension: Secondary | ICD-10-CM

## 2020-06-03 DIAGNOSIS — E119 Type 2 diabetes mellitus without complications: Secondary | ICD-10-CM | POA: Diagnosis not present

## 2020-06-03 LAB — COMPREHENSIVE METABOLIC PANEL
ALT: 33 U/L (ref 0–35)
AST: 24 U/L (ref 0–37)
Albumin: 4.5 g/dL (ref 3.5–5.2)
Alkaline Phosphatase: 122 U/L — ABNORMAL HIGH (ref 39–117)
BUN: 16 mg/dL (ref 6–23)
CO2: 33 mEq/L — ABNORMAL HIGH (ref 19–32)
Calcium: 9.8 mg/dL (ref 8.4–10.5)
Chloride: 99 mEq/L (ref 96–112)
Creatinine, Ser: 0.53 mg/dL (ref 0.40–1.20)
GFR: 114.61 mL/min (ref >60.00)
Glucose, Bld: 117 mg/dL — ABNORMAL HIGH (ref 70–99)
Potassium: 4.6 mEq/L (ref 3.5–5.1)
Sodium: 140 mEq/L (ref 135–145)
Total Bilirubin: 1.1 mg/dL (ref 0.2–1.2)
Total Protein: 7.4 g/dL (ref 6.0–8.3)

## 2020-06-03 LAB — HEMOGLOBIN A1C: Hgb A1c MFr Bld: 6.1 % (ref 4.6–6.5)

## 2020-06-03 LAB — LIPID PANEL
Cholesterol: 204 mg/dL — ABNORMAL HIGH (ref 0–200)
HDL: 57.9 mg/dL (ref >39.00)
LDL Cholesterol: 106 mg/dL — ABNORMAL HIGH (ref 0–99)
NonHDL: 145.87
Total CHOL/HDL Ratio: 4
Triglycerides: 200 mg/dL — ABNORMAL HIGH (ref 0.0–149.0)
VLDL: 40 mg/dL (ref 0.0–40.0)

## 2020-06-03 NOTE — Assessment & Plan Note (Signed)
Chronic Controlled, stable Continue current dose of medication - sertraline 100 mg daily  

## 2020-06-03 NOTE — Assessment & Plan Note (Signed)
Chronic Diet controlled Check A1c Encouraged as much activity as possible Encouraged weight loss Continue low sugar/carbohydrate diet

## 2020-06-03 NOTE — Assessment & Plan Note (Signed)
Chronic BP well controlled Current regimen effective and well tolerated Continue current medications at current doses cmp  

## 2020-06-03 NOTE — Assessment & Plan Note (Addendum)
Chronic, severe in nature Eligible for knee replacement, but needs to lose weight and would like to avoid surgery Continue Tylenol Can try Voltaren gel Can take Advil as needed-discussed risks of long-term use Discussed prescription NSAIDs and we both would like to avoid this at this time Encouraged as much activity as possible and weight loss

## 2020-06-03 NOTE — Assessment & Plan Note (Signed)
Chronic Check lipid panel  Continue daily statin Regular exercise and healthy diet encouraged  

## 2020-06-05 ENCOUNTER — Encounter: Payer: Self-pay | Admitting: Internal Medicine

## 2020-06-05 ENCOUNTER — Other Ambulatory Visit: Payer: Self-pay | Admitting: Internal Medicine

## 2020-09-24 LAB — HM MAMMOGRAPHY

## 2020-09-27 ENCOUNTER — Encounter: Payer: Self-pay | Admitting: Internal Medicine

## 2020-10-06 LAB — HM DIABETES EYE EXAM

## 2020-11-10 ENCOUNTER — Encounter: Payer: Self-pay | Admitting: Internal Medicine

## 2020-11-10 NOTE — Progress Notes (Unsigned)
Outside notes received. Information abstracted. Notes sent to scan.  

## 2020-12-02 ENCOUNTER — Other Ambulatory Visit: Payer: Self-pay | Admitting: Internal Medicine

## 2020-12-05 NOTE — Patient Instructions (Addendum)
Blood work was ordered.     Flu immunization administered today.   Medications changes include :   none   Your prescription(s) have been submitted to your pharmacy.     Please followup in 6 months    Health Maintenance, Female Adopting a healthy lifestyle and getting preventive care are important in promoting health and wellness. Ask your health care provider about:  The right schedule for you to have regular tests and exams.  Things you can do on your own to prevent diseases and keep yourself healthy. What should I know about diet, weight, and exercise? Eat a healthy diet   Eat a diet that includes plenty of vegetables, fruits, low-fat dairy products, and lean protein.  Do not eat a lot of foods that are high in solid fats, added sugars, or sodium. Maintain a healthy weight Body mass index (BMI) is used to identify weight problems. It estimates body fat based on height and weight. Your health care provider can help determine your BMI and help you achieve or maintain a healthy weight. Get regular exercise Get regular exercise. This is one of the most important things you can do for your health. Most adults should:  Exercise for at least 150 minutes each week. The exercise should increase your heart rate and make you sweat (moderate-intensity exercise).  Do strengthening exercises at least twice a week. This is in addition to the moderate-intensity exercise.  Spend less time sitting. Even light physical activity can be beneficial. Watch cholesterol and blood lipids Have your blood tested for lipids and cholesterol at 68 years of age, then have this test every 5 years. Have your cholesterol levels checked more often if:  Your lipid or cholesterol levels are high.  You are older than 68 years of age.  You are at high risk for heart disease. What should I know about cancer screening? Depending on your health history and family history, you may need to have cancer  screening at various ages. This may include screening for:  Breast cancer.  Cervical cancer.  Colorectal cancer.  Skin cancer.  Lung cancer. What should I know about heart disease, diabetes, and high blood pressure? Blood pressure and heart disease  High blood pressure causes heart disease and increases the risk of stroke. This is more likely to develop in people who have high blood pressure readings, are of African descent, or are overweight.  Have your blood pressure checked: ? Every 3-5 years if you are 37-76 years of age. ? Every year if you are 92 years old or older. Diabetes Have regular diabetes screenings. This checks your fasting blood sugar level. Have the screening done:  Once every three years after age 23 if you are at a normal weight and have a low risk for diabetes.  More often and at a younger age if you are overweight or have a high risk for diabetes. What should I know about preventing infection? Hepatitis B If you have a higher risk for hepatitis B, you should be screened for this virus. Talk with your health care provider to find out if you are at risk for hepatitis B infection. Hepatitis C Testing is recommended for:  Everyone born from [birth year] through 1963.  Anyone with known risk factors for hepatitis C. Sexually transmitted infections (STIs)  Get screened for STIs, including gonorrhea and chlamydia, if: ? You are sexually active and are younger than 68 years of age. ? You are older than 68 years of age and  your health care provider tells you that you are at risk for this type of infection. ? Your sexual activity has changed since you were last screened, and you are at increased risk for chlamydia or gonorrhea. Ask your health care provider if you are at risk.  Ask your health care provider about whether you are at high risk for HIV. Your health care provider may recommend a prescription medicine to help prevent HIV infection. If you choose to take  medicine to prevent HIV, you should first get tested for HIV. You should then be tested every 3 months for as long as you are taking the medicine. Pregnancy  If you are about to stop having your period (premenopausal) and you may become pregnant, seek counseling before you get pregnant.  Take 400 to 800 micrograms (mcg) of folic acid every day if you become pregnant.  Ask for birth control (contraception) if you want to prevent pregnancy. Osteoporosis and menopause Osteoporosis is a disease in which the bones lose minerals and strength with aging. This can result in bone fractures. If you are 50 years old or older, or if you are at risk for osteoporosis and fractures, ask your health care provider if you should:  Be screened for bone loss.  Take a calcium or vitamin D supplement to lower your risk of fractures.  Be given hormone replacement therapy (HRT) to treat symptoms of menopause. Follow these instructions at home: Lifestyle  Do not use any products that contain nicotine or tobacco, such as cigarettes, e-cigarettes, and chewing tobacco. If you need help quitting, ask your health care provider.  Do not use street drugs.  Do not share needles.  Ask your health care provider for help if you need support or information about quitting drugs. Alcohol use  Do not drink alcohol if: ? Your health care provider tells you not to drink. ? You are pregnant, may be pregnant, or are planning to become pregnant.  If you drink alcohol: ? Limit how much you use to 0-1 drink a day. ? Limit intake if you are breastfeeding.  Be aware of how much alcohol is in your drink. In the [place] one drink equals one 12 oz bottle of beer (355 mL), one 5 oz glass of wine (148 mL), or one 1 oz glass of hard liquor (44 mL). General instructions  Schedule regular health, dental, and eye exams.  Stay current with your vaccines.  Tell your health care provider if: ? You often feel depressed. ? You have  ever been abused or do not feel safe at home. Summary  Adopting a healthy lifestyle and getting preventive care are important in promoting health and wellness.  Follow your health care provider's instructions about healthy diet, exercising, and getting tested or screened for diseases.  Follow your health care provider's instructions on monitoring your cholesterol and blood pressure. This information is not intended to replace advice given to you by your health care provider. Make sure you discuss any questions you have with your health care provider. Document Revised: 12/10/2018 Document Reviewed: 12/10/2018 Elsevier Patient Education  2020 [employer].

## 2020-12-05 NOTE — Progress Notes (Signed)
Subjective:    Patient ID: Jane Montgomery, female    DOB: 10-28-1950, 70 y.o.   MRN: [id number]   This visit occurred during the SARS-CoV-2 public health emergency.  Safety protocols were in place, including screening questions prior to the visit, additional usage of staff PPE, and extensive cleaning of exam room while observing appropriate contact time as indicated for disinfecting solutions.    HPI She is here for a physical exam.   She denies changes in her health since she was here last and she has no concerns.   Medications and allergies reviewed with patient and updated if appropriate.  Patient Active Problem List   Diagnosis Date Noted  . Systolic murmur 90/24/0973  . Vitamin D deficiency 10/06/2018  . Osteopenia 04/15/2017  . Diabetes mellitus without complication ([place]) 53/29/9242  . Need for prophylactic antibiotic for dental work 03/06/2016  . 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 OSA (obstructive sleep apnea) 10/04/2015  . Allergic rhinitis 08/20/2014  . 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 Benign paroxysmal positional vertigo 08/19/2013  . Hyperlipidemia 09/09/2007  . DISORDER, DYSMETABOLIC SYNDROME X 68/34/1962  . Depression 09/09/2007  . Essential hypertension 09/09/2007    Current Outpatient Medications on File Prior to Visit  Medication Sig Dispense Refill  . amLODipine (NORVASC) 5 MG tablet TAKE 1 TABLET(5 MG) BY MOUTH EVERY MORNING 90 tablet 1  . amoxicillin (AMOXIL) 500 MG tablet TK 4 TS PO 1 HOUR PRIOR TO APPOINTMENT  0  . aspirin 81 MG tablet Take 81 mg by mouth daily.      . Azelastine-Fluticasone (DYMISTA) 137-50 MCG/ACT SUSP Place into the nose.    . benazepril (LOTENSIN) 20 MG tablet TAKE 1 TABLET(20 MG) BY MOUTH DAILY 90 tablet 1  . Biotin 7500 MCG TABS Take 1 tablet by mouth daily.    . cholecalciferol (VITAMIN D) 1000 units tablet Take 5,000 Units by mouth daily.    . Coenzyme Q10 100 MG TABS Take 1 tablet by mouth daily.    Marland Kitchen  levocetirizine (XYZAL) 5 MG tablet Take 1 tablet by mouth daily.    . montelukast (SINGULAIR) 10 MG tablet Take 10 mg by mouth at bedtime.    . Multiple Vitamins-Minerals (ICAPS PO) Take 1 tablet by mouth 2 (two) times daily.     . pravastatin (PRAVACHOL) 40 MG tablet TAKE 1 TABLET(40 MG) BY MOUTH AT BEDTIME 90 tablet 1  . sertraline (ZOLOFT) 100 MG tablet TAKE 1 TABLET(100 MG) BY MOUTH DAILY 90 tablet 1   No current facility-administered medications on file prior to visit.    Past Medical History:  Diagnosis Date  . Allergy   . 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 Arthritis   . Benign positional vertigo    in LLDP  . Cataract    removed bilaterally   . Chronic back pain    reason unknown   . Depression    takes Zoloft daily- hormone imbalance - not truly depression per pt   . Diarrhea   . Diverticulosis   . Gestational diabetes 1982, 1984   "pre"  . Gilbert syndrome   . Heart murmur   . Hyperlipidemia    takes Pravastatin daily  . Hypertension    takes Amlodipine daily as well as Benazepril  . Joint pain   . Macular degeneration    early stages and dry  . Nocturia   . Osteoporosis   . Shortness of breath dyspnea  allergy related and occasionally will take Claritin.  . Sleep apnea    has cpap  . Tremor, essential    Dr Erling Cruz    Past Surgical History:  Procedure Laterality Date  . CATARACT EXTRACTION Bilateral 2015  . CESAREAN SECTION     x 2  . COLONOSCOPY  [phone]   Negative, redundant colon; [facility]  . DILATION AND CURETTAGE OF UTERUS     Dr Irven [place]  . PLANTAR FASCIA SURGERY Bilateral   . TONSILLECTOMY    . TOTAL SHOULDER ARTHROPLASTY Right 10/13/2015   Procedure: TOTAL SHOULDER ARTHROPLASTY;  Surgeon: [name], MD;  Location: [facility];  Service: Orthopedics;  Laterality: Right;  Right total shoulder arthroplasty    Social History   Socioeconomic History  . Marital status: Married    Spouse name: Not on file  . Number of  children: 2  . Years of education: Not on file  . Highest education level: Not on file  Occupational History  . Not on file  Tobacco Use  . Smoking status: Never Smoker  . Smokeless tobacco: Never Used  Vaping Use  . Vaping Use: Never used  Substance and Sexual Activity  . Alcohol use: No  . Drug use: No  . Sexual activity: Yes    Birth control/protection: Post-menopausal  Other Topics Concern  . Not on file  Social History Narrative  . Not on file   Social Determinants of Health   Financial Resource Strain:   . Difficulty of Paying Living Expenses: Not on file  Food Insecurity:   . Worried About Charity fundraiser in the Last Year: Not on file  . Ran Out of Food in the Last Year: Not on file  Transportation Needs:   . Lack of Transportation (Medical): Not on file  . Lack of Transportation (Non-Medical): Not on file  Physical Activity:   . Days of Exercise per Week: Not on file  . Minutes of Exercise per Session: Not on file  Stress:   . Feeling of Stress : Not on file  Social Connections:   . Frequency of Communication with Friends and Family: Not on file  . Frequency of Social Gatherings with Friends and Family: Not on file  . Attends Religious Services: Not on file  . Active Member of Clubs or Organizations: Not on file  . Attends Archivist Meetings: Not on file  . Marital Status: Not on file    Family History  Problem Relation Age of Onset  . Hypertension Father   . Heart attack Father 27       CABG   . Colon cancer Mother 19  . Macular degeneration Mother   . Hypertension Mother   . Seizures Mother        ? etiology (preceded CVA)  . Stroke Mother 4  . Osteoporosis Mother   . Myelodysplastic syndrome Mother   . Colon cancer Maternal Grandmother   . Colon cancer Maternal Aunt   . Colon cancer Other        2 Maternal Great Aunts  . Heart attack Paternal Grandfather        > 55  . Heart attack Paternal Aunt 39  . Leukemia Paternal  Grandmother   . Diabetes Neg Hx   . Colon polyps Neg Hx   . Esophageal cancer Neg Hx   . Rectal cancer Neg Hx   . Stomach cancer Neg Hx     Review of Systems  Constitutional: Negative for  chills and fever.  Eyes: Negative for visual disturbance.  Respiratory: Negative for cough, shortness of breath and wheezing.   Cardiovascular: Negative for chest pain, palpitations and leg swelling.  Gastrointestinal: Negative for abdominal pain, blood in stool, constipation, diarrhea and nausea.       No gerd  Genitourinary: Negative for dysuria and hematuria.  Musculoskeletal: Positive for arthralgias (right knee, left elbow) and back pain.  Skin: Positive for rash (right ear - itchy). Negative for color change.  Neurological: Positive for dizziness (occ, BPPV). Negative for light-headedness and headaches.  Psychiatric/Behavioral: Negative for dysphoric mood. The patient is not nervous/anxious.        Objective:   Vitals:   12/06/20 0854  BP: 140/78  Pulse: 80  Temp: 98.3 F (36.8 C)  SpO2: 97%   Filed Weights   12/06/20 0854  Weight: 253 lb (114.8 kg)   Body mass index is 46.27 kg/m.  BP Readings from Last 3 Encounters:  12/06/20 140/78  06/03/20 (!) 144/70  12/04/19 (!) 148/90    Wt Readings from Last 3 Encounters:  12/06/20 253 lb (114.8 kg)  06/03/20 255 lb (115.7 kg)  12/04/19 261 lb 12.8 oz (118.8 kg)   Depression screen [facility] 2/9 12/06/2020 06/04/2019 06/04/2019 04/10/2018 10/03/2017  Decreased Interest 1 0 0 1 0  Down, Depressed, Hopeless 0 0 0 1 0  PHQ - 2 Score 1 0 0 2 0  Altered sleeping 0 3 - 1 -  Tired, decreased energy 1 1 - 1 -  Change in appetite 0 0 - 0 -  Feeling bad or failure about yourself  0 0 - 1 -  Trouble concentrating 0 0 - 0 -  Moving slowly or fidgety/restless 0 0 - 0 -  Suicidal thoughts 0 0 - 0 -  PHQ-9 Score 2 4 - 5 -  Difficult doing work/chores Not difficult at all Not difficult at all - Not difficult at all -      Physical  Exam Constitutional: She appears well-developed and well-nourished. No distress.  HENT:  Head: Normocephalic and atraumatic.  Right Ear: External ear normal, except for minimally dry skin. Normal ear canal and TM Left Ear: External ear normal.  Normal ear canal and TM Mouth/Throat: Oropharynx is clear and moist.  Eyes: Conjunctivae and EOM are normal.  Neck: Neck supple. No tracheal deviation present. No thyromegaly present.  No carotid bruit  Cardiovascular: Normal rate, regular rhythm and normal heart sounds.   2/6 systolic murmur heard.  No edema. Pulmonary/Chest: Effort normal and breath sounds normal. No respiratory distress. She has no wheezes. She has no rales.  Breast: deferred   Abdominal: Soft. Obese.  She exhibits no distension. There is no tenderness.  Lymphadenopathy: She has no cervical adenopathy.  Skin: Skin is warm and dry. She is not diaphoretic.  Psychiatric: She has a normal mood and affect. Her behavior is normal.        Assessment & Plan:   Physical exam: Screening blood work    ordered Immunizations  Flu vac today, never had covid vaccine - discussed covid vaccine and recommended she get it, discussed shingrix Colonoscopy  Up to date  Mammogram  Up to date  Dexa  Deferred - had screening over the summer with [facility] Eye exams  Up to date  Exercise  Chair exercises Weight  Encouraged weight loss Substance abuse   none   Screened for depression using the PHQ 9 scale.  She has depression and it is well  controlled per her and according to the PHQ9 scale.  No change in medication needed.       See Problem List for Assessment and Plan of chronic medical problems.

## 2020-12-06 ENCOUNTER — Ambulatory Visit (INDEPENDENT_AMBULATORY_CARE_PROVIDER_SITE_OTHER): Payer: Medicare Other | Admitting: Internal Medicine

## 2020-12-06 ENCOUNTER — Other Ambulatory Visit: Payer: Self-pay

## 2020-12-06 ENCOUNTER — Encounter: Payer: Self-pay | Admitting: Internal Medicine

## 2020-12-06 VITALS — BP 140/78 | HR 80 | Temp 98.3°F | Ht 62.0 in | Wt 253.0 lb

## 2020-12-06 DIAGNOSIS — Z23 Encounter for immunization: Secondary | ICD-10-CM | POA: Diagnosis not present

## 2020-12-06 DIAGNOSIS — R011 Cardiac murmur, unspecified: Secondary | ICD-10-CM

## 2020-12-06 DIAGNOSIS — I10 Essential (primary) hypertension: Secondary | ICD-10-CM | POA: Diagnosis not present

## 2020-12-06 DIAGNOSIS — Z0000 Encounter for general adult medical examination without abnormal findings: Secondary | ICD-10-CM

## 2020-12-06 DIAGNOSIS — M85851 Other specified disorders of bone density and structure, right thigh: Secondary | ICD-10-CM

## 2020-12-06 DIAGNOSIS — E119 Type 2 diabetes mellitus without complications: Secondary | ICD-10-CM | POA: Diagnosis not present

## 2020-12-06 DIAGNOSIS — M85852 Other specified disorders of bone density and structure, left thigh: Secondary | ICD-10-CM

## 2020-12-06 DIAGNOSIS — E7849 Other hyperlipidemia: Secondary | ICD-10-CM | POA: Diagnosis not present

## 2020-12-06 DIAGNOSIS — Z0001 Encounter for general adult medical examination with abnormal findings: Secondary | ICD-10-CM | POA: Diagnosis not present

## 2020-12-06 DIAGNOSIS — E559 Vitamin D deficiency, unspecified: Secondary | ICD-10-CM | POA: Diagnosis not present

## 2020-12-06 DIAGNOSIS — F3289 Other specified depressive episodes: Secondary | ICD-10-CM

## 2020-12-06 LAB — COMPREHENSIVE METABOLIC PANEL
ALT: 35 U/L (ref 0–35)
AST: 23 U/L (ref 0–37)
Albumin: 4.5 g/dL (ref 3.5–5.2)
Alkaline Phosphatase: 132 U/L — ABNORMAL HIGH (ref 39–117)
BUN: 19 mg/dL (ref 6–23)
CO2: 30 mEq/L (ref 19–32)
Calcium: 9.4 mg/dL (ref 8.4–10.5)
Chloride: 101 mEq/L (ref 96–112)
Creatinine, Ser: 0.51 mg/dL (ref 0.40–1.20)
GFR: 95.65 mL/min (ref >60.00)
Glucose, Bld: 105 mg/dL — ABNORMAL HIGH (ref 70–99)
Potassium: 4.3 mEq/L (ref 3.5–5.1)
Sodium: 141 mEq/L (ref 135–145)
Total Bilirubin: 0.9 mg/dL (ref 0.2–1.2)
Total Protein: 7.4 g/dL (ref 6.0–8.3)

## 2020-12-06 LAB — TSH: TSH: 1.23 u[IU]/mL (ref 0.35–4.50)

## 2020-12-06 LAB — LDL CHOLESTEROL, DIRECT: Direct LDL: 120 mg/dL

## 2020-12-06 LAB — CBC WITH DIFFERENTIAL/PLATELET
Basophils Absolute: 0 10*3/uL (ref 0.0–0.1)
Basophils Relative: 0.6 % (ref 0.0–3.0)
Eosinophils Absolute: 0.1 10*3/uL (ref 0.0–0.7)
Eosinophils Relative: 2.6 % (ref 0.0–5.0)
HCT: 40.2 % (ref 36.0–46.0)
Hemoglobin: 13.1 g/dL (ref 12.0–15.0)
Lymphocytes Relative: 28.3 % (ref 12.0–46.0)
Lymphs Abs: 1.5 10*3/uL (ref 0.7–4.0)
MCHC: 32.7 g/dL (ref 30.0–36.0)
MCV: 88.4 fl (ref 78.0–100.0)
Monocytes Absolute: 0.3 10*3/uL (ref 0.1–1.0)
Monocytes Relative: 5.6 % (ref 3.0–12.0)
Neutro Abs: 3.3 10*3/uL (ref 1.4–7.7)
Neutrophils Relative %: 62.9 % (ref 43.0–77.0)
Platelets: 186 10*3/uL (ref 150.0–400.0)
RBC: 4.55 Mil/uL (ref 3.87–5.11)
RDW: 14.3 % (ref 11.5–15.5)
WBC: 5.3 10*3/uL (ref 4.0–10.5)

## 2020-12-06 LAB — LIPID PANEL
Cholesterol: 213 mg/dL — ABNORMAL HIGH (ref 0–200)
HDL: 55.5 mg/dL (ref >39.00)
NonHDL: 157.47
Total CHOL/HDL Ratio: 4
Triglycerides: 247 mg/dL — ABNORMAL HIGH (ref 0.0–149.0)
VLDL: 49.4 mg/dL — ABNORMAL HIGH (ref 0.0–40.0)

## 2020-12-06 LAB — VITAMIN D 25 HYDROXY (VIT D DEFICIENCY, FRACTURES): VITD: 36.26 ng/mL (ref 30.00–100.00)

## 2020-12-06 LAB — HEMOGLOBIN A1C: Hgb A1c MFr Bld: 5.9 % (ref 4.6–6.5)

## 2020-12-06 NOTE — Assessment & Plan Note (Signed)
Chronic Check lipid panel  Continue pravastatin 40 mg daily Regular exercise and healthy diet encouraged  

## 2020-12-06 NOTE — Assessment & Plan Note (Signed)
Chronic BP controlled Continue amlodipine 5 mg daily, benazepril 20 mg daily cmp

## 2020-12-06 NOTE — Assessment & Plan Note (Signed)
Chronic Taking vitamin D daily Check vitamin D level  

## 2020-12-06 NOTE — Assessment & Plan Note (Signed)
Chronic Diet controlled Check a1c Low sugar / carb diet encouraged regular exercise, weight loss

## 2020-12-06 NOTE — Assessment & Plan Note (Signed)
Chronic Controlled, stable Continue  sertraline 100 mg daily 

## 2020-12-06 NOTE — Assessment & Plan Note (Signed)
Chronic 2/6 on exam Echo 2012 - aortic valve calcification Will monitor

## 2020-12-06 NOTE — Assessment & Plan Note (Signed)
Chronic dexa deferred - she did have screening with [facility] over the summer - report not available to me Doing chair exercises Taking vitamin d daily

## 2020-12-06 NOTE — Addendum Note (Signed)
Addended by: Eddie North C on: 12/06/2020 10:04 AM   Modules accepted: Orders

## 2021-02-16 ENCOUNTER — Telehealth: Payer: Self-pay | Admitting: Internal Medicine

## 2021-02-16 NOTE — Telephone Encounter (Signed)
LVM for pt to rtn my call to schedule awv with [facility]. Please schedule AWV with [facility] if pt calls the office.

## 2021-02-28 ENCOUNTER — Other Ambulatory Visit: Payer: Self-pay

## 2021-02-28 ENCOUNTER — Ambulatory Visit (INDEPENDENT_AMBULATORY_CARE_PROVIDER_SITE_OTHER): Payer: Medicare Other

## 2021-02-28 VITALS — BP 128/70 | HR 62 | Temp 98.1°F | Ht 62.0 in | Wt 255.6 lb

## 2021-02-28 DIAGNOSIS — Z0000 Encounter for general adult medical examination without abnormal findings: Secondary | ICD-10-CM | POA: Diagnosis not present

## 2021-02-28 NOTE — Patient Instructions (Signed)
Ms. Biswell , Thank you for taking time to come for your Medicare Wellness Visit. I appreciate your ongoing commitment to your health goals. Please review the following plan we discussed and let me know if I can assist you in the future.   Screening recommendations/referrals: Colonoscopy: 07/14/2018; due every 5 years Mammogram: 09/24/2020; due every year Bone Density: 04/11/2017; due every 3 years Recommended yearly ophthalmology/optometry visit for glaucoma screening and checkup Recommended yearly dental visit for hygiene and checkup  Vaccinations: Influenza vaccine: 12/06/2020 Pneumococcal vaccine: 09/17/2012, 04/03/2017 Tdap vaccine: 08/13/2012 Shingles vaccine: never done   Covid-19: never done  Advanced directives: Please bring a copy of your health care power of attorney and living will to the office at your convenience.  Conditions/risks identified: Yes; Reviewed health maintenance screenings with patient today and relevant education, vaccines, and/or referrals were provided. Please continue to do your personal lifestyle choices by: daily care of teeth and gums, regular physical activity (goal should be 5 days a week for 30 minutes), eat a healthy diet, avoid tobacco and drug use, limiting any alcohol intake, taking a low-dose aspirin (if not allergic or have been advised by your provider otherwise) and taking vitamins and minerals as recommended by your provider. Continue doing brain stimulating activities (puzzles, reading, adult coloring books, staying active) to keep memory sharp. Continue to eat heart healthy diet (full of fruits, vegetables, whole grains, lean protein, water--limit salt, fat, and sugar intake) and increase physical activity as tolerated.  Next appointment: Please schedule your next Medicare Wellness Visit with your Nurse Health Advisor in 1 year by calling [phone].   Preventive Care 69 Years and Older, Female Preventive care refers to lifestyle choices and visits  with your health care provider that can promote health and wellness. What does preventive care include?  A yearly physical exam. This is also called an annual well check.  Dental exams once or twice a year.  Routine eye exams. Ask your health care provider how often you should have your eyes checked.  Personal lifestyle choices, including:  Daily care of your teeth and gums.  Regular physical activity.  Eating a healthy diet.  Avoiding tobacco and drug use.  Limiting alcohol use.  Practicing safe sex.  Taking low-dose aspirin every day.  Taking vitamin and mineral supplements as recommended by your health care provider. What happens during an annual well check? The services and screenings done by your health care provider during your annual well check will depend on your age, overall health, lifestyle risk factors, and family history of disease. Counseling  Your health care provider may ask you questions about your:  Alcohol use.  Tobacco use.  Drug use.  Emotional well-being.  Home and relationship well-being.  Sexual activity.  Eating habits.  History of falls.  Memory and ability to understand (cognition).  Work and work Statistician.  Reproductive health. Screening  You may have the following tests or measurements:  Height, weight, and BMI.  Blood pressure.  Lipid and cholesterol levels. These may be checked every 5 years, or more frequently if you are over 20 years old.  Skin check.  Lung cancer screening. You may have this screening every year starting at age 70 if you have a 30-pack-year history of smoking and currently smoke or have quit within the past 15 years.  Fecal occult blood test (FOBT) of the stool. You may have this test every year starting at age 39.  Flexible sigmoidoscopy or colonoscopy. You may have a sigmoidoscopy every  5 years or a colonoscopy every 10 years starting at age 11.  Hepatitis C blood test.  Hepatitis B blood  test.  Sexually transmitted disease (STD) testing.  Diabetes screening. This is done by checking your blood sugar (glucose) after you have not eaten for a while (fasting). You may have this done every 1-3 years.  Bone density scan. This is done to screen for osteoporosis. You may have this done starting at age 78.  Mammogram. This may be done every 1-2 years. Talk to your health care provider about how often you should have regular mammograms. Talk with your health care provider about your test results, treatment options, and if necessary, the need for more tests. Vaccines  Your health care provider may recommend certain vaccines, such as:  Influenza vaccine. This is recommended every year.  Tetanus, diphtheria, and acellular pertussis (Tdap, Td) vaccine. You may need a Td booster every 10 years.  Zoster vaccine. You may need this after age 66.  Pneumococcal 13-valent conjugate (PCV13) vaccine. One dose is recommended after age 62.  Pneumococcal polysaccharide (PPSV23) vaccine. One dose is recommended after age 41. Talk to your health care provider about which screenings and vaccines you need and how often you need them. This information is not intended to replace advice given to you by your health care provider. Make sure you discuss any questions you have with your health care provider. Document Released: 01/13/2016 Document Revised: 09/05/2016 Document Reviewed: 10/18/2015 Elsevier Interactive Patient Education  2017 [place] Prevention in the Home Falls can cause injuries. They can happen to people of all ages. There are many things you can do to make your home safe and to help prevent falls. What can I do on the outside of my home?  Regularly fix the edges of walkways and driveways and fix any cracks.  Remove anything that might make you trip as you walk through a door, such as a raised step or threshold.  Trim any bushes or trees on the path to your home.  Use  bright outdoor lighting.  Clear any walking paths of anything that might make someone trip, such as rocks or tools.  Regularly check to see if handrails are loose or broken. Make sure that both sides of any steps have handrails.  Any raised decks and porches should have guardrails on the edges.  Have any leaves, snow, or ice cleared regularly.  Use sand or salt on walking paths during winter.  Clean up any spills in your garage right away. This includes oil or grease spills. What can I do in the bathroom?  Use night lights.  Install grab bars by the toilet and in the tub and shower. Do not use towel bars as grab bars.  Use non-skid mats or decals in the tub or shower.  If you need to sit down in the shower, use a plastic, non-slip stool.  Keep the floor dry. Clean up any water that spills on the floor as soon as it happens.  Remove soap buildup in the tub or shower regularly.  Attach bath mats securely with double-sided non-slip rug tape.  Do not have throw rugs and other things on the floor that can make you trip. What can I do in the bedroom?  Use night lights.  Make sure that you have a light by your bed that is easy to reach.  Do not use any sheets or blankets that are too big for your bed. They should not  hang down onto the floor.  Have a firm chair that has side arms. You can use this for support while you get dressed.  Do not have throw rugs and other things on the floor that can make you trip. What can I do in the kitchen?  Clean up any spills right away.  Avoid walking on wet floors.  Keep items that you use a lot in easy-to-reach places.  If you need to reach something above you, use a strong step stool that has a grab bar.  Keep electrical cords out of the way.  Do not use floor polish or wax that makes floors slippery. If you must use wax, use non-skid floor wax.  Do not have throw rugs and other things on the floor that can make you trip. What can  I do with my stairs?  Do not leave any items on the stairs.  Make sure that there are handrails on both sides of the stairs and use them. Fix handrails that are broken or loose. Make sure that handrails are as long as the stairways.  Check any carpeting to make sure that it is firmly attached to the stairs. Fix any carpet that is loose or worn.  Avoid having throw rugs at the top or bottom of the stairs. If you do have throw rugs, attach them to the floor with carpet tape.  Make sure that you have a light switch at the top of the stairs and the bottom of the stairs. If you do not have them, ask someone to add them for you. What else can I do to help prevent falls?  Wear shoes that:  Do not have high heels.  Have rubber bottoms.  Are comfortable and fit you well.  Are closed at the toe. Do not wear sandals.  If you use a stepladder:  Make sure that it is fully opened. Do not climb a closed stepladder.  Make sure that both sides of the stepladder are locked into place.  Ask someone to hold it for you, if possible.  Clearly mark and make sure that you can see:  Any grab bars or handrails.  First and last steps.  Where the edge of each step is.  Use tools that help you move around (mobility aids) if they are needed. These include:  Canes.  Walkers.  Scooters.  Crutches.  Turn on the lights when you go into a dark area. Replace any light bulbs as soon as they burn out.  Set up your furniture so you have a clear path. Avoid moving your furniture around.  If any of your floors are uneven, fix them.  If there are any pets around you, be aware of where they are.  Review your medicines with your doctor. Some medicines can make you feel dizzy. This can increase your chance of falling. Ask your doctor what other things that you can do to help prevent falls. This information is not intended to replace advice given to you by your health care provider. Make sure you  discuss any questions you have with your health care provider. Document Released: 10/13/2009 Document Revised: 05/24/2016 Document Reviewed: 01/21/2015 Elsevier Interactive Patient Education  2017 [employer].

## 2021-02-28 NOTE — Progress Notes (Signed)
Subjective:   Jane Montgomery is a 71 y.o. female who presents for Medicare Annual (Subsequent) preventive examination.  Review of Systems    No ROS. Medicare Wellness Visit. Additional risk factors are reflected in social history. Cardiac Risk Factors include: advanced age (>59men, >83 women);dyslipidemia;hypertension;family history of premature cardiovascular disease;obesity (BMI >30kg/m2)     Objective:    Today's Vitals   02/28/21 1107 02/28/21 1108  BP: 128/70   Pulse: 62   Temp: 98.1 F (36.7 C)   SpO2: 97%   Weight: 255 lb 9.6 oz (115.9 kg)   Height: 5\' 2"  (1.575 m)   PainSc: 7  7    Body mass index is 46.75 kg/m.  Advanced Directives 02/28/2021 06/04/2019 07/14/2018 04/10/2018 10/14/2015 10/05/2015 10/04/2015  Does Patient Have a Medical Advance Directive? Yes No No No No No No  Type of Paramedic of [place];Living will - - - - - -  Does patient want to make changes to medical advance directive? No - Patient declined - - Yes (ED - Information included in AVS) - - -  Copy of [place] in Chart? No - copy requested - - - - - -  Would patient like information on creating a medical advance directive? - No - Patient declined No - Patient declined - Yes - Educational materials given No - patient declined information No - patient declined information    Current Medications (verified) Outpatient Encounter Medications as of 02/28/2021  Medication Sig  . amLODipine (NORVASC) 5 MG tablet TAKE 1 TABLET(5 MG) BY MOUTH EVERY MORNING  . amoxicillin (AMOXIL) 500 MG tablet TK 4 TS PO 1 HOUR PRIOR TO APPOINTMENT  . aspirin 81 MG tablet Take 81 mg by mouth daily.  . Azelastine-Fluticasone 137-50 MCG/ACT SUSP Place into the nose.  . benazepril (LOTENSIN) 20 MG tablet TAKE 1 TABLET(20 MG) BY MOUTH DAILY  . Biotin 7500 MCG TABS Take 1 tablet by mouth daily.  . cholecalciferol (VITAMIN D) 1000 units tablet Take 5,000 Units by mouth daily.  Marland Kitchen  levocetirizine (XYZAL) 5 MG tablet Take 1 tablet by mouth daily.  . montelukast (SINGULAIR) 10 MG tablet Take 10 mg by mouth at bedtime.  . Multiple Vitamins-Minerals (ICAPS PO) Take 1 tablet by mouth 2 (two) times daily.  . pravastatin (PRAVACHOL) 40 MG tablet TAKE 1 TABLET(40 MG) BY MOUTH AT BEDTIME  . sertraline (ZOLOFT) 100 MG tablet TAKE 1 TABLET(100 MG) BY MOUTH DAILY  . Coenzyme Q10 100 MG TABS Take 1 tablet by mouth daily. (Patient not taking: Reported on 02/28/2021)   No facility-administered encounter medications on file as of 02/28/2021.    Allergies (verified) Patient has no known allergies.   History: Past Medical History:  Diagnosis Date  . Allergy   . 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 Arthritis   . Benign positional vertigo    in LLDP  . Cataract    removed bilaterally   . Chronic back pain    reason unknown   . Depression    takes Zoloft daily- hormone imbalance - not truly depression per pt   . Diarrhea   . Diverticulosis   . Gestational diabetes 1982, 1984   "pre"  . Gilbert syndrome   . Heart murmur   . Hyperlipidemia    takes Pravastatin daily  . Hypertension    takes Amlodipine daily as well as Benazepril  . Joint pain   . Macular degeneration  early stages and dry  . Nocturia   . Osteoporosis   . Shortness of breath dyspnea    allergy related and occasionally will take Claritin.  . Sleep apnea    has cpap  . Tremor, essential    Dr Erling Cruz   Past Surgical History:  Procedure Laterality Date  . CATARACT EXTRACTION Bilateral 2015  . CESAREAN SECTION     x 2  . COLONOSCOPY  [phone]   Negative, redundant colon; [facility]  . DILATION AND CURETTAGE OF UTERUS     Dr Irven [place]  . PLANTAR FASCIA SURGERY Bilateral   . TONSILLECTOMY    . TOTAL SHOULDER ARTHROPLASTY Right 10/13/2015   Procedure: TOTAL SHOULDER ARTHROPLASTY;  Surgeon: [name], MD;  Location: [facility];  Service: Orthopedics;  Laterality: Right;  Right total  shoulder arthroplasty   Family History  Problem Relation Age of Onset  . Hypertension Father   . Heart attack Father 77       CABG   . Colon cancer Mother 39  . Macular degeneration Mother   . Hypertension Mother   . Seizures Mother        ? etiology (preceded CVA)  . Stroke Mother 65  . Osteoporosis Mother   . Myelodysplastic syndrome Mother   . Colon cancer Maternal Grandmother   . Colon cancer Maternal Aunt   . Colon cancer Other        2 Maternal Great Aunts  . Heart attack Paternal Grandfather        > 55  . Heart attack Paternal Aunt 63  . Leukemia Paternal Grandmother   . Diabetes Neg Hx   . Colon polyps Neg Hx   . Esophageal cancer Neg Hx   . Rectal cancer Neg Hx   . Stomach cancer Neg Hx    Social History   Socioeconomic History  . Marital status: Married    Spouse name: Not on file  . Number of children: 2  . Years of education: Not on file  . Highest education level: Not on file  Occupational History  . Not on file  Tobacco Use  . Smoking status: Never Smoker  . Smokeless tobacco: Never Used  Vaping Use  . Vaping Use: Never used  Substance and Sexual Activity  . Alcohol use: No  . Drug use: No  . Sexual activity: Yes    Birth control/protection: Post-menopausal  Other Topics Concern  . Not on file  Social History Narrative  . Not on file   Social Determinants of Health   Financial Resource Strain: Low Risk   . Difficulty of Paying Living Expenses: Not hard at all  Food Insecurity: No Food Insecurity  . Worried About Charity fundraiser in the Last Year: Never true  . Ran Out of Food in the Last Year: Never true  Transportation Needs: No Transportation Needs  . Lack of Transportation (Medical): No  . Lack of Transportation (Non-Medical): No  Physical Activity: Sufficiently Active  . Days of Exercise per Week: 5 days  . Minutes of Exercise per Session: 30 min  Stress: No Stress Concern Present  . Feeling of Stress : Not at all  Social  Connections: Moderately Integrated  . Frequency of Communication with Friends and Family: More than three times a week  . Frequency of Social Gatherings with Friends and Family: Once a week  . Attends Religious Services: 1 to 4 times per year  . Active Member of Clubs or Organizations: No  .  Attends Archivist Meetings: 1 to 4 times per year  . Marital Status: Widowed    Tobacco Counseling Counseling given: Not Answered   Clinical Intake:  Pre-visit preparation completed: Yes  Pain : 0-10 Pain Score: 7  Pain Type: Chronic pain Pain Location: Knee Pain Orientation: Right Pain Radiating Towards: n/a Pain Descriptors / Indicators: Constant,Discomfort,Pressure Pain Onset: More than a month ago Pain Frequency: Constant Pain Relieving Factors: none Effect of Pain on Daily Activities: Pain can diminish job performance, lower motivation to exercise, and prevent you from completing daily tasks. Pain produces disability and affects the quality of life.  Pain Relieving Factors: none  BMI - recorded: 46.75 Nutritional Status: BMI > 30  Obese Nutritional Risks: None Diabetes: Yes CBG done?: No Did pt. bring in CBG monitor from home?: No  How often do you need to have someone help you when you read instructions, pamphlets, or other written materials from your doctor or pharmacy?: 1 - Never What is the last grade level you completed in school?: High School Graduate  Diabetic? no  Interpreter Needed?: No  Information entered by :: Lisette Abu, LPN   Activities of Daily Living In your present state of health, do you have any difficulty performing the following activities: 02/28/2021 12/06/2020  Hearing? N N  Vision? N N  Difficulty concentrating or making decisions? N N  Walking or climbing stairs? N N  Dressing or bathing? N N  Doing errands, shopping? N N  Preparing Food and eating ? N -  Using the Toilet? N -  In the past six months, have you accidently leaked  urine? Y -  Do you have problems with loss of bowel control? N -  Managing your Medications? N -  Managing your Finances? N -  Housekeeping or managing your Housekeeping? N -  Some recent data might be hidden    Patient Care Team: Binnie Rail, MD as PCP - General (Internal Medicine) Monna Fam, MD as Consulting Physician (Ophthalmology)  Indicate any recent Medical Services you may have received from other than [facility] providers in the past year (date may be approximate).     Assessment:   This is a routine wellness examination for Jane Montgomery.  Hearing/Vision screen No exam data present  Dietary issues and exercise activities discussed: Current Exercise Habits: Home exercise routine, Type of exercise: Other - see comments (chair exercises), Time (Minutes): 30, Frequency (Times/Week): 5, Weekly Exercise (Minutes/Week): 150, Intensity: Moderate, Exercise limited by: orthopedic condition(s)  Goals    . Patient Stated     Increase my physical activity I will exercise on a routine basis 3 times weekly. I will do chair exercise while watching TV. Continue to do portion control, cut down on soda, and drink water.      Depression Screen PHQ 2/9 Scores 02/28/2021 12/06/2020 06/04/2019 06/04/2019 04/10/2018 10/03/2017 04/03/2017  PHQ - 2 Score 0 1 0 0 2 0 0  PHQ- 9 Score - 2 4 - 5 - -    Fall Risk Fall Risk  02/28/2021 06/03/2020 06/04/2019 06/04/2019 04/10/2018  Falls in the past year? 0 0 0 0 No  Number falls in past yr: 0 0 0 0 -  Injury with Fall? 0 0 - 0 -  Risk for fall due to : No Fall Risks - Impaired balance/gait;Impaired mobility - Impaired balance/gait;Impaired mobility  Follow up - Falls evaluation completed Falls prevention discussed - -    FALL RISK PREVENTION PERTAINING TO THE HOME:  Any  stairs in or around the home? No  If so, are there any without handrails? No  Home free of loose throw rugs in walkways, pet beds, electrical cords, etc? Yes  Adequate lighting in your home to reduce  risk of falls? Yes   ASSISTIVE DEVICES UTILIZED TO PREVENT FALLS:  Life alert? No  Use of a cane, walker or w/c? No  Grab bars in the bathroom? Yes  Shower chair or bench in shower? Yes  Elevated toilet seat or a handicapped toilet? Yes   TIMED UP AND GO:  Was the test performed? No .  Length of time to ambulate 10 feet: 0 sec.   Gait steady and fast without use of assistive device  Cognitive Function: Normal cognitive status assessed by direct observation by this Nurse Health Advisor. No abnormalities found.          Immunizations Immunization History  Administered Date(s) Administered  . Fluad Quad(high Dose 65+) 12/06/2020  . Influenza Split 01/23/2012, 09/08/2014  . Influenza Whole 10/02/2008  . Influenza, High Dose Seasonal PF 09/26/2017, 10/06/2018, 11/09/2019  . Influenza-Unspecified 10/14/2013, 10/01/2015, 09/20/2016, 09/26/2017  . Pneumococcal Conjugate-13 09/17/2012  . Pneumococcal Polysaccharide-23 04/03/2017  . Tdap 08/13/2012  . Zoster 10/14/2013    TDAP status: Up to date  Flu Vaccine status: Up to date  Pneumococcal vaccine status: Up to date  Covid-19 vaccine status: Declined, Education has been provided regarding the importance of this vaccine but patient still declined. Advised may receive this vaccine at local pharmacy or Health Dept.or vaccine clinic. Aware to provide a copy of the vaccination record if obtained from local pharmacy or Health Dept. Verbalized acceptance and understanding.  Qualifies for Shingles Vaccine? Yes   Zostavax completed Yes   Shingrix Completed?: No.    Education has been provided regarding the importance of this vaccine. Patient has been advised to call insurance company to determine out of pocket expense if they have not yet received this vaccine. Advised may also receive vaccine at local pharmacy or Health Dept. Verbalized acceptance and understanding.  Screening Tests Health Maintenance  Topic Date Due  . FOOT EXAM   10/07/2019  . COVID-19 Vaccine (1) 03/16/2021 (Originally 02/11/1955)  . DEXA SCAN  06/03/2021 (Originally 04/11/2020)  . HEMOGLOBIN A1C  06/06/2021  . OPHTHALMOLOGY EXAM  10/06/2021  . TETANUS/TDAP  08/13/2022  . MAMMOGRAM  09/24/2022  . COLONOSCOPY (Pts 45-17yrs Insurance coverage will need to be confirmed)  07/15/2023  . INFLUENZA VACCINE  Completed  . Hepatitis C Screening  Completed  . PNA vac Low Risk Adult  Completed  . HPV VACCINES  Aged Out    Health Maintenance  Health Maintenance Due  Topic Date Due  . FOOT EXAM  10/07/2019    Colorectal cancer screening: Type of screening: Colonoscopy. Completed 07/14/2018. Repeat every 5 years  Mammogram status: Completed 09/24/2020. Repeat every year  Bone Density status: Completed 04/11/2017. Results reflect: Bone density results: OSTEOPENIA. Repeat every 2-3 years.  Lung Cancer Screening: (Low Dose CT Chest recommended if Age 72-80 years, 30 pack-year currently smoking OR have quit w/in 15years.) does not qualify.   Lung Cancer Screening Referral: no  Additional Screening:  Hepatitis C Screening: does qualify; Completed yes  Vision Screening: Recommended annual ophthalmology exams for early detection of glaucoma and other disorders of the eye. Is the patient up to date with their annual eye exam?  Yes  Who is the provider or what is the name of the office in which the patient attends annual  eye exams? Monna Fam, MD. If pt is not established with a provider, would they like to be referred to a provider to establish care? No .   Dental Screening: Recommended annual dental exams for proper oral hygiene  Community Resource Referral / Chronic Care Management: CRR required this visit?  No   CCM required this visit?  No      Plan:     I have personally reviewed and noted the following in the patient's chart:   . Medical and social history . Use of alcohol, tobacco or illicit drugs  . Current medications and  supplements . Functional ability and status . Nutritional status . Physical activity . Advanced directives . List of other physicians . Hospitalizations, surgeries, and ER visits in previous 12 months . Vitals . Screenings to include cognitive, depression, and falls . Referrals and appointments  In addition, I have reviewed and discussed with patient certain preventive protocols, quality metrics, and best practice recommendations. A written personalized care plan for preventive services as well as general preventive health recommendations were provided to patient.     Sheral Flow, LPN   [id number]   Nurse Notes:  Medications reviewed with patient; no opioid use noted.

## 2021-04-19 NOTE — Progress Notes (Signed)
[facility] Note  04/24/2021     CHIEF COMPLAINT Patient presents for Retina Evaluation   HISTORY OF PRESENT ILLNESS: Jane Montgomery is a 71 y.o. female who presents to the clinic today for:   HPI    Retina Evaluation    In both eyes.  This started 8 months ago.  Duration of 8 months.  Associated Symptoms Negative for Flashes, Floaters, Distortion, Blind Spot, Pain, Redness, Photophobia, Glare, Trauma, Scalp Tenderness, Jaw Claudication, Shoulder/Hip pain, Fever, Weight Loss and Fatigue.  Context:  near vision and reading.  Treatments tried include no treatments.  I, the attending physician,  performed the HPI with the patient and updated documentation appropriately.          Comments    Patient referred for evaluation of ARMD OU. Patient has noticed more difficulty reading for the past 8 months or so. No changes noticed in amsler grid at home. Patient on AREDS 2 vitamins for at least the past 10 years. 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 Kathlen Mody. Patient also has positive history of glaucoma, has had lasers OU for IOP control. Recent changes in VF tests at Dr. Kathleen Argue office. No gtts taken per patient. Patient is diabetic (diet-control). Hasn't checked BS recently. Last a1c was 5.9, checked 3-4 months ago.       Last edited by Bernarda Caffey, MD on 04/24/2021  1:37 PM. (History)    pt is here on the referral of Dr. 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 Zadie Rhine for macular degeneraton  Referring physician: Hortencia Pilar, MD [place],  [place] [address]  HISTORICAL INFORMATION:   Selected notes from the MEDICAL RECORD NUMBER Referred by Dr.  Kathlen Mody for eval of GA paracentral mac   CURRENT MEDICATIONS: No current outpatient medications on file. (Ophthalmic Drugs)   No current facility-administered medications for this visit. (Ophthalmic Drugs)   Current Outpatient Medications (Other)  Medication Sig  . amLODipine (NORVASC) 5 MG tablet TAKE 1 TABLET(5 MG) BY MOUTH EVERY MORNING  . aspirin 81 MG tablet Take 81 mg by mouth daily.  . benazepril (LOTENSIN) 20 MG tablet TAKE 1 TABLET(20 MG) BY MOUTH DAILY  . Biotin 7500 MCG TABS Take 1 tablet by mouth daily.  . cholecalciferol (VITAMIN D) 1000 units tablet Take 5,000 Units by mouth daily.  Marland Kitchen levocetirizine (XYZAL) 5 MG tablet Take 1 tablet by mouth daily.  . montelukast (SINGULAIR) 10 MG tablet Take 10 mg by mouth at bedtime.  . Multiple Vitamins-Minerals (ICAPS PO) Take 1 tablet by mouth 2 (two) times daily.  . pravastatin (PRAVACHOL) 40 MG tablet TAKE 1 TABLET(40 MG) BY MOUTH AT BEDTIME  . sertraline (ZOLOFT) 100 MG tablet TAKE 1 TABLET(100 MG) BY MOUTH DAILY  . amoxicillin (AMOXIL) 500 MG tablet TK 4 TS PO 1 HOUR PRIOR TO APPOINTMENT (Patient not taking: Reported on 04/24/2021)  . Azelastine-Fluticasone 137-50 MCG/ACT SUSP Place into the nose. (Patient not taking: Reported on 04/24/2021)  . Coenzyme Q10 100 MG TABS Take 1 tablet by mouth daily. (Patient not taking: No sig reported)   No current facility-administered medications for this visit. (Other)  REVIEW OF SYSTEMS: ROS    Positive for: Musculoskeletal, Endocrine, Cardiovascular, Eyes   Negative for: Constitutional, Gastrointestinal, Neurological, Skin, Genitourinary, HENT, Respiratory, Psychiatric, Allergic/Imm, Heme/Lymph   Last edited by Roselee Nova D, COT on 04/24/2021  1:15 PM. (History)       ALLERGIES No Known Allergies  PAST MEDICAL HISTORY Past Medical History:  Diagnosis Date  . Allergy   . 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 Arthritis   . Benign positional vertigo    in LLDP  .  Cataract    removed bilaterally   . Chronic back pain    reason unknown   . Depression    takes Zoloft daily- hormone imbalance - not truly depression per pt   . Diarrhea   . Diverticulosis   . Gestational diabetes 1982, 1984   "pre"  . Gilbert syndrome   . Heart murmur   . Hyperlipidemia    takes Pravastatin daily  . Hypertension    takes Amlodipine daily as well as Benazepril  . Joint pain   . Macular degeneration    early stages and dry  . Nocturia   . Osteoporosis   . Shortness of breath dyspnea    allergy related and occasionally will take Claritin.  . Sleep apnea    has cpap  . Tremor, essential    Dr Erling Cruz   Past Surgical History:  Procedure Laterality Date  . CATARACT EXTRACTION Bilateral 2015  . CESAREAN SECTION     x 2  . COLONOSCOPY  [phone]   Negative, redundant colon; [facility]  . DILATION AND CURETTAGE OF UTERUS     Dr Irven [place]  . PLANTAR FASCIA SURGERY Bilateral   . TONSILLECTOMY    . TOTAL SHOULDER ARTHROPLASTY Right 10/13/2015   Procedure: TOTAL SHOULDER ARTHROPLASTY;  Surgeon: [name], MD;  Location: [facility];  Service: Orthopedics;  Laterality: Right;  Right total shoulder arthroplasty    FAMILY HISTORY Family History  Problem Relation Age of Onset  . Hypertension Father   . Heart attack Father 81       CABG   . Colon cancer Mother 30  . Macular degeneration Mother   . Hypertension Mother   . Seizures Mother        ? etiology (preceded CVA)  . Stroke Mother 82  . Osteoporosis Mother   . Myelodysplastic syndrome Mother   . Colon cancer Maternal Grandmother   . Colon cancer Maternal Aunt   . Colon cancer Other        2 Maternal Great Aunts  . Heart attack Paternal Grandfather        > 55  . Heart attack Paternal Aunt 38  . Leukemia Paternal Grandmother   . Diabetes Neg Hx   . Colon polyps Neg Hx   . Esophageal cancer Neg Hx   . Rectal cancer Neg Hx   . Stomach cancer Neg Hx     SOCIAL HISTORY Social History    Tobacco Use  . Smoking status: Never Smoker  . Smokeless tobacco: Never Used  Vaping Use  . Vaping Use: Never used  Substance Use Topics  . Alcohol use: No  . Drug use: No         OPHTHALMIC EXAM:  Base Eye Exam    Visual Acuity (Snellen - Linear)      Right Left   Dist cc 20/25 20/30   Dist ph cc NI NI   Correction: Glasses  Tonometry (Tonopen, 1:30 PM)      Right Left   Pressure 16 18       Pupils      Dark Light Shape React APD   Right 3 2 Round Sluggish None   Left 4 3 Round Brisk None  Hx Horner's       Visual Fields (Counting fingers)      Left Right    Full Full       Extraocular Movement      Right Left    Full, Ortho Full, Ortho       Neuro/Psych    Oriented x3: Yes   Mood/Affect: Normal       Dilation    Both eyes: 1.0% Mydriacyl, 2.5% Phenylephrine @ 1:30 PM        Slit Lamp and Fundus Exam    Slit Lamp Exam      Right Left   Lids/Lashes Dermatochalasis - upper lid Dermatochalasis - upper lid   Conjunctiva/Sclera White and quiet White and quiet   Cornea arcus, well healed cataract wounds arcus, well healed cataract wounds   Anterior Chamber Deep and quiet Deep and quiet   Iris Round and dilated Round and dilated, +PPM   Lens PC IOL in good position with open PC PC IOL in good position    Vitreous Vitreous syneresis, Posterior vitreous detachment Vitreous syneresis       Fundus Exam      Right Left   Disc mild Pallor, Sharp rim, mild PPA mild Pallor, Sharp rim, mild PPA   C/D Ratio 0.5 0.6   Macula Flat, Blunted foveal reflex, RPE mottling and clumping, Drusen, focal round area of atrophy IN to fovea (about 0.6DD), No heme or edema Flat, Blunted foveal reflex, RPE mottling and clumping, No heme or edema   Vessels attenuated attenuated, mild tortuousity   Periphery Attached, scattered pavingstone degeneration, mild reticular degeneration Attached, scattered pavingstone degeneration, mild reticular degeneration         Refraction    Wearing Rx      Sphere Cylinder Axis Add   Right -2.50 +0.75 003 +2.50   Left -0.50 +0.50 163 +2.50       Manifest Refraction      Sphere Cylinder Axis Dist VA   Right -2.00 +1.00 005 20/25-2   Left +0.50 +0.75 155 20/30          IMAGING AND PROCEDURES  Imaging and Procedures for 04/24/2021  OCT, Retina - OU - Both Eyes       Right Eye Quality was good. Central Foveal Thickness: 286. Progression has no prior data. Findings include normal foveal contour, no IRF, no SRF, outer retinal atrophy, retinal drusen  (Diffuse ORA, focal patch of severe thinning IN mac).   Left Eye Quality was good. Central Foveal Thickness: 329. Progression has no prior data. Findings include abnormal foveal contour, intraretinal fluid, no SRF, outer retinal atrophy, retinal drusen .   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 CME - cystoid macular edema. PED - pigment epithelial detachment. IRF - intraretinal fluid. SRF - subretinal fluid. EZ - ellipsoid zone. ERM - epiretinal membrane. ORA - outer retinal atrophy. ORT - outer retinal tubulation. SRHM - subretinal hyper-reflective material. IRHM - intraretinal hyper-reflective material        Fluorescein Angiography Optos (Transit OS)  Right Eye   Progression has no prior data. Early phase findings include window defect, staining. Mid/Late phase findings include window defect, staining (No CNV).   Left Eye   Progression has no prior data. Early phase findings include staining, window defect. Mid/Late phase findings include staining, window defect (No CNV).   Notes **Images stored on drive**  Impression: No CNV OU +staining and window defect OU                ASSESSMENT/PLAN:    ICD-10-CM   1. Intermediate stage nonexudative age-related macular  degeneration of both eyes  H35.3132   2. Retinal edema  H35.81 OCT, Retina - OU - Both Eyes    Fluorescein Angiography Optos (Transit OS)  3. 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 Essential hypertension  I10   5. Hypertensive retinopathy of both eyes  H35.033   6. Pseudophakia, both eyes  Z96.1     1-3. Age related macular degeneration, non-exudative, both eyes vs toxic maculopathy OU  - The incidence, anatomy, and pathology of dry AMD, risk of progression, and the AREDS and AREDS 2 study including smoking risks discussed with patient.  - Recommend amsler grid monitoring  - OCT shows drusen and +ORA OU  - FA 4.25.22 shows staining and window defects, but no CNV OU; no vasculitis  - Optos FAF shows circumscribed hyper and hypoautofluorescence in posterior pole around macula and disc -- very similar in appearance to Elmiron-associated macular toxicity  - pt denies history of Elmiron use and has no history of interstitial cystitis  - differential includes retinal dystrophy / inherited retinal disease, other  - discussed findings, prognosis (BCVA 20/25 OD, 20/30 OS)  - no ophthalmic intervention indicated or recommended at this time -- monitor  - f/u 3-4 months, DFE, OCT  4,5. Hypertensive retinopathy OU - discussed importance of tight BP control - monitor  6. Pseudophakia OU  - s/p CE/IOL OU  - IOsL in good position, doing well  - monitor      Ophthalmic Meds Ordered this visit:  No orders of the defined types were placed in this encounter.      Return for f/u 3-4 months, non-exu ARMD OU, DFE, OCT.  There are no Patient Instructions on file for this visit.   Explained the diagnoses, plan, and follow up with the patient and they expressed understanding.  Patient expressed understanding of the importance of proper follow up care.   This document serves as a record of services personally performed by Gardiner Sleeper, MD, PhD. It was created on their behalf by Estill Bakes, COT an ophthalmic technician. The creation of this record is the provider's dictation and/or activities during the visit.    Electronically signed by: Estill Bakes, COT 4.20.22 @ 4:55 PM   This document serves as a record of services personally performed by Gardiner Sleeper, MD, PhD. It was created on their behalf by San Jetty. Owens Shark, OA an ophthalmic technician. The creation of this record is the provider's dictation and/or activities during the visit.    Electronically signed by: San Jetty. Owens Shark, [place] 04.25.2022 4:55 PM   Gardiner Sleeper, M.D., Ph.D. Diseases & Surgery of the Retina and Vitreous [facility]  I have reviewed the above documentation for accuracy and completeness, and I agree with the above. Gardiner Sleeper, M.D., Ph.D. 04/24/21 4:55 PM  Abbreviations: M myopia (nearsighted); A astigmatism; H hyperopia (farsighted); P presbyopia; Mrx spectacle prescription;  CTL contact lenses; OD right eye; OS left eye; OU both eyes  XT exotropia; ET esotropia; PEK punctate epithelial keratitis; PEE punctate epithelial erosions; DES dry eye syndrome; MGD meibomian gland dysfunction; ATs artificial tears; PFAT's preservative free artificial tears; Hilton nuclear sclerotic cataract; PSC posterior subcapsular cataract; ERM epi-retinal membrane; PVD posterior vitreous detachment; RD retinal detachment; DM diabetes mellitus; DR diabetic retinopathy; NPDR non-proliferative diabetic retinopathy; PDR proliferative diabetic retinopathy; CSME clinically significant macular edema; DME diabetic macular edema; dbh dot blot hemorrhages; CWS cotton wool spot; POAG primary open angle glaucoma; C/D cup-to-disc ratio; HVF humphrey visual field; GVF goldmann visual field; OCT optical coherence tomography; IOP intraocular pressure; BRVO Branch retinal vein occlusion; CRVO central retinal vein occlusion; CRAO central retinal artery occlusion; BRAO branch retinal artery occlusion; RT retinal tear;  SB scleral buckle; PPV pars plana vitrectomy; VH Vitreous hemorrhage; PRP panretinal laser photocoagulation; IVK intravitreal kenalog; VMT vitreomacular traction; MH Macular hole;  NVD neovascularization of the disc; NVE neovascularization elsewhere; AREDS age related eye disease study; ARMD age related macular degeneration; POAG primary open angle glaucoma; EBMD epithelial/anterior basement membrane dystrophy; ACIOL anterior chamber intraocular lens; IOL intraocular lens; PCIOL posterior chamber intraocular lens; Phaco/IOL phacoemulsification with intraocular lens placement; Enoch photorefractive keratectomy; LASIK laser assisted in situ keratomileusis; HTN hypertension; DM diabetes mellitus; COPD chronic obstructive pulmonary disease

## 2021-04-24 ENCOUNTER — Other Ambulatory Visit: Payer: Self-pay

## 2021-04-24 ENCOUNTER — Encounter (INDEPENDENT_AMBULATORY_CARE_PROVIDER_SITE_OTHER): Payer: Self-pay | Admitting: Ophthalmology

## 2021-04-24 ENCOUNTER — Ambulatory Visit (INDEPENDENT_AMBULATORY_CARE_PROVIDER_SITE_OTHER): Payer: Medicare Other | Admitting: Ophthalmology

## 2021-04-24 DIAGNOSIS — H35033 Hypertensive retinopathy, bilateral: Secondary | ICD-10-CM

## 2021-04-24 DIAGNOSIS — I10 Essential (primary) hypertension: Secondary | ICD-10-CM | POA: Diagnosis not present

## 2021-04-24 DIAGNOSIS — H35383 Toxic maculopathy, bilateral: Secondary | ICD-10-CM | POA: Diagnosis not present

## 2021-04-24 DIAGNOSIS — H3581 Retinal edema: Secondary | ICD-10-CM

## 2021-04-24 DIAGNOSIS — H353132 Nonexudative age-related macular degeneration, bilateral, intermediate dry stage: Secondary | ICD-10-CM

## 2021-04-24 DIAGNOSIS — Z961 Presence of intraocular lens: Secondary | ICD-10-CM

## 2021-04-25 ENCOUNTER — Encounter: Payer: Self-pay | Admitting: Internal Medicine

## 2021-04-25 DIAGNOSIS — H35033 Hypertensive retinopathy, bilateral: Secondary | ICD-10-CM | POA: Insufficient documentation

## 2021-04-25 DIAGNOSIS — H353190 Nonexudative age-related macular degeneration, unspecified eye, stage unspecified: Secondary | ICD-10-CM | POA: Insufficient documentation

## 2021-05-22 ENCOUNTER — Other Ambulatory Visit: Payer: Self-pay | Admitting: Internal Medicine

## 2021-05-23 ENCOUNTER — Telehealth: Payer: Self-pay | Admitting: Internal Medicine

## 2021-05-23 NOTE — Progress Notes (Signed)
  Chronic Care Management   Note  05/23/2021 Name: Jane Montgomery MRN: [id number] DOB: March 24, 1950  Jane Montgomery is a 71 y.o. year old female who is a primary care patient of Burns, Claudina Lick, MD. I reached out to Reginia Forts by phone today in response to a referral sent by Jane Montgomery PCP, Binnie Rail, MD.   Jane Montgomery was given information about Chronic Care Management services today including:  1. CCM service includes personalized support from designated clinical staff supervised by her physician, including individualized plan of care and coordination with other care providers 2. 24/7 contact phone numbers for assistance for urgent and routine care needs. 3. Service will only be billed when office clinical staff spend 20 minutes or more in a month to coordinate care. 4. Only one practitioner may furnish and bill the service in a calendar month. 5. The patient may stop CCM services at any time (effective at the end of the month) by phone call to the office staff.   Patient agreed to services and verbal consent obtained.   Follow up plan:  [place]

## 2021-06-05 ENCOUNTER — Other Ambulatory Visit: Payer: Self-pay

## 2021-06-05 NOTE — Patient Instructions (Addendum)
    Blood work was ordered.      Medications changes include :   none     Please followup in 6 months  

## 2021-06-05 NOTE — Progress Notes (Signed)
Subjective:    Patient ID: Jane Montgomery, female    DOB: 10/23/1950, 71 y.o.   MRN: [id number]  HPI The patient is here for follow up of their chronic medical problems, including htn, DM, hld, depression  She is exercising minimally - some chair exercises.     Medications and allergies reviewed with patient and updated if appropriate.  Patient Active Problem List   Diagnosis Date Noted  . Hypertensive retinopathy of both eyes 04/25/2021  . Macular degeneration, age related, nonexudative 04/25/2021  . Systolic murmur 27/02/5008  . Vitamin D deficiency 10/06/2018  . Osteopenia 04/15/2017  . Diabetes mellitus without complication ([facility]) 38/18/2993  . Need for prophylactic antibiotic for dental work 03/06/2016  . 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 OSA (obstructive sleep apnea) 10/04/2015  . Allergic rhinitis 08/20/2014  . 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 Benign paroxysmal positional vertigo 08/19/2013  . Hyperlipidemia 09/09/2007  . DISORDER, DYSMETABOLIC SYNDROME X 71/69/6789  . Depression 09/09/2007  . Essential hypertension 09/09/2007    Current Outpatient Medications on File Prior to Visit  Medication Sig Dispense Refill  . amLODipine (NORVASC) 5 MG tablet TAKE 1 TABLET(5 MG) BY MOUTH EVERY MORNING 90 tablet 1  . aspirin 81 MG tablet Take 81 mg by mouth daily.    . benazepril (LOTENSIN) 20 MG tablet TAKE 1 TABLET(20 MG) BY MOUTH DAILY 90 tablet 1  . Biotin 7500 MCG TABS Take 1 tablet by mouth daily.    . cholecalciferol (VITAMIN D) 1000 units tablet Take 5,000 Units by mouth daily.    Marland Kitchen levocetirizine (XYZAL) 5 MG tablet Take 1 tablet by mouth daily.    . montelukast (SINGULAIR) 10 MG tablet Take 10 mg by mouth at bedtime.    . Multiple Vitamins-Minerals (ICAPS PO) Take 1 tablet by mouth 2 (two) times daily.    . pravastatin (PRAVACHOL) 40 MG tablet TAKE 1 TABLET(40 MG) BY MOUTH AT BEDTIME 90 tablet 1  . sertraline (ZOLOFT) 100 MG tablet TAKE 1 TABLET(100 MG) BY  MOUTH DAILY 90 tablet 1  . amoxicillin (AMOXIL) 500 MG tablet TK 4 TS PO 1 HOUR PRIOR TO APPOINTMENT (Patient not taking: No sig reported)  0   No current facility-administered medications on file prior to visit.    Past Medical History:  Diagnosis Date  . Allergy   . 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 Arthritis   . Benign positional vertigo    in LLDP  . Cataract    removed bilaterally   . Chronic back pain    reason unknown   . Depression    takes Zoloft daily- hormone imbalance - not truly depression per pt   . Diarrhea   . Diverticulosis   . Gestational diabetes 1982, 1984   "pre"  . Gilbert syndrome   . Heart murmur   . Hyperlipidemia    takes Pravastatin daily  . Hypertension    takes Amlodipine daily as well as Benazepril  . Joint pain   . Macular degeneration    early stages and dry  . Nocturia   . Osteoporosis   . Shortness of breath dyspnea    allergy related and occasionally will take Claritin.  . Sleep apnea    has cpap  . Tremor, essential    Dr Erling Cruz    Past Surgical History:  Procedure Laterality Date  . CATARACT EXTRACTION Bilateral 2015  . CESAREAN SECTION     x 2  .  COLONOSCOPY  [phone]   Negative, redundant colon; [facility]  . DILATION AND CURETTAGE OF UTERUS     Dr Irven [place]  . PLANTAR FASCIA SURGERY Bilateral   . TONSILLECTOMY    . TOTAL SHOULDER ARTHROPLASTY Right 10/13/2015   Procedure: TOTAL SHOULDER ARTHROPLASTY;  Surgeon: [name], MD;  Location: [facility];  Service: Orthopedics;  Laterality: Right;  Right total shoulder arthroplasty    Social History   Socioeconomic History  . Marital status: Married    Spouse name: Not on file  . Number of children: 2  . Years of education: Not on file  . Highest education level: Not on file  Occupational History  . Not on file  Tobacco Use  . Smoking status: Never Smoker  . Smokeless tobacco: Never Used  Vaping Use  . Vaping Use: Never used  Substance and Sexual  Activity  . Alcohol use: No  . Drug use: No  . Sexual activity: Yes    Birth control/protection: Post-menopausal  Other Topics Concern  . Not on file  Social History Narrative  . Not on file   Social Determinants of Health   Financial Resource Strain: Low Risk   . Difficulty of Paying Living Expenses: Not hard at all  Food Insecurity: No Food Insecurity  . Worried About Charity fundraiser in the Last Year: Never true  . Ran Out of Food in the Last Year: Never true  Transportation Needs: No Transportation Needs  . Lack of Transportation (Medical): No  . Lack of Transportation (Non-Medical): No  Physical Activity: Sufficiently Active  . Days of Exercise per Week: 5 days  . Minutes of Exercise per Session: 30 min  Stress: No Stress Concern Present  . Feeling of Stress : Not at all  Social Connections: Moderately Integrated  . Frequency of Communication with Friends and Family: More than three times a week  . Frequency of Social Gatherings with Friends and Family: Once a week  . Attends Religious Services: 1 to 4 times per year  . Active Member of Clubs or Organizations: No  . Attends Archivist Meetings: 1 to 4 times per year  . Marital Status: Widowed    Family History  Problem Relation Age of Onset  . Hypertension Father   . Heart attack Father 86       CABG   . Colon cancer Mother 60  . Macular degeneration Mother   . Hypertension Mother   . Seizures Mother        ? etiology (preceded CVA)  . Stroke Mother 108  . Osteoporosis Mother   . Myelodysplastic syndrome Mother   . Colon cancer Maternal Grandmother   . Colon cancer Maternal Aunt   . Colon cancer Other        2 Maternal Great Aunts  . Heart attack Paternal Grandfather        > 55  . Heart attack Paternal Aunt 67  . Leukemia Paternal Grandmother   . Diabetes Neg Hx   . Colon polyps Neg Hx   . Esophageal cancer Neg Hx   . Rectal cancer Neg Hx   . Stomach cancer Neg Hx     Review of Systems   Constitutional: Negative for chills and fever.  Respiratory: Positive for shortness of breath (occ at night when she lays down ). Negative for cough and wheezing.   Cardiovascular: Negative for chest pain, palpitations and leg swelling.  Neurological: Negative for light-headedness and headaches.  Objective:   Vitals:   06/06/21 0958  BP: 130/74  Pulse: 67  Temp: 98.4 F (36.9 C)  SpO2: 97%   BP Readings from Last 3 Encounters:  06/06/21 130/74  02/28/21 128/70  12/06/20 140/78   Wt Readings from Last 3 Encounters:  06/06/21 249 lb (112.9 kg)  02/28/21 255 lb 9.6 oz (115.9 kg)  12/06/20 253 lb (114.8 kg)   Body mass index is 45.54 kg/m.   Physical Exam    Constitutional: Appears well-developed and well-nourished. No distress.  HENT:  Head: Normocephalic and atraumatic.  Neck: Neck supple. No tracheal deviation present. No thyromegaly present.  No cervical lymphadenopathy Cardiovascular: Normal rate, regular rhythm and normal heart sounds.   No murmur heard. No carotid bruit .  No edema Pulmonary/Chest: Effort normal and breath sounds normal. No respiratory distress. No has no wheezes. No rales.  Skin: Skin is warm and dry. Not diaphoretic.  Psychiatric: Normal mood and affect. Behavior is normal.      Assessment & Plan:    See Problem List for Assessment and Plan of chronic medical problems.    This visit occurred during the SARS-CoV-2 public health emergency.  Safety protocols were in place, including screening questions prior to the visit, additional usage of staff PPE, and extensive cleaning of exam room while observing appropriate contact time as indicated for disinfecting solutions.

## 2021-06-06 ENCOUNTER — Encounter: Payer: Self-pay | Admitting: Internal Medicine

## 2021-06-06 ENCOUNTER — Ambulatory Visit (INDEPENDENT_AMBULATORY_CARE_PROVIDER_SITE_OTHER): Payer: Medicare Other | Admitting: Internal Medicine

## 2021-06-06 VITALS — BP 130/74 | HR 67 | Temp 98.4°F | Ht 62.0 in | Wt 249.0 lb

## 2021-06-06 DIAGNOSIS — M85852 Other specified disorders of bone density and structure, left thigh: Secondary | ICD-10-CM

## 2021-06-06 DIAGNOSIS — F3289 Other specified depressive episodes: Secondary | ICD-10-CM | POA: Diagnosis not present

## 2021-06-06 DIAGNOSIS — E119 Type 2 diabetes mellitus without complications: Secondary | ICD-10-CM

## 2021-06-06 DIAGNOSIS — E7849 Other hyperlipidemia: Secondary | ICD-10-CM

## 2021-06-06 DIAGNOSIS — M85851 Other specified disorders of bone density and structure, right thigh: Secondary | ICD-10-CM

## 2021-06-06 DIAGNOSIS — I10 Essential (primary) hypertension: Secondary | ICD-10-CM | POA: Diagnosis not present

## 2021-06-06 LAB — COMPREHENSIVE METABOLIC PANEL
ALT: 35 U/L (ref 0–35)
AST: 22 U/L (ref 0–37)
Albumin: 4.8 g/dL (ref 3.5–5.2)
Alkaline Phosphatase: 124 U/L — ABNORMAL HIGH (ref 39–117)
BUN: 22 mg/dL (ref 6–23)
CO2: 28 mEq/L (ref 19–32)
Calcium: 10.3 mg/dL (ref 8.4–10.5)
Chloride: 99 mEq/L (ref 96–112)
Creatinine, Ser: 0.57 mg/dL (ref 0.40–1.20)
GFR: 92.79 mL/min (ref >60.00)
Glucose, Bld: 107 mg/dL — ABNORMAL HIGH (ref 70–99)
Potassium: 4.9 mEq/L (ref 3.5–5.1)
Sodium: 140 mEq/L (ref 135–145)
Total Bilirubin: 1.1 mg/dL (ref 0.2–1.2)
Total Protein: 7.9 g/dL (ref 6.0–8.3)

## 2021-06-06 LAB — LIPID PANEL
Cholesterol: 236 mg/dL — ABNORMAL HIGH (ref 0–200)
HDL: 63.8 mg/dL (ref >39.00)
NonHDL: 172
Total CHOL/HDL Ratio: 4
Triglycerides: 295 mg/dL — ABNORMAL HIGH (ref 0.0–149.0)
VLDL: 59 mg/dL — ABNORMAL HIGH (ref 0.0–40.0)

## 2021-06-06 LAB — LDL CHOLESTEROL, DIRECT: Direct LDL: 126 mg/dL

## 2021-06-06 LAB — HEMOGLOBIN A1C: Hgb A1c MFr Bld: 6.2 % (ref 4.6–6.5)

## 2021-06-06 NOTE — Assessment & Plan Note (Signed)
Chronic Controlled, stable Continue  sertraline 100 mg daily 

## 2021-06-06 NOTE — Assessment & Plan Note (Signed)
Chronic BP well controlled Continue amlodipine 5 mg daily, benazepril 20 mg daily cmp

## 2021-06-06 NOTE — Assessment & Plan Note (Signed)
Chronic dexa due - ordered Taking MVI Exercising minimally

## 2021-06-06 NOTE — Assessment & Plan Note (Signed)
Chronic Check lipid panel  Continue pravastattin 40 mg daily Regular exercise and healthy diet encouraged

## 2021-06-06 NOTE — Assessment & Plan Note (Signed)
Chronic Lab Results  Component Value Date   HGBA1C 5.9 12/06/2020   Controlled with diet Check a1c

## 2021-06-07 ENCOUNTER — Ambulatory Visit (INDEPENDENT_AMBULATORY_CARE_PROVIDER_SITE_OTHER)
Admission: RE | Admit: 2021-06-07 | Discharge: 2021-06-07 | Disposition: A | Discharge status: Home / Self Care | Payer: Medicare Other | Source: Ambulatory Visit | Attending: Internal Medicine | Admitting: Internal Medicine

## 2021-06-07 ENCOUNTER — Other Ambulatory Visit: Payer: Self-pay

## 2021-06-07 DIAGNOSIS — M85852 Other specified disorders of bone density and structure, left thigh: Secondary | ICD-10-CM

## 2021-06-07 DIAGNOSIS — M85851 Other specified disorders of bone density and structure, right thigh: Secondary | ICD-10-CM | POA: Diagnosis not present

## 2021-06-08 ENCOUNTER — Encounter: Payer: Self-pay | Admitting: Internal Medicine

## 2021-06-27 NOTE — Progress Notes (Signed)
Chronic Care Management Pharmacy Note  06/28/2021 Name:  Jane Montgomery MRN:  [id number] DOB:  October 18, 1950  Summary: - Reviewed DEXA results with patient, explained to patient meaning of results / osteoporosis and risks associated with it.  Patient reports to starting calcium supplementation as directed by PCP since DEXA scan - Reviewed cholesterol results with patient as well as goal LDL  Recommendations/Changes made from today's visit: - Recommending for patient to start alendronate 14m weekly for osteoporosis  - Recommending for patient to increase pravastatin to 89mdaily for cholesterol control / CVA prevention (patient has just filled 90 day supply of medication) reasonable to trial increased dose, should LDL remain elevated on next lipid panel could trial high intensity statin   Subjective: Jane Montgomery an [age] year old female who is a primary patient of Burns, StClaudina LickMD.  The CCM team was consulted for assistance with disease management and care coordination needs.    Engaged with patient face to face for initial visit in response to provider referral for pharmacy case management and/or care coordination services.   Consent to Services:  The patient was given the following information about Chronic Care Management services today, agreed to services, and gave verbal consent: 1. CCM service includes personalized support from designated clinical staff supervised by the primary care provider, including individualized plan of care and coordination with other care providers 2. 24/7 contact phone numbers for assistance for urgent and routine care needs. 3. Service will only be billed when office clinical staff spend 20 minutes or more in a month to coordinate care. 4. Only one practitioner may furnish and bill the service in a calendar month. 5.The patient may stop CCM services at any time (effective at the end of the month) by phone call to the office staff. 6. The patient will be  responsible for cost sharing (co-pay) of up to 20% of the service fee (after annual deductible is met). Patient agreed to services and consent obtained.  Patient Care Team: BuBinnie RailMD as PCP - General (Internal Medicine) HeMonna FamMD as Consulting Physician (Ophthalmology) SzDelice BisonaDarnelle MaffucciRPTria [facility] [place] Pharmacist (Pharmacist)  Recent office visits: 06/06/2021 - PCP visit - no changes at this time, patient stable  12/06/2020 - PCP visit - preventive healthcare screening - no changes at this time   Recent consult visits: 04/24/2021 - Dr. ZaCoralyn Pear ophthalmology - retina evaluation   Hospital visits: None in previous 6 months  Objective:  Lab Results  Component Value Date   CREATININE 0.57 06/06/2021   BUN 22 06/06/2021   GFR 92.79 06/06/2021   GFRNONAA >60 10/05/2015   GFRAA >60 10/05/2015   NA 140 06/06/2021   K 4.9 06/06/2021   CALCIUM 10.3 06/06/2021   CO2 28 06/06/2021   GLUCOSE 107 (H) 06/06/2021    Lab Results  Component Value Date/Time   HGBA1C 6.2 06/06/2021 10:30 AM   HGBA1C 5.9 12/06/2020 10:04 AM   GFR 92.79 06/06/2021 10:30 AM   GFR 95.65 12/06/2020 10:04 AM   MICROALBUR 8.3 (H) 08/22/2015 11:27 AM   MICROALBUR 0.8 04/08/2009 10:49 AM    Last diabetic Eye exam:  Lab Results  Component Value Date/Time   HMDIABEYEEXA No Retinopathy 10/06/2020 12:00 AM    Last diabetic Foot exam:  No results found for: HMDIABFOOTEX   Lab Results  Component Value Date   CHOL 236 (H) 06/06/2021   HDL 63.80 06/06/2021   LDLCALC 106 (H) 06/03/2020  LDLDIRECT 126.0 06/06/2021   TRIG 295.0 (H) 06/06/2021   CHOLHDL 4 06/06/2021    Hepatic Function Latest Ref Rng & Units 06/06/2021 12/06/2020 06/03/2020  Total Protein 6.0 - 8.3 g/dL 7.9 7.4 7.4  Albumin 3.5 - 5.2 g/dL 4.8 4.5 4.5  AST 0 - 37 U/L _0 ALT 0 - 35 U/L 35 35 33  Alk Phosphatase 39 - 117 U/L 124(H) 132(H) 122(H)  Total Bilirubin 0.2 - 1.2 mg/dL 1.1 0.9 1.1  Bilirubin, Direct 0.0 - 0.3 mg/dL - - -     Lab Results  Component Value Date/Time   TSH 1.23 12/06/2020 10:04 AM   TSH 1.92 06/04/2019 08:45 AM    CBC Latest Ref Rng & Units 12/06/2020 06/04/2019 04/04/2018  WBC 4.0 - 10.5 K/uL 5.3 5.3 6.7  Hemoglobin 12.0 - 15.0 g/dL 13.1 13.7 13.9  Hematocrit 36.0 - 46.0 % 40.2 41.8 41.7  Platelets 150.0 - 400.0 K/uL 186.0 183.0 192.0    Lab Results  Component Value Date/Time   VD25OH 36.26 12/06/2020 10:04 AM   VD25OH 36.64 06/04/2019 08:45 AM    Clinical ASCVD: No  The 10-year ASCVD risk score Mikey Bussing DC Jr., et al., 2013) is: 21.8%   Values used to calculate the score:     Age: 71 years     Sex: Female     Is Non-Hispanic African American: No     Diabetic: Yes     Tobacco smoker: No     Systolic Blood Pressure: 291 mmHg     Is BP treated: Yes     HDL Cholesterol: 63.8 mg/dL     Total Cholesterol: 236 mg/dL    Depression screen [facility] 2/9 06/28/2021 02/28/2021 12/06/2020  Decreased Interest 1 0 1  Down, Depressed, Hopeless 0 0 0  PHQ - 2 Score 1 0 1  Altered sleeping 0 - 0  Tired, decreased energy 2 - 1  Change in appetite 0 - 0  Feeling bad or failure about yourself  0 - 0  Trouble concentrating 0 - 0  Moving slowly or fidgety/restless 0 - 0  Suicidal thoughts 0 - 0  PHQ-9 Score 3 - 2  Difficult doing work/chores - - Not difficult at all    Social History   Tobacco Use  Smoking Status Never  Smokeless Tobacco Never   BP Readings from Last 3 Encounters:  06/06/21 130/74  02/28/21 128/70  12/06/20 140/78   Pulse Readings from Last 3 Encounters:  06/06/21 67  02/28/21 62  12/06/20 80   Wt Readings from Last 3 Encounters:  06/06/21 249 lb (112.9 kg)  02/28/21 255 lb 9.6 oz (115.9 kg)  12/06/20 253 lb (114.8 kg)   BMI Readings from Last 3 Encounters:  06/06/21 45.54 kg/m  02/28/21 46.75 kg/m  12/06/20 46.27 kg/m    Assessment/Interventions: Review of patient past medical history, allergies, medications, health status, including review of consultants reports,  laboratory and other test data, was performed as part of comprehensive evaluation and provision of chronic care management services.   SDOH:  (Social Determinants of Health) assessments and interventions performed: Yes  SDOH Screenings   Alcohol Screen: Low Risk    Last Alcohol Screening Score (AUDIT): 0  Depression (PHQ2-9): Low Risk    PHQ-2 Score: 3  Financial Resource Strain: Low Risk    Difficulty of Paying Living Expenses: Not hard at all  Food Insecurity: No Food Insecurity   Worried About Charity fundraiser in the Last Year: Never  true   Ran Out of Food in the Last Year: Never true  Housing: Low Risk    Last Housing Risk Score: 0  Physical Activity: Sufficiently Active   Days of Exercise per Week: 5 days   Minutes of Exercise per Session: 30 min  Social Connections: Moderately Integrated   Frequency of Communication with Friends and Family: More than three times a week   Frequency of Social Gatherings with Friends and Family: Once a week   Attends Religious Services: 1 to 4 times per year   Active Member of [employer] or Organizations: No   Attends Archivist Meetings: 1 to 4 times per year   Marital Status: Widowed  Stress: No Stress Concern Present   Feeling of Stress : Not at all  Tobacco Use: Low Risk    Smoking Tobacco Use: Never   Smokeless Tobacco Use: Never  Transportation Needs: No Transportation Needs   Lack of Transportation (Medical): No   Lack of Transportation (Non-Medical): No    CCM Care Plan  No Known Allergies  Medications Reviewed Today     Reviewed by Binnie Rail, MD (Physician) on 06/06/21 at 0751  Med List Status: <None>   Medication Order Taking? Sig Documenting Provider Last Dose Status Informant  amLODipine (NORVASC) 5 MG tablet [id number]  TAKE 1 TABLET(5 MG) BY MOUTH EVERY MORNING Burns, Claudina Lick, MD  Active   amoxicillin (AMOXIL) 500 MG tablet [id number]  TK 4 TS PO 1 HOUR PRIOR TO APPOINTMENT  Patient not taking: Reported on  04/24/2021   [provider]  Active   aspirin 81 MG tablet [id number]  Take 81 mg by mouth daily. [provider]  Active Self           Med Note Wyatt Portela, ANAIS   Wed Oct 05, 2015  6:06 PM) Stopped for colonoscopy coming up.   benazepril (LOTENSIN) 20 MG tablet [id number]  TAKE 1 TABLET(20 MG) BY MOUTH DAILY Binnie Rail, MD  Active   Biotin 7500 MCG TABS [id number]  Take 1 tablet by mouth daily. [provider]  Active Self  cholecalciferol (VITAMIN D) 1000 units tablet [id number]  Take 5,000 Units by mouth daily. [provider]  Active   levocetirizine (XYZAL) 5 MG tablet [id number]  Take 1 tablet by mouth daily. [provider]  Active   montelukast (SINGULAIR) 10 MG tablet [id number]  Take 10 mg by mouth at bedtime. [provider]  Active   Multiple Vitamins-Minerals (ICAPS PO) [id number]  Take 1 tablet by mouth 2 (two) times daily. [provider]  Active Self           Med Note Wyatt Portela, ANAIS   Wed Oct 05, 2015  6:07 PM) Stopped for colonoscopy coming up.   pravastatin (PRAVACHOL) 40 MG tablet [id number]  TAKE 1 TABLET(40 MG) BY MOUTH AT BEDTIME Binnie Rail, MD  Active   sertraline (ZOLOFT) 100 MG tablet [id number]  TAKE 1 TABLET(100 MG) BY MOUTH DAILY Binnie Rail, MD  Active             Patient Active Problem List   Diagnosis Date Noted   Hypertensive retinopathy of both eyes 04/25/2021   Macular degeneration, age related, nonexudative 54/98/2641   Systolic murmur 58/30/9407   Vitamin D deficiency 10/06/2018   Osteoporosis 04/15/2017   Diabetes mellitus without complication ([place]) 68/07/8109   Need for prophylactic antibiotic for dental work 03/06/2016   Osteoarthritis of right shoulder 10/13/2015   OSA (  obstructive sleep apnea) 10/04/2015   Allergic rhinitis 08/20/2014   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   Benign paroxysmal positional vertigo 08/19/2013   Hyperlipidemia 09/09/2007   DISORDER, DYSMETABOLIC SYNDROME X  68/02/2121   Depression 09/09/2007   Essential hypertension 09/09/2007    Immunization History  Administered Date(s) Administered   Fluad Quad(high Dose 65+) 12/06/2020   Influenza Split 01/23/2012, 09/08/2014   Influenza Whole 10/02/2008   Influenza, High Dose Seasonal PF 09/26/2017, 10/06/2018, 11/09/2019   Influenza-Unspecified 10/14/2013, 10/01/2015, 09/20/2016, 09/26/2017   Pneumococcal Conjugate-13 09/17/2012   Pneumococcal Polysaccharide-23 04/03/2017   Tdap 08/13/2012   Zoster, Live 10/14/2013    Conditions to be addressed/monitored:  Hypertension, Hyperlipidemia, Depression, and Osteoporosis  Care Plan : CCM Care Plan  Updates made by Tomasa Blase, [place] since 06/28/2021 12:00 AM     Problem: HTN, HLD, Osteoporosis, Depression, Allergic Rhinitis   Priority: High  Onset Date: 06/28/2021     Long-Range Goal: Disease Management   Start Date: 06/28/2021  Expected End Date: 12/28/2021  This Visit's Progress: On track  Priority: High  Note:   Current Barriers:  Unable to independently monitor therapeutic efficacy Unable to achieve control of LDL / osteoporosis    Pharmacist Clinical Goal(s):  Patient will achieve adherence to monitoring guidelines and medication adherence to achieve therapeutic efficacy achieve control of LDL as evidenced by next lipid panel maintain control of blood pressure as evidenced by continued office visit values in range and at home checks at goal  through collaboration with PharmD and provider.   Interventions: 1:1 collaboration with Binnie Rail, MD regarding development and update of comprehensive plan of care as evidenced by provider attestation and co-signature Inter-disciplinary care team collaboration (see longitudinal plan of care) Comprehensive medication review performed; medication list updated in electronic medical record  Hypertension (BP goal <140/90) -Controlled -Current treatment: Benazepril 18m daily  Amlodipine 537m daily  -Medications previously tried: n/a  -Current home readings: has not been checking at home  - Recent office visits:  -Current dietary habits: moderates sodium and caffeine intake  -Current exercise habits: exercises doing chair workouts AM and PM (mobility limited due to knee pain)  - Has been told she is a candidate for knee replacement but must lose weight first, patient notes to weight loss of 30 lbs since making changes to her diet -Denies hypotensive/hypertensive symptoms -Educated on BP goals and benefits of medications for prevention of heart attack, stroke and kidney damage; Daily salt intake goal < 2300 mg; Importance of home blood pressure monitoring; Proper BP monitoring technique; -Counseled to monitor BP at home at least every other week, document, and provide log at future appointments -Counseled on diet and exercise extensively Recommended to continue current medication  Hyperlipidemia: (LDL goal < 100) -Not ideally controlled - Last LDL 124 mg/dL (06/06/2021)  -Current treatment: Pravastatin 4054maily  Aspirin 67m38mily  -Medications previously tried: n/a  -Current dietary patterns: has been more strict with her diet (recent weight loss of 30 lbs) - moderates fried/ fatty food intake  -Current exercise habits: chair exercises morning and night  -Educated on Cholesterol goals;  Benefits of statin for ASCVD risk reduction; Importance of limiting foods high in cholesterol; Strategies to manage statin-induced myalgias; -Counseled on diet and exercise extensively Recommended for patient to increase pravastatin to 80mg78mly (has just filled 90 day supply) - if still not a goal LDL with next check could switch to high intensity statin   Depression(Goal: Mood control/ prevention of disease  progression) -Controlled -Current treatment: Sertraline 165m daily  -Medications previously tried/failed: n/a -PHQ9: 3 -Educated on Benefits of medication for symptom  control -Recommended to continue current medication  Osteoporosis (Goal prevention of fractures)  -Not ideally controlled -Last DEXA Scan: 06/07/2021   T-Score right femoral neck: -2.5  T-Score left femoral neck: -1.7  T-Score lumbar spine: -2.0  10-year probability of major osteoporotic fracture: 12%  10-year probability of hip fracture: 2.7%  -Patient is a candidate for pharmacologic treatment due to T-Score < -2.5 in femoral neck -Current treatment  Calcium chewable - 12535mdaily  Vit D3 - 3000 units daily  -Medications previously tried: n/a  -Recommend [phone] units of vitamin D daily. Recommend 1200 mg of calcium daily from dietary and supplemental sources. Counseled on oral bisphosphonate administration: take in the morning, 30 minutes prior to food with 6-8 oz of water. Do not lie down for at least 30 minutes after taking. Recommend weight-bearing and muscle strengthening exercises for building and maintaining bone density. -Recommended for patient to start alendronate 7020mnce weekly  -Educated patient about mechanism of action, how to administer medication, and possible side effects, patient agreeable to start   Allergic Rhinitis (Goal: Prevention/ Control of Allergies) -Controlled -Current treatment  Levocetirizine 5mg31mily Montelukast 10mg24mly  -Medications previously tried: azelastine, fluticasone nose spray, claritin D, claritin  -Recommended to continue current medication  Health Maintenance -Vaccine gaps: n/a -Current therapy:  Preservision AERDS 2 multivitamins - 1 tablet daily  Biotin 10,000 mcg daily  Move Free supplement - 1 tablet daily  Acetaminophen 325mg 57mtablets twice daily as needed  Centrum silver womens 50+ - 1 tablet daily  Probiotic - 1 capsule daily  CoQ10 - 100mg d110m  Amoxicillin 500mg - 43mblets prior to dental procedure (prophylaxis) -Educated on Herbal supplement research is limited and benefits usually cannot be proven Cost vs  benefit of each product must be carefully weighed by individual consumer Supplements may interfere with prescription drugs -Patient is satisfied with current therapy and denies issues -Recommended to continue current medication  Patient Goals/Self-Care Activities Patient will:  - take medications as prescribed check blood pressure every other week, document, and provide at future appointments - Prevent falls by ensuring proper footwear, using railings / stabilizing self when needed   Follow Up Plan: Telephone follow up appointment with care management team member scheduled for: The patient has been provided with contact information for the care management team and has been advised to call with any health related questions or concerns.        Medication Assistance: None required.  Patient affirms current coverage meets needs.  Compliance/Adherence/Medication fill history: Care Gaps: Foot Exam   Patient's preferred pharmacy is:  [id number] [address] [id number] -[place] [phone] box? Yes Pt endorses 100% compliance  Care Plan and Follow Up Patient Decision:  Patient agrees to Care Plan and Follow-up.  Plan: Telephone follow up appointment with care management team member scheduled for:  2 months  and The patient has been provided with contact information for the care management team and has been advised to call with any health related questions or concerns.   Kellin Bartling CTomasa Blase  Clinical Pharmacist, [place]

## 2021-06-28 ENCOUNTER — Other Ambulatory Visit: Payer: Self-pay

## 2021-06-28 ENCOUNTER — Ambulatory Visit (INDEPENDENT_AMBULATORY_CARE_PROVIDER_SITE_OTHER): Payer: Medicare Other

## 2021-06-28 DIAGNOSIS — J309 Allergic rhinitis, unspecified: Secondary | ICD-10-CM

## 2021-06-28 DIAGNOSIS — I10 Essential (primary) hypertension: Secondary | ICD-10-CM | POA: Diagnosis not present

## 2021-06-28 DIAGNOSIS — E7849 Other hyperlipidemia: Secondary | ICD-10-CM | POA: Diagnosis not present

## 2021-06-28 DIAGNOSIS — F3289 Other specified depressive episodes: Secondary | ICD-10-CM

## 2021-06-28 DIAGNOSIS — M810 Age-related osteoporosis without current pathological fracture: Secondary | ICD-10-CM | POA: Diagnosis not present

## 2021-06-28 NOTE — Patient Instructions (Signed)
Visit Information   PATIENT GOALS:   Goals Addressed             This Visit's Progress    Prevent Falls and Broken Bones-Osteoporosis       Timeframe:  Long-Range Goal Priority:  High Start Date:  06/28/2021                          Expected End Date: 12/28/2021                      Follow Up Date 08/28/2021   - always use handrails on the stairs - always wear shoes or slippers with non-slip sole - get at least 10 minutes of activity every day - keep cell phone with me always - make an emergency alert plan in case I fall    Why is this important?   When you fall, there are 3 things that control if a bone breaks or not.  These are the fall itself, how hard and the direction that you fall and how fragile your bones are.  Preventing falls is very important for you because of fragile bones.              Consent to CCM Services: Ms. Caicedo was given information about Chronic Care Management services today including:  CCM service includes personalized support from designated clinical staff supervised by her physician, including individualized plan of care and coordination with other care providers 24/7 contact phone numbers for assistance for urgent and routine care needs. Service will only be billed when office clinical staff spend 20 minutes or more in a month to coordinate care. Only one practitioner may furnish and bill the service in a calendar month. The patient may stop CCM services at any time (effective at the end of the month) by phone call to the office staff. The patient will be responsible for cost sharing (co-pay) of up to 20% of the service fee (after annual deductible is met).  Patient agreed to services and verbal consent obtained.   Patient verbalizes understanding of instructions provided today and agrees to view in [place].   Telephone follow up appointment with care management team member scheduled for: 2 months  The patient has been provided with  contact information for the care management team and has been advised to call with any health related questions or concerns.   Tomasa Blase, PharmD Clinical Pharmacist, [place]   CLINICAL CARE PLAN: Patient Care Plan: CCM Care Plan     Problem Identified: HTN, HLD, Osteoporosis, Depression, Allergic Rhinitis   Priority: High  Onset Date: 06/28/2021     Long-Range Goal: Disease Management   Start Date: 06/28/2021  Expected End Date: 12/28/2021  This Visit's Progress: On track  Priority: High  Note:   Current Barriers:  Unable to independently monitor therapeutic efficacy Unable to achieve control of LDL / osteoporosis    Pharmacist Clinical Goal(s):  Patient will achieve adherence to monitoring guidelines and medication adherence to achieve therapeutic efficacy achieve control of LDL as evidenced by next lipid panel maintain control of blood pressure as evidenced by continued office visit values in range and at home checks at goal  through collaboration with PharmD and provider.   Interventions: 1:1 collaboration with Binnie Rail, MD regarding development and update of comprehensive plan of care as evidenced by provider attestation and co-signature Inter-disciplinary care team collaboration (see longitudinal plan of care) Comprehensive  medication review performed; medication list updated in electronic medical record  Hypertension (BP goal <140/90) -Controlled -Current treatment: Benazepril 69m daily  Amlodipine 550mdaily  -Medications previously tried: n/a  -Current home readings: has not been checking at home  - Recent office visits:  -Current dietary habits: moderates sodium and caffeine intake  -Current exercise habits: exercises doing chair workouts AM and PM (mobility limited due to knee pain)  - Has been told she is a candidate for knee replacement but must lose weight first, patient notes to weight loss of 30 lbs since making changes to her  diet -Denies hypotensive/hypertensive symptoms -Educated on BP goals and benefits of medications for prevention of heart attack, stroke and kidney damage; Daily salt intake goal < 2300 mg; Importance of home blood pressure monitoring; Proper BP monitoring technique; -Counseled to monitor BP at home at least every other week, document, and provide log at future appointments -Counseled on diet and exercise extensively Recommended to continue current medication  Hyperlipidemia: (LDL goal < 100) -Not ideally controlled - Last LDL 124 mg/dL (06/06/2021)  -Current treatment: Pravastatin 4043maily  Aspirin 70m50mily  -Medications previously tried: n/a  -Current dietary patterns: has been more strict with her diet (recent weight loss of 30 lbs) - moderates fried/ fatty food intake  -Current exercise habits: chair exercises morning and night  -Educated on Cholesterol goals;  Benefits of statin for ASCVD risk reduction; Importance of limiting foods high in cholesterol; Strategies to manage statin-induced myalgias; -Counseled on diet and exercise extensively Recommended for patient to increase pravastatin to 80mg4mly (has just filled 90 day supply) - if still not a goal LDL with next check could switch to high intensity statin   Depression(Goal: Mood control/ prevention of disease progression) -Controlled -Current treatment: Sertraline 100mg 84my  -Medications previously tried/failed: n/a -PHQ9: 3 -Educated on Benefits of medication for symptom control -Recommended to continue current medication  Osteoporosis (Goal prevention of fractures)  -Not ideally controlled -Last DEXA Scan: 06/07/2021   T-Score right femoral neck: -2.5  T-Score left femoral neck: -1.7  T-Score lumbar spine: -2.0  10-year probability of major osteoporotic fracture: 12%  10-year probability of hip fracture: 2.7%  -Patient is a candidate for pharmacologic treatment due to T-Score < -2.5 in femoral neck -Current  treatment  Calcium chewable - 1250mg d27m  Vit D3 - 3000 units daily  -Medications previously tried: n/a  -Recommend [phone] units of vitamin D daily. Recommend 1200 mg of calcium daily from dietary and supplemental sources. Counseled on oral bisphosphonate administration: take in the morning, 30 minutes prior to food with 6-8 oz of water. Do not lie down for at least 30 minutes after taking. Recommend weight-bearing and muscle strengthening exercises for building and maintaining bone density. -Recommended for patient to start alendronate 70mg on10meekly  -Educated patient about mechanism of action, how to administer medication, and possible side effects, patient agreeable to start   Allergic Rhinitis (Goal: Prevention/ Control of Allergies) -Controlled -Current treatment  Levocetirizine 5mg dail51montelukast 10mg dail47mMedications previously tried: azelastine, fluticasone nose spray, claritin D, claritin  -Recommended to continue current medication  Health Maintenance -Vaccine gaps: n/a -Current therapy:  Preservision AERDS 2 multivitamins - 1 tablet daily  Biotin 10,000 mcg daily  Move Free supplement - 1 tablet daily  Acetaminophen 325mg - 2 t90mts twice daily as needed  Centrum silver womens 50+ - 1 tablet daily  Probiotic - 1 capsule daily  CoQ10 - 100mg daily 78mxicillin 500mg -67m  4 tablets prior to dental procedure (prophylaxis) -Educated on Herbal supplement research is limited and benefits usually cannot be proven Cost vs benefit of each product must be carefully weighed by individual consumer Supplements may interfere with prescription drugs -Patient is satisfied with current therapy and denies issues -Recommended to continue current medication  Patient Goals/Self-Care Activities Patient will:  - take medications as prescribed check blood pressure every other week, document, and provide at future appointments - Prevent falls by ensuring proper footwear, using  railings / stabilizing self when needed   Follow Up Plan: Telephone follow up appointment with care management team member scheduled for: The patient has been provided with contact information for the care management team and has been advised to call with any health related questions or concerns.

## 2021-06-29 ENCOUNTER — Other Ambulatory Visit: Payer: Self-pay | Admitting: Internal Medicine

## 2021-06-29 MED ORDER — ALENDRONATE SODIUM 70 MG PO TABS
70.0000 mg | ORAL_TABLET | ORAL | 11 refills | Status: DC
Start: 2021-06-29 — End: 2022-07-30

## 2021-06-29 MED ORDER — PRAVASTATIN SODIUM 80 MG PO TABS: 80.0000 mg | ORAL_TABLET | Freq: Every day | ORAL | 1 refills | Status: DC

## 2021-07-25 NOTE — Progress Notes (Signed)
[facility] Note  07/26/2021     CHIEF COMPLAINT Patient presents for Retina Follow Up   HISTORY OF PRESENT ILLNESS: Jane Montgomery is a 71 y.o. female who presents to the clinic today for:   HPI     Retina Follow Up   Patient presents with  Dry AMD.  In both eyes.  This started months ago.  Severity is moderate.  Duration of 3 months.  Since onset it is gradually worsening.  I, the attending physician,  performed the HPI with the patient and updated documentation appropriately.        Comments   71 y/o female pt here for 3 mo f/u for non-exu ARMD OU.  No change in [place] OU noticed.  Denies pain, FOL, floaters.  No gtts.      Last edited by Bernarda Caffey, MD on 07/28/2021 12:03 AM.    Pt states her vision is the same OU and has not noticed any changes. Pt reports mother had severe ARMD  Referring physician: Binnie Rail, MD [place],  [place] [id number]  HISTORICAL INFORMATION:   Selected notes from the MEDICAL RECORD NUMBER Referred by Dr. Kathlen Mody for eval of GA paracentral mac   CURRENT MEDICATIONS: No current outpatient medications on file. (Ophthalmic Drugs)   No current facility-administered medications for this visit. (Ophthalmic Drugs)   Current Outpatient Medications (Other)  Medication Sig   acetaminophen (TYLENOL) 325 MG tablet Take 650 mg by mouth every 6 (six) hours as needed.   alendronate (FOSAMAX) 70 MG tablet Take 1 tablet (70 mg total) by mouth every 7 (seven) days. Take with a full glass of water on an empty stomach.   amLODipine (NORVASC) 5 MG tablet TAKE 1 TABLET(5 MG) BY MOUTH EVERY MORNING   amoxicillin (AMOXIL) 500 MG tablet TK 4 TS PO 1 HOUR PRIOR TO APPOINTMENT   aspirin 81 MG tablet Take 81 mg by mouth daily.   Azelastine-Fluticasone 137-50 MCG/ACT SUSP 1 spray in each nostril   benazepril (LOTENSIN) 20 MG tablet TAKE 1 TABLET(20 MG) BY MOUTH DAILY   Biotin 10000 MCG TABS Take 1 tablet by mouth  daily.   calcium carbonate (OS-CAL) 1250 (500 Ca) MG chewable tablet Chew 1 tablet by mouth daily.   cholecalciferol (VITAMIN D) 1000 units tablet Take 3,000 Units by mouth daily.   Coenzyme Q10 (CO Q-10) 100 MG CAPS Take 1 capsule by mouth daily.   Coenzyme Q10 (COQ10) 50 MG CAPS See admin instructions.   Collagen-Boron-Hyaluronic Acid (MOVE FREE ULTRA JOINT HEALTH PO) Take 1 tablet by mouth daily.   levocetirizine (XYZAL) 5 MG tablet Take 1 tablet by mouth daily.   levocetirizine (XYZAL) 5 MG tablet 1 tablet in the evening   montelukast (SINGULAIR) 10 MG tablet Take 10 mg by mouth at bedtime.   montelukast (SINGULAIR) 10 MG tablet 1 tablet   Multiple Vitamins-Minerals (CENTRUM SILVER 50+WOMEN) TABS Take 1 tablet by mouth daily.   Multiple Vitamins-Minerals (PRESERVISION AREDS 2 PO) Take 1 capsule by mouth in the morning and at bedtime.   pravastatin (PRAVACHOL) 80 MG tablet Take 1 tablet (80 mg total) by mouth daily.   Probiotic Product (PROBIOTIC DAILY PO) Take 1 capsule by mouth daily.   sertraline (ZOLOFT) 100 MG tablet TAKE 1 TABLET(100 MG) BY MOUTH DAILY   triamcinolone ointment (KENALOG) 0.1 % apply sparingly to affected areas   No current facility-administered medications for this visit. (Other)  REVIEW OF SYSTEMS: ROS   Positive for: Gastrointestinal, Neurological, Musculoskeletal, Endocrine, Cardiovascular, Eyes, Respiratory Negative for: Constitutional, Skin, Genitourinary, HENT, Psychiatric, Allergic/Imm, Heme/Lymph Last edited by Matthew Folks, COA on 07/26/2021  1:02 PM.        ALLERGIES No Known Allergies  PAST MEDICAL HISTORY Past Medical History:  Diagnosis Date   Allergy    Anisocoria    evaluated by Dr Erling Cruz 2005   Arthritis    Benign positional vertigo    in LLDP   Chronic back pain    reason unknown    Depression    takes Zoloft daily- hormone imbalance - not truly depression per pt    Diarrhea    Diverticulosis    Gestational diabetes  1982, 1984   "pre"   Gilbert syndrome    Heart murmur    Hyperlipidemia    takes Pravastatin daily   Hypertension    takes Amlodipine daily as well as Benazepril   Hypertensive retinopathy    Joint pain    Macular degeneration    early stages and dry   Nocturia    Osteoporosis    Shortness of breath dyspnea    allergy related and occasionally will take Claritin.   Sleep apnea    has cpap   Tremor, essential    Dr Erling Cruz   Past Surgical History:  Procedure Laterality Date   CATARACT EXTRACTION Bilateral 12/31/2013   CESAREAN SECTION     x 2   COLONOSCOPY  [phone]   Negative, redundant colon; [facility]   DILATION AND CURETTAGE OF UTERUS     Dr Irven [place]   EYE SURGERY     PLANTAR FASCIA SURGERY Bilateral    TONSILLECTOMY     TOTAL SHOULDER ARTHROPLASTY Right 10/13/2015   Procedure: TOTAL SHOULDER ARTHROPLASTY;  Surgeon: [name], MD;  Location: [facility];  Service: Orthopedics;  Laterality: Right;  Right total shoulder arthroplasty    FAMILY HISTORY Family History  Problem Relation Age of Onset   Hypertension Father    Heart attack Father 63       CABG    Colon cancer Mother 81   Macular degeneration Mother    Hypertension Mother    Seizures Mother        ? etiology (preceded CVA)   Stroke Mother 84   Osteoporosis Mother    Myelodysplastic syndrome Mother    Colon cancer Maternal Grandmother    Colon cancer Maternal Aunt    Colon cancer Other        2 Maternal Great Aunts   Heart attack Paternal Grandfather        > 34   Heart attack Paternal Aunt 50   Leukemia Paternal Grandmother    Diabetes Neg Hx    Colon polyps Neg Hx    Esophageal cancer Neg Hx    Rectal cancer Neg Hx    Stomach cancer Neg Hx     SOCIAL HISTORY Social History   Tobacco Use   Smoking status: Never   Smokeless tobacco: Never  Vaping Use   Vaping Use: Never used  Substance Use Topics   Alcohol use: No   Drug use: No         OPHTHALMIC EXAM:  Base Eye Exam      Visual Acuity (Snellen - Linear)       Right Left   Dist cc 20/30 20/40   Dist ph cc NI NI    Correction: Glasses  Tonometry (Tonopen, 1:04 PM)       Right Left   Pressure 15 19         Pupils       Dark Light Shape React APD   Right 3 2 Round Slow None   Left 4 3 Round Brisk None         Visual Fields (Counting fingers)       Left Right    Full Full         Extraocular Movement       Right Left    Full, Ortho Full, Ortho         Neuro/Psych     Oriented x3: Yes   Mood/Affect: Normal         Dilation     Both eyes: 1.0% Mydriacyl, 2.5% Phenylephrine @ 1:04 PM           Slit Lamp and Fundus Exam     Slit Lamp Exam       Right Left   Lids/Lashes Dermatochalasis - upper lid Dermatochalasis - upper lid   Conjunctiva/Sclera White and quiet White and quiet   Cornea arcus, well healed cataract wounds arcus, well healed cataract wounds   Anterior Chamber Deep and quiet Deep and quiet   Iris Round and dilated Round and dilated, +PPM   Lens PC IOL in good position with open PC PC IOL in good position    Vitreous Vitreous syneresis, Posterior vitreous detachment Vitreous syneresis, Posterior vitreous detachment         Fundus Exam       Right Left   Disc mild Pallor, Sharp rim, mild PPA mild Pallor, Sharp rim, mild PPA   C/D Ratio 0.5 0.6   Macula Flat, Blunted foveal reflex, RPE mottling and clumping, Drusen, focal round area of atrophy IN to fovea (about 0.6DD), No heme or edema Flat, Blunted foveal reflex, RPE mottling and clumping, diffuse atrophy, No heme or edema   Vessels attenuated attenuated, mild tortuousity   Periphery Attached, scattered pavingstone degeneration, mild reticular degeneration Attached, scattered pavingstone degeneration, mild reticular degeneration            IMAGING AND PROCEDURES  Imaging and Procedures for 07/26/2021  OCT, Retina - OU - Both Eyes       Right Eye Quality was good. Central  Foveal Thickness: 285. Progression has been stable. Findings include normal foveal contour, no IRF, no SRF, outer retinal atrophy, retinal drusen (Diffuse ORA, focal patch of severe thinning IN mac--? progression of ORA).   Left Eye Quality was good. Central Foveal Thickness: 284. Progression has improved. Findings include intraretinal fluid, no SRF, outer retinal atrophy, retinal drusen , normal foveal contour (Interval improvement in central cystic changes).   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 CME - cystoid macular edema. PED - pigment epithelial detachment. IRF - intraretinal fluid. SRF - subretinal fluid. EZ - ellipsoid zone. ERM - epiretinal membrane. ORA - outer retinal atrophy. ORT - outer retinal tubulation. SRHM - subretinal hyper-reflective material. IRHM - intraretinal hyper-reflective material             ASSESSMENT/PLAN:    ICD-10-CM   1. Intermediate stage nonexudative age-related macular degeneration of both eyes  H35.3132     2. Retinal edema  H35.81 OCT, Retina - OU - Both Eyes  3. 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 Essential hypertension  I10     5. Hypertensive retinopathy of both eyes  H35.033     6. Pseudophakia, both eyes  Z96.1        1-3. Age related macular degeneration, non-exudative, both eyes vs toxic maculopathy OU  - The incidence, anatomy, and pathology of dry AMD, risk of progression, and the AREDS and AREDS 2 study including smoking risks discussed with patient.  - Recommend amsler grid monitoring  - OCT shows drusen and +ORA OU  - FA 4.25.22 shows staining and window defects, but no CNV OU; no vasculitis  - Optos FAF shows circumscribed hyper and hypoautofluorescence in posterior pole around macula and disc -- very similar in appearance to Elmiron-associated macular  toxicity  - pt denies history of Elmiron use and has no history of interstitial cystitis  - differential includes retinal dystrophy / inherited retinal disease, other  - discussed findings, prognosis (BCVA 20/30 OD, 20/40 OS)  - pt reports her mother had severe ARMD -"Mom was blind before she died)  - no ophthalmic intervention indicated or recommended at this time -- monitor  - f/u 3-4 months, DFE, OCT  4,5. Hypertensive retinopathy OU - discussed importance of tight BP control - monitor  6. Pseudophakia OU  - s/p CE/IOL OU  - IOsL in good position, doing well  - monitor  Ophthalmic Meds Ordered this visit:  No orders of the defined types were placed in this encounter.      Return in about 3 months (around 10/26/2021) for 3-4 months non exu ARMD OU, DFE/OCT.  There are no Patient Instructions on file for this visit.   Explained the diagnoses, plan, and follow up with the patient and they expressed understanding.  Patient expressed understanding of the importance of proper follow up care.   This document serves as a record of services personally performed by Gardiner Sleeper, MD, PhD. It was created on their behalf by Roselee Nova, COMT. The creation of this record is the provider's dictation and/or activities during the visit.  Electronically signed by: Roselee Nova, COMT 07/28/21 12:07 AM  This document serves as a record of services personally performed by Gardiner Sleeper, MD, PhD. It was created on their behalf by Leeann Must, [place], an ophthalmic technician. The creation of this record is the provider's dictation and/or activities during the visit.    Electronically signed by: Leeann Must, COA _0 @ 12:07 AM  Gardiner Sleeper, M.D., Ph.D. Diseases & Surgery of the Retina and Vitreous [facility]  I have reviewed the above documentation for accuracy and completeness, and I agree with the above. Gardiner Sleeper, M.D., Ph.D. 07/28/21 12:07  AM   Abbreviations: M myopia (nearsighted); A astigmatism; H hyperopia (farsighted); P presbyopia; Mrx spectacle prescription;  CTL contact lenses; OD right eye; OS left eye; OU both eyes  XT exotropia; ET esotropia; PEK punctate epithelial keratitis; PEE punctate epithelial erosions; DES dry eye syndrome; MGD meibomian gland dysfunction; ATs artificial tears; PFAT's preservative free artificial tears; [place] nuclear sclerotic cataract; PSC posterior subcapsular cataract; ERM epi-retinal membrane; PVD posterior vitreous detachment; RD retinal detachment; DM diabetes mellitus; DR diabetic retinopathy; NPDR non-proliferative diabetic retinopathy; PDR proliferative diabetic retinopathy; CSME clinically significant macular edema; DME diabetic macular edema; dbh dot blot hemorrhages; CWS cotton wool spot; POAG primary open angle glaucoma; C/D cup-to-disc ratio; HVF humphrey visual field; GVF goldmann visual field; OCT optical coherence tomography;  IOP intraocular pressure; BRVO Branch retinal vein occlusion; CRVO central retinal vein occlusion; CRAO central retinal artery occlusion; BRAO branch retinal artery occlusion; RT retinal tear; SB scleral buckle; PPV pars plana vitrectomy; VH Vitreous hemorrhage; PRP panretinal laser photocoagulation; IVK intravitreal kenalog; VMT vitreomacular traction; MH Macular hole;  NVD neovascularization of the disc; NVE neovascularization elsewhere; AREDS age related eye disease study; ARMD age related macular degeneration; POAG primary open angle glaucoma; EBMD epithelial/anterior basement membrane dystrophy; ACIOL anterior chamber intraocular lens; IOL intraocular lens; PCIOL posterior chamber intraocular lens; Phaco/IOL phacoemulsification with intraocular lens placement; [place] photorefractive keratectomy; LASIK laser assisted in situ keratomileusis; HTN hypertension; DM diabetes mellitus; COPD chronic obstructive pulmonary disease

## 2021-07-26 ENCOUNTER — Ambulatory Visit (INDEPENDENT_AMBULATORY_CARE_PROVIDER_SITE_OTHER): Payer: Medicare Other | Admitting: Ophthalmology

## 2021-07-26 ENCOUNTER — Other Ambulatory Visit: Payer: Self-pay

## 2021-07-26 ENCOUNTER — Encounter (INDEPENDENT_AMBULATORY_CARE_PROVIDER_SITE_OTHER): Payer: Self-pay | Admitting: Ophthalmology

## 2021-07-26 DIAGNOSIS — H353132 Nonexudative age-related macular degeneration, bilateral, intermediate dry stage: Secondary | ICD-10-CM | POA: Diagnosis not present

## 2021-07-26 DIAGNOSIS — H35383 Toxic maculopathy, bilateral: Secondary | ICD-10-CM | POA: Diagnosis not present

## 2021-07-26 DIAGNOSIS — I10 Essential (primary) hypertension: Secondary | ICD-10-CM | POA: Diagnosis not present

## 2021-07-26 DIAGNOSIS — H3581 Retinal edema: Secondary | ICD-10-CM | POA: Diagnosis not present

## 2021-07-26 DIAGNOSIS — H35033 Hypertensive retinopathy, bilateral: Secondary | ICD-10-CM

## 2021-07-26 DIAGNOSIS — Z961 Presence of intraocular lens: Secondary | ICD-10-CM

## 2021-07-28 ENCOUNTER — Encounter (INDEPENDENT_AMBULATORY_CARE_PROVIDER_SITE_OTHER): Payer: Self-pay | Admitting: Ophthalmology

## 2021-08-28 ENCOUNTER — Ambulatory Visit (INDEPENDENT_AMBULATORY_CARE_PROVIDER_SITE_OTHER): Payer: Medicare Other

## 2021-08-28 ENCOUNTER — Other Ambulatory Visit: Payer: Self-pay

## 2021-08-28 DIAGNOSIS — I10 Essential (primary) hypertension: Secondary | ICD-10-CM

## 2021-08-28 DIAGNOSIS — E7849 Other hyperlipidemia: Secondary | ICD-10-CM | POA: Diagnosis not present

## 2021-08-28 DIAGNOSIS — M810 Age-related osteoporosis without current pathological fracture: Secondary | ICD-10-CM

## 2021-08-28 NOTE — Progress Notes (Signed)
Chronic Care Management Pharmacy Note  08/28/2021 Name:  Jane Montgomery MRN:  [id number] DOB:  June 19, 1950  Summary: - Patient reports that she has been doing well since increasing pravastatin and starting alendronate, has no issues or concerns at this time   Recommendations/Changes made from today's visit: - Recommending for patient to continue current medications will plan to recheck lipid panel and LFTs with next PCP appointment 12/06/2021  Subjective: Jane Montgomery is an 71 y.o. year old female who is a primary patient of Burns, Claudina Lick, MD.  The CCM team was consulted for assistance with disease management and care coordination needs.    Engaged with patient by telephone for follow up visit in response to provider referral for pharmacy case management and/or care coordination services.   Consent to Services:  The patient was given the following information about Chronic Care Management services today, agreed to services, and gave verbal consent: 1. CCM service includes personalized support from designated clinical staff supervised by the primary care provider, including individualized plan of care and coordination with other care providers 2. 24/7 contact phone numbers for assistance for urgent and routine care needs. 3. Service will only be billed when office clinical staff spend 20 minutes or more in a month to coordinate care. 4. Only one practitioner may furnish and bill the service in a calendar month. 5.The patient may stop CCM services at any time (effective at the end of the month) by phone call to the office staff. 6. The patient will be responsible for cost sharing (co-pay) of up to 20% of the service fee (after annual deductible is met). Patient agreed to services and consent obtained.  Patient Care Team: Binnie Rail, MD as PCP - General (Internal Medicine) Monna Fam, MD as Consulting Physician (Ophthalmology) Tomasa Blase, [facility] as Pharmacist (Pharmacist)  Recent  office visits: No changes since last visit   Recent consult visits: 07/26/2021 - Dr. Coralyn Pear - (opthalmology) - 3 month follow up  Hospital visits: None in previous 6 months  Objective:  Lab Results  Component Value Date   CREATININE 0.57 06/06/2021   BUN 22 06/06/2021   GFR 92.79 06/06/2021   GFRNONAA >60 10/05/2015   GFRAA >60 10/05/2015   NA 140 06/06/2021   K 4.9 06/06/2021   CALCIUM 10.3 06/06/2021   CO2 28 06/06/2021   GLUCOSE 107 (H) 06/06/2021    Lab Results  Component Value Date/Time   HGBA1C 6.2 06/06/2021 10:30 AM   HGBA1C 5.9 12/06/2020 10:04 AM   GFR 92.79 06/06/2021 10:30 AM   GFR 95.65 12/06/2020 10:04 AM   MICROALBUR 8.3 (H) 08/22/2015 11:27 AM   MICROALBUR 0.8 04/08/2009 10:49 AM    Last diabetic Eye exam:  Lab Results  Component Value Date/Time   HMDIABEYEEXA No Retinopathy 10/06/2020 12:00 AM    Last diabetic Foot exam:  No results found for: HMDIABFOOTEX   Lab Results  Component Value Date   CHOL 236 (H) 06/06/2021   HDL 63.80 06/06/2021   LDLCALC 106 (H) 06/03/2020   LDLDIRECT 126.0 06/06/2021   TRIG 295.0 (H) 06/06/2021   CHOLHDL 4 06/06/2021    Hepatic Function Latest Ref Rng & Units 06/06/2021 12/06/2020 06/03/2020  Total Protein 6.0 - 8.3 g/dL 7.9 7.4 7.4  Albumin 3.5 - 5.2 g/dL 4.8 4.5 4.5  AST 0 - 37 U/L 22 23 24   ALT 0 - 35 U/L 35 35 33  Alk Phosphatase 39 - 117 U/L 124(H) 132(H) 122(H)  Total  Bilirubin 0.2 - 1.2 mg/dL 1.1 0.9 1.1  Bilirubin, Direct 0.0 - 0.3 mg/dL - - -    Lab Results  Component Value Date/Time   TSH 1.23 12/06/2020 10:04 AM   TSH 1.92 06/04/2019 08:45 AM    CBC Latest Ref Rng & Units 12/06/2020 06/04/2019 04/04/2018  WBC 4.0 - 10.5 K/uL 5.3 5.3 6.7  Hemoglobin 12.0 - 15.0 g/dL 13.1 13.7 13.9  Hematocrit 36.0 - 46.0 % 40.2 41.8 41.7  Platelets 150.0 - 400.0 K/uL 186.0 183.0 192.0    Lab Results  Component Value Date/Time   VD25OH 36.26 12/06/2020 10:04 AM   VD25OH 36.64 06/04/2019 08:45 AM    Clinical  ASCVD: No  The 10-year ASCVD risk score Mikey Bussing DC Jr., et al., 2013) is: 21.8%   Values used to calculate the score:     Age: 71 years     Sex: Female     Is Non-Hispanic African American: No     Diabetic: Yes     Tobacco smoker: No     Systolic Blood Pressure: 616 mmHg     Is BP treated: Yes     HDL Cholesterol: 63.8 mg/dL     Total Cholesterol: 236 mg/dL    Depression screen [facility] 2/9 06/28/2021 02/28/2021 12/06/2020  Decreased Interest 1 0 1  Down, Depressed, Hopeless 0 0 0  PHQ - 2 Score 1 0 1  Altered sleeping 0 - 0  Tired, decreased energy 2 - 1  Change in appetite 0 - 0  Feeling bad or failure about yourself  0 - 0  Trouble concentrating 0 - 0  Moving slowly or fidgety/restless 0 - 0  Suicidal thoughts 0 - 0  PHQ-9 Score 3 - 2  Difficult doing work/chores - - Not difficult at all    Social History   Tobacco Use  Smoking Status Never  Smokeless Tobacco Never   BP Readings from Last 3 Encounters:  06/06/21 130/74  02/28/21 128/70  12/06/20 140/78   Pulse Readings from Last 3 Encounters:  06/06/21 67  02/28/21 62  12/06/20 80   Wt Readings from Last 3 Encounters:  06/06/21 249 lb (112.9 kg)  02/28/21 255 lb 9.6 oz (115.9 kg)  12/06/20 253 lb (114.8 kg)   BMI Readings from Last 3 Encounters:  06/06/21 45.54 kg/m  02/28/21 46.75 kg/m  12/06/20 46.27 kg/m    Assessment/Interventions: Review of patient past medical history, allergies, medications, health status, including review of consultants reports, laboratory and other test data, was performed as part of comprehensive evaluation and provision of chronic care management services.   SDOH:  (Social Determinants of Health) assessments and interventions performed: Yes  SDOH Screenings   Alcohol Screen: Low Risk    Last Alcohol Screening Score (AUDIT): 0  Depression (PHQ2-9): Low Risk    PHQ-2 Score: 3  Financial Resource Strain: Low Risk    Difficulty of Paying Living Expenses: Not hard at all  Food  Insecurity: No Food Insecurity   Worried About Charity fundraiser in the Last Year: Never true   Ran Out of Food in the Last Year: Never true  Housing: Low Risk    Last Housing Risk Score: 0  Physical Activity: Sufficiently Active   Days of Exercise per Week: 5 days   Minutes of Exercise per Session: 30 min  Social Connections: Moderately Integrated   Frequency of Communication with Friends and Family: More than three times a week   Frequency of Social Gatherings with Friends  and Family: Once a week   Attends Religious Services: 1 to 4 times per year   Active Member of Clubs or Organizations: No   Attends Archivist Meetings: 1 to 4 times per year   Marital Status: Widowed  Stress: No Stress Concern Present   Feeling of Stress : Not at all  Tobacco Use: Low Risk    Smoking Tobacco Use: Never   Smokeless Tobacco Use: Never  Transportation Needs: No Transportation Needs   Lack of Transportation (Medical): No   Lack of Transportation (Non-Medical): No    CCM Care Plan  No Known Allergies  Medications Reviewed Today     Reviewed by Tomasa Blase, [facility] (Pharmacist) on 08/28/21 at 1037  Med List Status: <None>   Medication Order Taking? Sig Documenting Provider Last Dose Status Informant  acetaminophen (TYLENOL) 325 MG tablet [id number] No Take 650 mg by mouth every 6 (six) hours as needed. [provider] Taking Active   alendronate (FOSAMAX) 70 MG tablet [id number] No Take 1 tablet (70 mg total) by mouth every 7 (seven) days. Take with a full glass of water on an empty stomach. Binnie Rail, MD Taking Active   amLODipine (NORVASC) 5 MG tablet [id number] No TAKE 1 TABLET(5 MG) BY MOUTH EVERY MORNING Burns, Claudina Lick, MD Taking Active   amoxicillin (AMOXIL) 500 MG tablet [id number] No TK 4 TS PO 1 HOUR PRIOR TO APPOINTMENT [provider] Taking Active   aspirin 81 MG tablet [id number] No Take 81 mg by mouth daily. [provider] Taking Active Self            Med Note Delice Bison, Darnelle Maffucci   Wed Jun 28, 2021 11:16 AM)    Azelastine-Fluticasone 137-50 MCG/ACT SUSP [id number] No 1 spray in each nostril [provider] Taking Active   benazepril (LOTENSIN) 20 MG tablet [id number] No TAKE 1 TABLET(20 MG) BY MOUTH DAILY Binnie Rail, MD Taking Active   Biotin 10000 MCG TABS [id number] No Take 1 tablet by mouth daily. [provider] Taking Active   calcium carbonate (OS-CAL) 1250 (500 Ca) MG chewable tablet [id number] No Chew 1 tablet by mouth daily. [provider] Taking Active   cholecalciferol (VITAMIN D) 1000 units tablet [id number] No Take 3,000 Units by mouth daily. [provider] Taking Active   Coenzyme Q10 (CO Q-10) 100 MG CAPS [id number] No Take 1 capsule by mouth daily. [provider] Taking Active   Coenzyme Q10 (COQ10) 50 MG CAPS [id number] No See admin instructions. [provider] Taking Active   Collagen-Boron-Hyaluronic Acid (Cassia PO) [id number] No Take 1 tablet by mouth daily. [provider] Taking Active   levocetirizine (XYZAL) 5 MG tablet [id number] No Take 1 tablet by mouth daily. [provider] Taking Active   levocetirizine (XYZAL) 5 MG tablet [id number] No 1 tablet in the evening [provider] Taking Active   montelukast (SINGULAIR) 10 MG tablet [id number] No Take 10 mg by mouth at bedtime. [provider] Taking Active   montelukast (SINGULAIR) 10 MG tablet [id number] No 1 tablet [provider] Taking Active   Multiple Vitamins-Minerals (CENTRUM SILVER 50+WOMEN) TABS [id number] No Take 1 tablet by mouth daily. [provider] Taking Active   Multiple Vitamins-Minerals (PRESERVISION AREDS 2 PO) [id number] No Take 1 capsule by mouth in the morning and at bedtime. [provider] Taking Active   pravastatin (PRAVACHOL) 80 MG tablet [id number] No Take 1 tablet (80 mg total)  by mouth daily. Binnie Rail, MD Taking Active   Probiotic Product (PROBIOTIC DAILY PO) [id number] No Take 1 capsule by mouth daily. [provider] Taking Active   sertraline (ZOLOFT) 100 MG tablet [id number] No TAKE 1 TABLET(100 MG) BY MOUTH DAILY Burns, Claudina Lick, MD Taking Active   triamcinolone ointment (KENALOG) 0.1 % [id number] No apply sparingly to affected areas [provider] Taking Active             Patient Active Problem List   Diagnosis Date Noted   Hypertensive retinopathy of both eyes 04/25/2021   Macular degeneration, age related, nonexudative 93/81/0175   Systolic murmur 10/24/8526   Vitamin D deficiency 10/06/2018   Osteoporosis 04/15/2017   Diabetes mellitus without complication ([place]) 78/24/2353   Need for prophylactic antibiotic for dental work 03/06/2016   Osteoarthritis of right shoulder 10/13/2015   OSA (obstructive sleep apnea) 10/04/2015   Allergic rhinitis 08/20/2014   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   Benign paroxysmal positional vertigo 08/19/2013   Hyperlipidemia 09/09/2007   DISORDER, DYSMETABOLIC SYNDROME X 61/44/3154   Depression 09/09/2007   Essential hypertension 09/09/2007    Immunization History  Administered Date(s) Administered   Fluad Quad(high Dose 65+) 12/06/2020   Influenza Split 01/23/2012, 09/08/2014   Influenza Whole 10/02/2008   Influenza, High Dose Seasonal PF 09/26/2017, 10/06/2018, 11/09/2019   Influenza-Unspecified 10/14/2013, 10/01/2015, 09/20/2016, 09/26/2017   Pneumococcal Conjugate-13 09/17/2012   Pneumococcal Polysaccharide-23 04/03/2017   Tdap 08/13/2012   Zoster, Live 10/14/2013    Conditions to be addressed/monitored:  Hypertension, Hyperlipidemia, Depression, and Osteoporosis  Care Plan : CCM Care Plan  Updates made by Tomasa Blase, RPH since 08/28/2021 12:00 AM     Problem: HTN, HLD, Osteoporosis, Depression, Allergic Rhinitis   Priority: High  Onset Date: 06/28/2021     Long-Range Goal: Disease Management    Start Date: 06/28/2021  Expected End Date: 12/28/2021  This Visit's Progress: On track  Recent Progress: On track  Priority: High  Note:   Current Barriers:  Unable to independently monitor therapeutic efficacy Unable to achieve control of LDL / osteoporosis    Pharmacist Clinical Goal(s):  Patient will achieve adherence to monitoring guidelines and medication adherence to achieve therapeutic efficacy achieve control of LDL as evidenced by next lipid panel maintain control of blood pressure as evidenced by continued office visit values in range and at home checks at goal  through collaboration with PharmD and provider.   Interventions: 1:1 collaboration with Binnie Rail, MD regarding development and update of comprehensive plan of care as evidenced by provider attestation and co-signature Inter-disciplinary care team collaboration (see longitudinal plan of care) Comprehensive medication review performed; medication list updated in electronic medical record  Hypertension (BP goal <140/90) -Controlled -Current treatment: Benazepril 84m daily  Amlodipine 518mdaily  -Medications previously tried: n/a  -Current home readings: has not been checking at home  - Recent office visits:  -Current dietary habits: moderates sodium and caffeine intake  -Current exercise habits: exercises doing chair workouts AM and PM (mobility limited due to knee pain)  - Has been told she is a candidate for knee replacement but must lose weight first, patient notes to weight loss of 30 lbs since making changes to her diet -Denies hypotensive/hypertensive symptoms -Educated on BP goals and benefits of medications for prevention of heart attack, stroke and kidney damage; Daily salt intake goal < 2300 mg; Importance of home blood pressure monitoring; Proper BP monitoring technique; -Counseled to monitor BP at home  at least every other week, document, and provide log at future appointments -Counseled on diet  and exercise extensively Recommended to continue current medication  Hyperlipidemia: (LDL goal < 100) -Not ideally controlled - Last LDL 124 mg/dL (06/06/2021)  -Current treatment: Pravastatin 65m daily  Aspirin 841mdaily  -Medications previously tried: n/a  -Current dietary patterns: has been more strict with her diet (recent weight loss of 30 lbs) - moderates fried/ fatty food intake  -Current exercise habits: chair exercises morning and night  -Educated on Cholesterol goals;  Benefits of statin for ASCVD risk reduction; Importance of limiting foods high in cholesterol; Strategies to manage statin-induced myalgias; -Counseled on diet and exercise extensively Continue current medications, will be due for lipid panel with next PCP appointment 12/06/21  Depression(Goal: Mood control/ prevention of disease progression) -Controlled -Current treatment: Sertraline 10083maily  -Medications previously tried/failed: n/a -PHQ9: 3 -Educated on Benefits of medication for symptom control -Recommended to continue current medication  Osteoporosis (Goal prevention of fractures)  -Not ideally controlled -Last DEXA Scan: 06/07/2021   T-Score right femoral neck: -2.5  T-Score left femoral neck: -1.7  T-Score lumbar spine: -2.0  10-year probability of major osteoporotic fracture: 12%  10-year probability of hip fracture: 2.7%  -Patient is a candidate for pharmacologic treatment due to T-Score < -2.5 in femoral neck -Current treatment  Calcium chewable - 1250m73mily  Vit D3 - 3000 units daily  Alendronate 70mg5m tablet weekly  -Medications previously tried: n/a  -Recommend [phone] units of vitamin D daily. Recommend 1200 mg of calcium daily from dietary and supplemental sources. Counseled on oral bisphosphonate administration: take in the morning, 30 minutes prior to food with 6-8 oz of water. Do not lie down for at least 30 minutes after taking. Recommend weight-bearing and muscle  strengthening exercises for building and maintaining bone density. -Recommended for patient to continue current medications  Allergic Rhinitis (Goal: Prevention/ Control of Allergies) -Controlled -Current treatment  Levocetirizine 5mg d25my Montelukast 10mg d64m  -Medications previously tried: azelastine, fluticasone nose spray, claritin D, claritin  -Recommended to continue current medication  Health Maintenance -Vaccine gaps: n/a -Current therapy:  Preservision AERDS 2 multivitamins - 1 tablet daily  Biotin 10,000 mcg daily  Move Free supplement - 1 tablet daily  Acetaminophen 325mg - 51mblets twice daily as needed  Centrum silver womens 50+ - 1 tablet daily  Probiotic - 1 capsule daily  CoQ10 - 100mg dai26mAmoxicillin 500mg - 4 74mets prior to dental procedure (prophylaxis) -Educated on Herbal supplement research is limited and benefits usually cannot be proven Cost vs benefit of each product must be carefully weighed by individual consumer Supplements may interfere with prescription drugs -Patient is satisfied with current therapy and denies issues -Recommended to continue current medication  Patient Goals/Self-Care Activities Patient will:  - take medications as prescribed check blood pressure every other week, document, and provide at future appointments - Prevent falls by ensuring proper footwear, using railings / stabilizing self when needed   Follow Up Plan: Telephone follow up appointment with care management team member scheduled for: 4 months The patient has been provided with contact information for the care management team and has been advised to call with any health related questions or concerns.         Medication Assistance: None required.  Patient affirms current coverage meets needs.  Compliance/Adherence/Medication fill history: Care Gaps: Foot Exam   Patient's preferred pharmacy is:  [id number] [place]  [address] [place] [address] Phone: [phone] Fax: [phone]  [facility] [place], [place] AT [address] [place] [address] Phone: [phone] Fax: [phone]   Uses pill box? Yes Pt endorses 100% compliance  Care Plan and Follow Up Patient Decision:  Patient agrees to Care Plan and Follow-up.  Plan: Telephone follow up appointment with care management team member scheduled for:  4 months  and The patient has been provided with contact information for the care management team and has been advised to call with any health related questions or concerns.   Tomasa Blase, PharmD Clinical Pharmacist, [place]

## 2021-08-28 NOTE — Patient Instructions (Signed)
Visit Information  PATIENT GOALS:  Goals Addressed             This Visit's Progress    Prevent Falls and Broken Bones-Osteoporosis   On track    Timeframe:  Long-Range Goal Priority:  High Start Date:  06/28/2021                          Expected End Date: 12/28/2021                      Follow Up Date 08/28/2021   - always use handrails on the stairs - always wear shoes or slippers with non-slip sole - get at least 10 minutes of activity every day - keep cell phone with me always - make an emergency alert plan in case I fall    Why is this important?   When you fall, there are 3 things that control if a bone breaks or not.  These are the fall itself, how hard and the direction that you fall and how fragile your bones are.  Preventing falls is very important for you because of fragile bones.             Patient verbalizes understanding of instructions provided today and agrees to view in [place].   Telephone follow up appointment with care management team member scheduled for: 4 months The patient has been provided with contact information for the care management team and has been advised to call with any health related questions or concerns.   Tomasa Blase, PharmD Clinical Pharmacist, [place]

## 2021-09-07 ENCOUNTER — Telehealth: Payer: Self-pay

## 2021-09-07 NOTE — Telephone Encounter (Signed)
Type of form received : [place] form  Form placed in Provider Box  Additional instructions from the patient (:Please mail to the patient   Patient notified it will take 7-10 days for Completion

## 2021-09-11 ENCOUNTER — Encounter: Payer: Self-pay | Admitting: Internal Medicine

## 2021-09-11 NOTE — Telephone Encounter (Signed)
Placed in Dr. Quay Burow office for signature

## 2021-09-12 NOTE — Telephone Encounter (Signed)
Form completed and mailed out today for patient. Copy made incase forms are lost. Patient informed they were mailed out today.

## 2021-10-07 LAB — HM MAMMOGRAPHY

## 2021-10-12 LAB — HM DIABETES EYE EXAM

## 2021-10-17 ENCOUNTER — Encounter: Payer: Self-pay | Admitting: Internal Medicine

## 2021-10-17 NOTE — Progress Notes (Signed)
Outside notes received. Information abstracted. Notes sent to scan.  

## 2021-10-19 ENCOUNTER — Encounter: Payer: Self-pay | Admitting: Internal Medicine

## 2021-10-19 NOTE — Progress Notes (Signed)
Outside notes received. Information abstracted. Notes sent to scan.  

## 2021-10-23 NOTE — Progress Notes (Addendum)
[facility] Note  10/27/2021     CHIEF COMPLAINT Patient presents for Retina Follow Up   HISTORY OF PRESENT ILLNESS: Jane Montgomery is a 71 y.o. female who presents to the clinic today for:   HPI     Retina Follow Up   Patient presents with  Dry AMD.  In both eyes.  This started 3 months ago.  I, the attending physician,  performed the HPI with the patient and updated documentation appropriately.        Comments   Patient here for 3 months retina follow up for ARMD OU. Patient states vision about the same. No eye pain. Dr Kathlen Mody put on Latanoprost QHS OU 2 weeks ago.       Last edited by Bernarda Caffey, MD on 10/29/2021  4:07 AM.    Pt states vision is "about the same", pt states Dr. Kathlen Mody started her on latanoprost a couple weeks ago   Referring physician: Hortencia Pilar, MD [place],  [place] [address]  HISTORICAL INFORMATION:   Selected notes from the MEDICAL RECORD NUMBER Referred by Dr. Kathlen Mody for eval of GA paracentral mac   CURRENT MEDICATIONS: No current outpatient medications on file. (Ophthalmic Drugs)   No current facility-administered medications for this visit. (Ophthalmic Drugs)   Current Outpatient Medications (Other)  Medication Sig   acetaminophen (TYLENOL) 325 MG tablet Take 650 mg by mouth every 6 (six) hours as needed.   alendronate (FOSAMAX) 70 MG tablet Take 1 tablet (70 mg total) by mouth every 7 (seven) days. Take with a full glass of water on an empty stomach.   amLODipine (NORVASC) 5 MG tablet TAKE 1 TABLET(5 MG) BY MOUTH EVERY MORNING   amoxicillin (AMOXIL) 500 MG tablet TK 4 TS PO 1 HOUR PRIOR TO APPOINTMENT   aspirin 81 MG tablet Take 81 mg by mouth daily.   Azelastine-Fluticasone 137-50 MCG/ACT SUSP 1 spray in each nostril   benazepril (LOTENSIN) 20 MG tablet TAKE 1 TABLET(20 MG) BY MOUTH DAILY   Biotin 10000 MCG TABS Take 1 tablet by mouth daily.   calcium carbonate (OS-CAL)  1250 (500 Ca) MG chewable tablet Chew 1 tablet by mouth daily.   cholecalciferol (VITAMIN D) 1000 units tablet Take 3,000 Units by mouth daily.   Coenzyme Q10 (CO Q-10) 100 MG CAPS Take 1 capsule by mouth daily.   Coenzyme Q10 (COQ10) 50 MG CAPS See admin instructions.   Collagen-Boron-Hyaluronic Acid (MOVE FREE ULTRA JOINT HEALTH PO) Take 1 tablet by mouth daily.   levocetirizine (XYZAL) 5 MG tablet Take 1 tablet by mouth daily.   levocetirizine (XYZAL) 5 MG tablet 1 tablet in the evening   montelukast (SINGULAIR) 10 MG tablet Take 10 mg by mouth at bedtime.   montelukast (SINGULAIR) 10 MG tablet 1 tablet   Multiple Vitamins-Minerals (CENTRUM SILVER 50+WOMEN) TABS Take 1 tablet by mouth daily.   Multiple Vitamins-Minerals (PRESERVISION AREDS 2 PO) Take 1 capsule by mouth in the morning and at bedtime.   pravastatin (PRAVACHOL) 80 MG tablet Take 1 tablet (80 mg total) by mouth daily.   Probiotic Product (PROBIOTIC DAILY PO) Take 1 capsule by mouth daily.   sertraline (ZOLOFT) 100 MG tablet TAKE 1 TABLET(100 MG) BY MOUTH DAILY   triamcinolone ointment (KENALOG) 0.1 % apply sparingly to affected areas   No current facility-administered medications for this visit. (Other)   REVIEW OF SYSTEMS: ROS   Positive for: Gastrointestinal, Neurological, Musculoskeletal, Endocrine,  Cardiovascular, Eyes, Respiratory Negative for: Constitutional, Skin, Genitourinary, HENT, Psychiatric, Allergic/Imm, Heme/Lymph Last edited by Theodore Demark, COA on 10/27/2021  1:15 PM.     ALLERGIES No Known Allergies  PAST MEDICAL HISTORY Past Medical History:  Diagnosis Date   Allergy    Anisocoria    evaluated by Dr Erling Cruz 2005   Arthritis    Benign positional vertigo    in LLDP   Chronic back pain    reason unknown    Depression    takes Zoloft daily- hormone imbalance - not truly depression per pt    Diarrhea    Diverticulosis    Gestational diabetes 1982, 1984   "pre"   Gilbert syndrome    Heart  murmur    Hyperlipidemia    takes Pravastatin daily   Hypertension    takes Amlodipine daily as well as Benazepril   Hypertensive retinopathy    Joint pain    Macular degeneration    early stages and dry   Nocturia    Osteoporosis    Shortness of breath dyspnea    allergy related and occasionally will take Claritin.   Sleep apnea    has cpap   Tremor, essential    Dr Erling Cruz   Past Surgical History:  Procedure Laterality Date   CATARACT EXTRACTION Bilateral 12/31/2013   CESAREAN SECTION     x 2   COLONOSCOPY  [phone]   Negative, redundant colon; [facility]   DILATION AND CURETTAGE OF UTERUS     Dr Irven [place]   EYE SURGERY     PLANTAR FASCIA SURGERY Bilateral    TONSILLECTOMY     TOTAL SHOULDER ARTHROPLASTY Right 10/13/2015   Procedure: TOTAL SHOULDER ARTHROPLASTY;  Surgeon: [name], MD;  Location: [facility];  Service: Orthopedics;  Laterality: Right;  Right total shoulder arthroplasty    FAMILY HISTORY Family History  Problem Relation Age of Onset   Hypertension Father    Heart attack Father 81       CABG    Colon cancer Mother 37   Macular degeneration Mother    Hypertension Mother    Seizures Mother        ? etiology (preceded CVA)   Stroke Mother 88   Osteoporosis Mother    Myelodysplastic syndrome Mother    Colon cancer Maternal Grandmother    Colon cancer Maternal Aunt    Colon cancer Other        2 Maternal Great Aunts   Heart attack Paternal Grandfather        > 10   Heart attack Paternal Aunt 17   Leukemia Paternal Grandmother    Diabetes Neg Hx    Colon polyps Neg Hx    Esophageal cancer Neg Hx    Rectal cancer Neg Hx    Stomach cancer Neg Hx     SOCIAL HISTORY Social History   Tobacco Use   Smoking status: Never   Smokeless tobacco: Never  Vaping Use   Vaping Use: Never used  Substance Use Topics   Alcohol use: No   Drug use: No       OPHTHALMIC EXAM: Base Eye Exam     Visual Acuity (Snellen - Linear)       Right Left    Dist cc 20/30 20/40 -1   Dist ph cc NI NI    Correction: Glasses         Tonometry (Tonopen, 1:12 PM)       Right Left  Pressure 13 12         Pupils       Dark Light Shape React APD   Right 3 2 Round Slow None   Left 4 3 Round Brisk None         Visual Fields (Counting fingers)       Left Right    Full Full         Extraocular Movement       Right Left    Full, Ortho Full, Ortho         Neuro/Psych     Oriented x3: Yes   Mood/Affect: Normal         Dilation     Both eyes: 1.0% Mydriacyl, 2.5% Phenylephrine @ 1:12 PM           Slit Lamp and Fundus Exam     Slit Lamp Exam       Right Left   Lids/Lashes Dermatochalasis - upper lid Dermatochalasis - upper lid   Conjunctiva/Sclera White and quiet White and quiet   Cornea arcus, well healed cataract wounds arcus, well healed cataract wounds   Anterior Chamber Deep and quiet Deep and quiet   Iris Round and dilated Round and dilated, +PPM   Lens PC IOL in good position with open PC PC IOL in good position    Vitreous Vitreous syneresis, Posterior vitreous detachment, mild vitreous condensations Vitreous syneresis, Posterior vitreous detachment         Fundus Exam       Right Left   Disc mild Pallor, Sharp rim, mild PPA mild Pallor, Sharp rim, mild PPA   C/D Ratio 0.5 0.6   Macula Flat, Blunted foveal reflex, RPE mottling and clumping, Drusen, focal round area of atrophy IN to fovea (about 0.6DD), No heme or edema Flat, Blunted foveal reflex, RPE mottling and clumping, diffuse atrophy, No heme or edema   Vessels attenuated attenuated, mild tortuousity   Periphery Attached, scattered pavingstone degeneration, mild reticular degeneration Attached, scattered pavingstone degeneration, mild reticular degeneration           Refraction     Wearing Rx       Sphere Cylinder Axis Add   Right -2.50 +0.75 003 +2.50   Left -0.50 +0.50 163 +2.50            IMAGING AND PROCEDURES   Imaging and Procedures for 10/27/2021  OCT, Retina - OU - Both Eyes       Right Eye Quality was good. Central Foveal Thickness: 273. Progression has been stable. Findings include normal foveal contour, no IRF, no SRF, outer retinal atrophy, retinal drusen (Diffuse ORA, focal patch of severe thinning IN mac).   Left Eye Quality was good. Central Foveal Thickness: 272. Progression has improved. Findings include no SRF, outer retinal atrophy, retinal drusen , normal foveal contour, no IRF (Interval improvement in central cystic changes).   Notes *Images captured and stored on drive  Diagnosis / Impression:  Non-exudative ARMD OU Drusen and ORA OU OD - interval improvement in cystic changes  Clinical management:  See below  Abbreviations: NFP - Normal foveal profile. CME - cystoid macular edema. PED - pigment epithelial detachment. IRF - intraretinal fluid. SRF - subretinal fluid. EZ - ellipsoid zone. ERM - epiretinal membrane. ORA - outer retinal atrophy. ORT - outer retinal tubulation. SRHM - subretinal hyper-reflective material. IRHM - intraretinal hyper-reflective material            ASSESSMENT/PLAN:    ICD-10-CM  1. Intermediate stage nonexudative age-related macular degeneration of both eyes  H35.3132     2. Retinal edema  H35.81 OCT, Retina - OU - Both Eyes    3. 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 Essential hypertension  I10     5. Hypertensive retinopathy of both eyes  H35.033     6. Pseudophakia, both eyes  Z96.1      1-3. Age related macular degeneration, non-exudative, both eyes vs toxic maculopathy OU  - Recommend amsler grid monitoring  - OCT shows drusen and +ORA OU  - FA 4.25.22 shows staining and window defects, but no CNV OU; no vasculitis  - Optos FAF shows circumscribed hyper and hypoautofluorescence in posterior pole around macula and disc -- very similar in appearance to Elmiron-associated macular toxicity  - pt denies history of  Elmiron use and has no history of interstitial cystitis  - differential includes retinal dystrophy / inherited retinal disease, other  - discussed findings, prognosis (BCVA 20/30 OD, 20/40 OS)  - pt reports her mother had severe ARMD -"Mom was blind before she died)  - no ophthalmic intervention indicated or recommended at this time -- monitor   - f/u 6 months, DFE, OCT  4,5. Hypertensive retinopathy OU - discussed importance of tight BP control - monitor  6. Pseudophakia OU  - s/p CE/IOL OU  - IOsL in good position, doing well  - monitor   Ophthalmic Meds Ordered this visit:  No orders of the defined types were placed in this encounter.    Return in about 6 months (around 04/27/2022) for f/u exu ARMD OU, DFE.  There are no Patient Instructions on file for this visit.   Explained the diagnoses, plan, and follow up with the patient and they expressed understanding.  Patient expressed understanding of the importance of proper follow up care.   This document serves as a record of services personally performed by Gardiner Sleeper, MD, PhD. It was created on their behalf by Leonie Douglas, an ophthalmic technician. The creation of this record is the provider's dictation and/or activities during the visit.    Electronically signed by: Leonie Douglas COA, 10/29/21  4:17 AM  This document serves as a record of services personally performed by Gardiner Sleeper, MD, PhD. It was created on their behalf by San Jetty. Owens Shark, OA an ophthalmic technician. The creation of this record is the provider's dictation and/or activities during the visit.    Electronically signed by: San Jetty. Owens Shark, [place] 10.28.2022 4:17 AM  Gardiner Sleeper, M.D., Ph.D. Diseases & Surgery of the Retina and [place] 10/27/2021  I have reviewed the above documentation for accuracy and completeness, and I agree with the above. Gardiner Sleeper, M.D., Ph.D. 10/29/21 4:17 AM  Abbreviations: M  myopia (nearsighted); A astigmatism; H hyperopia (farsighted); P presbyopia; Mrx spectacle prescription;  CTL contact lenses; OD right eye; OS left eye; OU both eyes  XT exotropia; ET esotropia; PEK punctate epithelial keratitis; PEE punctate epithelial erosions; DES dry eye syndrome; MGD meibomian gland dysfunction; ATs artificial tears; PFAT's preservative free artificial tears; [place] nuclear sclerotic cataract; PSC posterior subcapsular cataract; ERM epi-retinal membrane; PVD posterior vitreous detachment; RD retinal detachment; DM diabetes mellitus; DR diabetic retinopathy; NPDR non-proliferative diabetic retinopathy; PDR proliferative diabetic retinopathy; CSME clinically significant macular edema; DME diabetic macular edema; dbh dot blot hemorrhages; CWS cotton wool spot; POAG primary open angle glaucoma; C/D cup-to-disc ratio; HVF humphrey visual field;  GVF goldmann visual field; OCT optical coherence tomography; IOP intraocular pressure; BRVO Branch retinal vein occlusion; CRVO central retinal vein occlusion; CRAO central retinal artery occlusion; BRAO branch retinal artery occlusion; RT retinal tear; SB scleral buckle; PPV pars plana vitrectomy; VH Vitreous hemorrhage; PRP panretinal laser photocoagulation; IVK intravitreal kenalog; VMT vitreomacular traction; MH Macular hole;  NVD neovascularization of the disc; NVE neovascularization elsewhere; AREDS age related eye disease study; ARMD age related macular degeneration; POAG primary open angle glaucoma; EBMD epithelial/anterior basement membrane dystrophy; ACIOL anterior chamber intraocular lens; IOL intraocular lens; PCIOL posterior chamber intraocular lens; Phaco/IOL phacoemulsification with intraocular lens placement; [place] photorefractive keratectomy; LASIK laser assisted in situ keratomileusis; HTN hypertension; DM diabetes mellitus; COPD chronic obstructive pulmonary disease

## 2021-10-27 ENCOUNTER — Other Ambulatory Visit: Payer: Self-pay

## 2021-10-27 ENCOUNTER — Encounter (INDEPENDENT_AMBULATORY_CARE_PROVIDER_SITE_OTHER): Payer: Self-pay | Admitting: Ophthalmology

## 2021-10-27 ENCOUNTER — Ambulatory Visit (INDEPENDENT_AMBULATORY_CARE_PROVIDER_SITE_OTHER): Payer: Medicare Other | Admitting: Ophthalmology

## 2021-10-27 DIAGNOSIS — I10 Essential (primary) hypertension: Secondary | ICD-10-CM | POA: Diagnosis not present

## 2021-10-27 DIAGNOSIS — H35033 Hypertensive retinopathy, bilateral: Secondary | ICD-10-CM

## 2021-10-27 DIAGNOSIS — H35383 Toxic maculopathy, bilateral: Secondary | ICD-10-CM | POA: Diagnosis not present

## 2021-10-27 DIAGNOSIS — H353132 Nonexudative age-related macular degeneration, bilateral, intermediate dry stage: Secondary | ICD-10-CM | POA: Diagnosis not present

## 2021-10-27 DIAGNOSIS — Z961 Presence of intraocular lens: Secondary | ICD-10-CM

## 2021-10-27 DIAGNOSIS — H3581 Retinal edema: Secondary | ICD-10-CM

## 2021-10-29 ENCOUNTER — Encounter (INDEPENDENT_AMBULATORY_CARE_PROVIDER_SITE_OTHER): Payer: Self-pay | Admitting: Ophthalmology

## 2021-11-01 ENCOUNTER — Telehealth: Payer: Self-pay

## 2021-11-01 NOTE — Progress Notes (Signed)
Chronic Care Management Pharmacy Assistant   Name: CASTELLA LERNER  MRN: [id number] DOB: [birth year]  Reason for Encounter: Disease State   Conditions to be addressed/monitored: General Adherence    Recent office visits:  None ID  Recent consult visits:  10/27/21 Bernarda Caffey, MD-Ophthamology (Intermediate stage nonexudative age-related macular degeneration of both eyes) Orders: OCT, Retina-OU-Both Eyes No med changes  Hospital visits:  None in previous 6 months  Medications: Outpatient Encounter Medications as of 11/01/2021  Medication Sig   acetaminophen (TYLENOL) 325 MG tablet Take 650 mg by mouth every 6 (six) hours as needed.   alendronate (FOSAMAX) 70 MG tablet Take 1 tablet (70 mg total) by mouth every 7 (seven) days. Take with a full glass of water on an empty stomach.   amLODipine (NORVASC) 5 MG tablet TAKE 1 TABLET(5 MG) BY MOUTH EVERY MORNING   amoxicillin (AMOXIL) 500 MG tablet TK 4 TS PO 1 HOUR PRIOR TO APPOINTMENT   aspirin 81 MG tablet Take 81 mg by mouth daily.   Azelastine-Fluticasone 137-50 MCG/ACT SUSP 1 spray in each nostril   benazepril (LOTENSIN) 20 MG tablet TAKE 1 TABLET(20 MG) BY MOUTH DAILY   Biotin 10000 MCG TABS Take 1 tablet by mouth daily.   calcium carbonate (OS-CAL) 1250 (500 Ca) MG chewable tablet Chew 1 tablet by mouth daily.   cholecalciferol (VITAMIN D) 1000 units tablet Take 3,000 Units by mouth daily.   Coenzyme Q10 (CO Q-10) 100 MG CAPS Take 1 capsule by mouth daily.   Coenzyme Q10 (COQ10) 50 MG CAPS See admin instructions.   Collagen-Boron-Hyaluronic Acid (MOVE FREE ULTRA JOINT HEALTH PO) Take 1 tablet by mouth daily.   levocetirizine (XYZAL) 5 MG tablet Take 1 tablet by mouth daily.   levocetirizine (XYZAL) 5 MG tablet 1 tablet in the evening   montelukast (SINGULAIR) 10 MG tablet Take 10 mg by mouth at bedtime.   montelukast (SINGULAIR) 10 MG tablet 1 tablet   Multiple Vitamins-Minerals (CENTRUM SILVER 50+WOMEN) TABS Take 1 tablet  by mouth daily.   Multiple Vitamins-Minerals (PRESERVISION AREDS 2 PO) Take 1 capsule by mouth in the morning and at bedtime.   pravastatin (PRAVACHOL) 80 MG tablet Take 1 tablet (80 mg total) by mouth daily.   Probiotic Product (PROBIOTIC DAILY PO) Take 1 capsule by mouth daily.   sertraline (ZOLOFT) 100 MG tablet TAKE 1 TABLET(100 MG) BY MOUTH DAILY   triamcinolone ointment (KENALOG) 0.1 % apply sparingly to affected areas   No facility-administered encounter medications on file as of 11/01/2021.   Pharmacist Review Have you had any problems recently with your health? Patient states that she is not having any new health issues  Have you had any problems with your pharmacy? Patient states that she does not have any problems with getting medications or the cost of medications from the pharmacy  What issues or side effects are you having with your medications? Patient states that she does not have any side effects from medications  What would you like me to pass along to [facility] for them to help you with? Patient states that she is doing well and dose not have any concerns about health or medications  What can we do to take care of you better? Patient states nothing at this time  Care Gaps: Colonoscopy-07/14/18 Diabetic Foot Exam-10/06/18 Mammogram-10/07/21 Ophthalmology-10/12/21 Dexa Scan - 06/07/21 Annual Well Visit - 12/06/20 Micro albumin-NA Hemoglobin A1c- 06/06/21  Star Rating Drugs: Pravastatin 80 mg-last fill 10/24/21 90 ds Benazepril 20  mg-last fill 08/23/21 90 ds  [place] Pharmacist Assistant [phone]

## 2021-11-18 ENCOUNTER — Other Ambulatory Visit: Payer: Self-pay | Admitting: Internal Medicine

## 2021-12-05 ENCOUNTER — Encounter: Payer: Self-pay | Admitting: Internal Medicine

## 2021-12-05 NOTE — Progress Notes (Signed)
Subjective:    Patient ID: Jane Montgomery, female    DOB: [birth year], 71 y.o.   MRN: [id number]  This visit occurred during the SARS-CoV-2 public health emergency.  Safety protocols were in place, including screening questions prior to the visit, additional usage of staff PPE, and extensive cleaning of exam room while observing appropriate contact time as indicated for disinfecting solutions.     HPI The patient is here for follow up of their chronic medical problems, including htn, DM, hld, depression, osteoporosis  She is taking her medication as prescribed.  She is dealing with her severe right knee arthritis, but had recent injections and is wearing a brace.  The injections did not help much, but she feels the brace is helping.  She is limited in her activity by the pain.    Medications and allergies reviewed with patient and updated if appropriate.  Patient Active Problem List   Diagnosis Date Noted   Hypertensive retinopathy of both eyes 04/25/2021   Macular degeneration, age related, nonexudative 09/22/3006   Systolic murmur 62/26/3335   Vitamin D deficiency 10/06/2018   Osteoporosis 04/15/2017   Diabetes mellitus without complication ([place]) 45/62/5638   Need for prophylactic antibiotic for dental work 03/06/2016   Osteoarthritis of right shoulder 10/13/2015   OSA (obstructive sleep apnea) 10/04/2015   Allergic rhinitis 08/20/2014   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   Benign paroxysmal positional vertigo 08/19/2013   Hyperlipidemia 09/09/2007   DISORDER, DYSMETABOLIC SYNDROME X 93/73/4287   Depression 09/09/2007   Essential hypertension 09/09/2007    Current Outpatient Medications on File Prior to Visit  Medication Sig Dispense Refill   acetaminophen (TYLENOL) 325 MG tablet Take 650 mg by mouth every 6 (six) hours as needed.     alendronate (FOSAMAX) 70 MG tablet Take 1 tablet (70 mg total) by mouth every 7 (seven) days. Take with a full glass of water on an empty  stomach. 4 tablet 11   amLODipine (NORVASC) 5 MG tablet TAKE 1 TABLET(5 MG) BY MOUTH EVERY MORNING 90 tablet 1   amoxicillin (AMOXIL) 500 MG tablet TK 4 TS PO 1 HOUR PRIOR TO APPOINTMENT  0   aspirin 81 MG tablet Take 81 mg by mouth daily.     Azelastine-Fluticasone 137-50 MCG/ACT SUSP 1 spray in each nostril     benazepril (LOTENSIN) 20 MG tablet TAKE 1 TABLET(20 MG) BY MOUTH DAILY 90 tablet 1   Biotin 10000 MCG TABS Take 1 tablet by mouth daily.     calcium carbonate (OS-CAL) 1250 (500 Ca) MG chewable tablet Chew 1 tablet by mouth daily.     cholecalciferol (VITAMIN D) 1000 units tablet Take 3,000 Units by mouth daily.     Coenzyme Q10 (CO Q-10) 100 MG CAPS Take 1 capsule by mouth daily.     Coenzyme Q10 (COQ10) 50 MG CAPS See admin instructions.     Collagen-Boron-Hyaluronic Acid (MOVE FREE ULTRA JOINT HEALTH PO) Take 1 tablet by mouth daily.     latanoprost (XALATAN) 0.005 % ophthalmic solution SMARTSIG:1 Drop(s) In Eye(s) Every Evening     levocetirizine (XYZAL) 5 MG tablet 1 tablet in the evening     montelukast (SINGULAIR) 10 MG tablet Take 10 mg by mouth at bedtime.     Multiple Vitamins-Minerals (CENTRUM SILVER 50+WOMEN) TABS Take 1 tablet by mouth daily.     Multiple Vitamins-Minerals (PRESERVISION AREDS 2 PO) Take 1 capsule by mouth in the morning and at bedtime.     pravastatin (  PRAVACHOL) 80 MG tablet Take 1 tablet (80 mg total) by mouth daily. 90 tablet 1   Probiotic Product (PROBIOTIC DAILY PO) Take 1 capsule by mouth daily.     sertraline (ZOLOFT) 100 MG tablet TAKE 1 TABLET(100 MG) BY MOUTH DAILY 90 tablet 1   triamcinolone ointment (KENALOG) 0.1 % apply sparingly to affected areas     No current facility-administered medications on file prior to visit.    Past Medical History:  Diagnosis Date   Allergy    Anisocoria    evaluated by Dr Erling Cruz 2005   Arthritis    Benign positional vertigo    in LLDP   Chronic back pain    reason unknown    Depression    takes Zoloft  daily- hormone imbalance - not truly depression per pt    Diarrhea    Diverticulosis    Gestational diabetes 1982, 1984   "pre"   Rosanna Randy syndrome    Heart murmur    Hyperlipidemia    takes Pravastatin daily   Hypertension    takes Amlodipine daily as well as Benazepril   Hypertensive retinopathy    Joint pain    Macular degeneration    early stages and dry   Nocturia    Osteoporosis    Shortness of breath dyspnea    allergy related and occasionally will take Claritin.   Sleep apnea    has cpap   Tremor, essential    Dr Erling Cruz    Past Surgical History:  Procedure Laterality Date   CATARACT EXTRACTION Bilateral 12/31/2013   CESAREAN SECTION     x 2   COLONOSCOPY  [id number]   Negative, redundant colon; [facility]   DILATION AND CURETTAGE OF UTERUS     Dr Irven [place]   EYE SURGERY     PLANTAR FASCIA SURGERY Bilateral    TONSILLECTOMY     TOTAL SHOULDER ARTHROPLASTY Right 10/13/2015   Procedure: TOTAL SHOULDER ARTHROPLASTY;  Surgeon: [name], MD;  Location: [facility];  Service: Orthopedics;  Laterality: Right;  Right total shoulder arthroplasty    Social History   Socioeconomic History   Marital status: Widowed    Spouse name: Not on file   Number of children: 2   Years of education: Not on file   Highest education level: Not on file  Occupational History   Not on file  Tobacco Use   Smoking status: Never   Smokeless tobacco: Never  Vaping Use   Vaping Use: Never used  Substance and Sexual Activity   Alcohol use: No   Drug use: No   Sexual activity: Yes    Birth control/protection: Post-menopausal  Other Topics Concern   Not on file  Social History Narrative   Not on file   Social Determinants of Health   Financial Resource Strain: Low Risk    Difficulty of Paying Living Expenses: Not hard at all  Food Insecurity: No Food Insecurity   Worried About Charity fundraiser in the Last Year: Never true   [place] in the Last Year: Never true   Transportation Needs: No Transportation Needs   Lack of Transportation (Medical): No   Lack of Transportation (Non-Medical): No  Physical Activity: Sufficiently Active   Days of Exercise per Week: 5 days   Minutes of Exercise per Session: 30 min  Stress: No Stress Concern Present   Feeling of Stress : Not at all  Social Connections: Moderately Integrated   Frequency of Communication with  Friends and Family: More than three times a week   Frequency of Social Gatherings with Friends and Family: Once a week   Attends Religious Services: 1 to 4 times per year   Active Member of [employer] or Organizations: No   Attends Archivist Meetings: 1 to 4 times per year   Marital Status: Widowed    Family History  Problem Relation Age of Onset   Hypertension Father    Heart attack Father 3       CABG    Colon cancer Mother 46   Macular degeneration Mother    Hypertension Mother    Seizures Mother        ? etiology (preceded CVA)   Stroke Mother 18   Osteoporosis Mother    Myelodysplastic syndrome Mother    Colon cancer Maternal Grandmother    Colon cancer Maternal Aunt    Colon cancer Other        2 Maternal Great Aunts   Heart attack Paternal Grandfather        > 1   Heart attack Paternal Aunt 30   Leukemia Paternal Grandmother    Diabetes Neg Hx    Colon polyps Neg Hx    Esophageal cancer Neg Hx    Rectal cancer Neg Hx    Stomach cancer Neg Hx     Review of Systems  Constitutional:  Negative for chills and fever.  Respiratory:  Negative for cough, shortness of breath and wheezing.   Cardiovascular:  Positive for palpitations (last couple of weeks - likely stress related). Negative for chest pain and leg swelling.  Neurological:  Positive for headaches (left side of head later afternoons sometimes). Negative for dizziness and light-headedness.      Objective:   Vitals:   12/06/21 0916  BP: 128/74  Pulse: 68  Temp: 98.3 F (36.8 C)  SpO2: 98%   BP Readings  from Last 3 Encounters:  12/06/21 128/74  06/06/21 130/74  02/28/21 128/70   Wt Readings from Last 3 Encounters:  12/06/21 249 lb (112.9 kg)  06/06/21 249 lb (112.9 kg)  02/28/21 255 lb 9.6 oz (115.9 kg)   Body mass index is 45.54 kg/m.   Physical Exam    Constitutional: Appears well-developed and well-nourished. No distress.  HENT:  Head: Normocephalic and atraumatic.  Neck: Neck supple. No tracheal deviation present. No thyromegaly present.  No cervical lymphadenopathy Cardiovascular: Normal rate, regular rhythm and normal heart sounds.   No murmur heard. No carotid bruit .  No edema Pulmonary/Chest: Effort normal and breath sounds normal. No respiratory distress. No has no wheezes. No rales.  Skin: Skin is warm and dry. Not diaphoretic.  Psychiatric: Normal mood and affect. Behavior is normal.      Assessment & Plan:    See Problem List for Assessment and Plan of chronic medical problems.

## 2021-12-05 NOTE — Patient Instructions (Addendum)
    Blood work was ordered.      Medications changes include :   none     Please followup in 6 months  

## 2021-12-06 ENCOUNTER — Ambulatory Visit (INDEPENDENT_AMBULATORY_CARE_PROVIDER_SITE_OTHER): Payer: Medicare Other | Admitting: Internal Medicine

## 2021-12-06 ENCOUNTER — Other Ambulatory Visit: Payer: Self-pay

## 2021-12-06 VITALS — BP 128/74 | HR 68 | Temp 98.3°F | Ht 62.0 in | Wt 249.0 lb

## 2021-12-06 DIAGNOSIS — I10 Essential (primary) hypertension: Secondary | ICD-10-CM

## 2021-12-06 DIAGNOSIS — E119 Type 2 diabetes mellitus without complications: Secondary | ICD-10-CM | POA: Diagnosis not present

## 2021-12-06 DIAGNOSIS — E559 Vitamin D deficiency, unspecified: Secondary | ICD-10-CM

## 2021-12-06 DIAGNOSIS — F3289 Other specified depressive episodes: Secondary | ICD-10-CM

## 2021-12-06 DIAGNOSIS — E7849 Other hyperlipidemia: Secondary | ICD-10-CM

## 2021-12-06 DIAGNOSIS — M810 Age-related osteoporosis without current pathological fracture: Secondary | ICD-10-CM | POA: Diagnosis not present

## 2021-12-06 LAB — COMPREHENSIVE METABOLIC PANEL
ALT: 40 U/L — ABNORMAL HIGH (ref 0–35)
AST: 30 U/L (ref 0–37)
Albumin: 4.5 g/dL (ref 3.5–5.2)
Alkaline Phosphatase: 106 U/L (ref 39–117)
BUN: 17 mg/dL (ref 6–23)
CO2: 29 mEq/L (ref 19–32)
Calcium: 9.6 mg/dL (ref 8.4–10.5)
Chloride: 101 mEq/L (ref 96–112)
Creatinine, Ser: 0.5 mg/dL (ref 0.40–1.20)
GFR: 95.43 mL/min (ref >60.00)
Glucose, Bld: 102 mg/dL — ABNORMAL HIGH (ref 70–99)
Potassium: 4.4 mEq/L (ref 3.5–5.1)
Sodium: 139 mEq/L (ref 135–145)
Total Bilirubin: 0.9 mg/dL (ref 0.2–1.2)
Total Protein: 7.5 g/dL (ref 6.0–8.3)

## 2021-12-06 LAB — LIPID PANEL
Cholesterol: 204 mg/dL — ABNORMAL HIGH (ref 0–200)
HDL: 62.5 mg/dL (ref >39.00)
NonHDL: 141.39
Total CHOL/HDL Ratio: 3
Triglycerides: 270 mg/dL — ABNORMAL HIGH (ref 0.0–149.0)
VLDL: 54 mg/dL — ABNORMAL HIGH (ref 0.0–40.0)

## 2021-12-06 LAB — HEMOGLOBIN A1C: Hgb A1c MFr Bld: 6.1 % (ref 4.6–6.5)

## 2021-12-06 LAB — LDL CHOLESTEROL, DIRECT: Direct LDL: 111 mg/dL

## 2021-12-06 LAB — VITAMIN D 25 HYDROXY (VIT D DEFICIENCY, FRACTURES): VITD: 36.17 ng/mL (ref 30.00–100.00)

## 2021-12-06 NOTE — Assessment & Plan Note (Signed)
Chronic Regular exercise and healthy diet encouraged Check lipid panel  Continue pravastatin 80 mg daily 

## 2021-12-06 NOTE — Assessment & Plan Note (Signed)
Chronic Controlled, Stable Continue sertraline 100 mg daily 

## 2021-12-06 NOTE — Assessment & Plan Note (Signed)
Chronic Lab Results  Component Value Date   HGBA1C 6.2 06/06/2021   Well-controlled with diet Check A1c today

## 2021-12-06 NOTE — Assessment & Plan Note (Signed)
Chronic Blood pressure well controlled CMP Continue amlodipine 5 mg daily, benazepril 20 mg daily 

## 2021-12-06 NOTE — Assessment & Plan Note (Signed)
Chronic Taking vitamin D daily Check vitamin D level  

## 2021-12-06 NOTE — Assessment & Plan Note (Addendum)
Chronic Taking calcium and vitamin D-continue DEXA done 05/2021 and showed osteoporosis-started on Fosamax at that time, which she is tolerating well-she states some weeks she does forget-recommended setting an alarm on her phone so that she does not forget

## 2021-12-18 ENCOUNTER — Other Ambulatory Visit: Payer: Self-pay

## 2021-12-18 ENCOUNTER — Ambulatory Visit (INDEPENDENT_AMBULATORY_CARE_PROVIDER_SITE_OTHER): Payer: Medicare Other

## 2021-12-18 DIAGNOSIS — M810 Age-related osteoporosis without current pathological fracture: Secondary | ICD-10-CM

## 2021-12-18 DIAGNOSIS — I10 Essential (primary) hypertension: Secondary | ICD-10-CM

## 2021-12-18 DIAGNOSIS — E7849 Other hyperlipidemia: Secondary | ICD-10-CM

## 2021-12-18 NOTE — Progress Notes (Signed)
Chronic Care Management Pharmacy Note  12/18/2021 Name:  Jane Montgomery MRN:  [id number] DOB:  Jul 23, 1950  Summary: -Patient reports that she has been adherent to pravastatin increase - denies any missed doses since last visit -Notes that she has been having issues with remembering to take alendronate once weekly - was advised to start using alarm on her phone to help with adherence  -Since increase in pravastatin dosing ldl has slightly improved, but remains elevated from goal  Recommendations/Changes made from today's visit: - Recommending for patient to start using weekly alarm to help her with fosamax adherence, recommending for increase in statin intensity - patient agreeable to start rosuvastatin 72m daily - repeat CMP and lipid panel with next PCP appointment   Subjective: Jane ROORDAis an 71y.o. year old female who is a primary patient of Burns, SClaudina Lick MD.  The CCM team was consulted for assistance with disease management and care coordination needs.    Engaged with patient by telephone for follow up visit in response to provider referral for pharmacy case management and/or care coordination services.   Consent to Services:  The patient was given the following information about Chronic Care Management services today, agreed to services, and gave verbal consent: 1. CCM service includes personalized support from designated clinical staff supervised by the primary care provider, including individualized plan of care and coordination with other care providers 2. 24/7 contact phone numbers for assistance for urgent and routine care needs. 3. Service will only be billed when office clinical staff spend 20 minutes or more in a month to coordinate care. 4. Only one practitioner may furnish and bill the service in a calendar month. 5.The patient may stop CCM services at any time (effective at the end of the month) by phone call to the office staff. 6. The patient will be responsible for  cost sharing (co-pay) of up to 20% of the service fee (after annual deductible is met). Patient agreed to services and consent obtained.  Patient Care Team: BBinnie Rail MD as PCP - General (Internal Medicine) HMonna Fam MD as Consulting Physician (Ophthalmology) SDelice BisonDDarnelle Maffucci [facility] Pharmacist (Pharmacist)  Recent office visits: 12/06/2021 - Dr. BQuay Burow- no medication changes - follow up in 6 months   Recent consult visits: 10/27/2021 - Dr. ZCoralyn Pear- Ophthalmology - no changes to medications - follow up in 6 months   Hospital visits: None in previous 6 months  Objective:  Lab Results  Component Value Date   CREATININE 0.50 12/06/2021   BUN 17 12/06/2021   GFR 95.43 12/06/2021   GFRNONAA >60 10/05/2015   GFRAA >60 10/05/2015   NA 139 12/06/2021   K 4.4 12/06/2021   CALCIUM 9.6 12/06/2021   CO2 29 12/06/2021   GLUCOSE 102 (H) 12/06/2021    Lab Results  Component Value Date/Time   HGBA1C 6.1 12/06/2021 09:55 AM   HGBA1C 6.2 06/06/2021 10:30 AM   GFR 95.43 12/06/2021 09:55 AM   GFR 92.79 06/06/2021 10:30 AM   MICROALBUR 8.3 (H) 08/22/2015 11:27 AM   MICROALBUR 0.8 04/08/2009 10:49 AM    Last diabetic Eye exam:  Lab Results  Component Value Date/Time   HMDIABEYEEXA No Retinopathy 10/12/2021 12:00 AM    Last diabetic Foot exam:  No results found for: HMDIABFOOTEX   Lab Results  Component Value Date   CHOL 204 (H) 12/06/2021   HDL 62.50 12/06/2021   LDLCALC 106 (H) 06/03/2020   LDLDIRECT 111.0 12/06/2021  TRIG 270.0 (H) 12/06/2021   CHOLHDL 3 12/06/2021    Hepatic Function Latest Ref Rng & Units 12/06/2021 06/06/2021 12/06/2020  Total Protein 6.0 - 8.3 g/dL 7.5 7.9 7.4  Albumin 3.5 - 5.2 g/dL 4.5 4.8 4.5  AST 0 - 37 U/L _0 ALT 0 - 35 U/L 40(H) 35 35  Alk Phosphatase 39 - 117 U/L 106 124(H) 132(H)  Total Bilirubin 0.2 - 1.2 mg/dL 0.9 1.1 0.9  Bilirubin, Direct 0.0 - 0.3 mg/dL - - -    Lab Results  Component Value Date/Time   TSH 1.23  12/06/2020 10:04 AM   TSH 1.92 06/04/2019 08:45 AM    CBC Latest Ref Rng & Units 12/06/2020 06/04/2019 04/04/2018  WBC 4.0 - 10.5 K/uL 5.3 5.3 6.7  Hemoglobin 12.0 - 15.0 g/dL 13.1 13.7 13.9  Hematocrit 36.0 - 46.0 % 40.2 41.8 41.7  Platelets 150.0 - 400.0 K/uL 186.0 183.0 192.0    Lab Results  Component Value Date/Time   VD25OH 36.17 12/06/2021 09:55 AM   VD25OH 36.26 12/06/2020 10:04 AM    Clinical ASCVD: No  The 10-year ASCVD risk score (Arnett DK, et al., 2019) is: 20.4%   Values used to calculate the score:     Age: 71 years     Sex: Female     Is Non-Hispanic African American: No     Diabetic: Yes     Tobacco smoker: No     Systolic Blood Pressure: 962 mmHg     Is BP treated: Yes     HDL Cholesterol: 62.5 mg/dL     Total Cholesterol: 204 mg/dL    Depression screen [facility] 2/9 06/28/2021 02/28/2021 12/06/2020  Decreased Interest 1 0 1  Down, Depressed, Hopeless 0 0 0  PHQ - 2 Score 1 0 1  Altered sleeping 0 - 0  Tired, decreased energy 2 - 1  Change in appetite 0 - 0  Feeling bad or failure about yourself  0 - 0  Trouble concentrating 0 - 0  Moving slowly or fidgety/restless 0 - 0  Suicidal thoughts 0 - 0  PHQ-9 Score 3 - 2  Difficult doing work/chores - - Not difficult at all    Social History   Tobacco Use  Smoking Status Never  Smokeless Tobacco Never   BP Readings from Last 3 Encounters:  12/06/21 128/74  06/06/21 130/74  02/28/21 128/70   Pulse Readings from Last 3 Encounters:  12/06/21 68  06/06/21 67  02/28/21 62   Wt Readings from Last 3 Encounters:  12/06/21 249 lb (112.9 kg)  06/06/21 249 lb (112.9 kg)  02/28/21 255 lb 9.6 oz (115.9 kg)   BMI Readings from Last 3 Encounters:  12/06/21 45.54 kg/m  06/06/21 45.54 kg/m  02/28/21 46.75 kg/m    Assessment/Interventions: Review of patient past medical history, allergies, medications, health status, including review of consultants reports, laboratory and other test data, was performed as part of  comprehensive evaluation and provision of chronic care management services.   SDOH:  (Social Determinants of Health) assessments and interventions performed: Yes  SDOH Screenings   Alcohol Screen: Low Risk    Last Alcohol Screening Score (AUDIT): 0  Depression (PHQ2-9): Low Risk    PHQ-2 Score: 3  Financial Resource Strain: Low Risk    Difficulty of Paying Living Expenses: Not hard at all  Food Insecurity: No Food Insecurity   Worried About Charity fundraiser in the Last Year: Never true   Ran Out of  Food in the Last Year: Never true  Housing: Low Risk    Last Housing Risk Score: 0  Physical Activity: Sufficiently Active   Days of Exercise per Week: 5 days   Minutes of Exercise per Session: 30 min  Social Connections: Moderately Integrated   Frequency of Communication with Friends and Family: More than three times a week   Frequency of Social Gatherings with Friends and Family: Once a week   Attends Religious Services: 1 to 4 times per year   Active Member of [employer] or Organizations: No   Attends Archivist Meetings: 1 to 4 times per year   Marital Status: Widowed  Stress: No Stress Concern Present   Feeling of Stress : Not at all  Tobacco Use: Low Risk    Smoking Tobacco Use: Never   Smokeless Tobacco Use: Never   Passive Exposure: Not on file  Transportation Needs: No Transportation Needs   Lack of Transportation (Medical): No   Lack of Transportation (Non-Medical): No    CCM Care Plan  No Known Allergies  Medications Reviewed Today     Reviewed by Tomasa Blase, [facility] (Pharmacist) on 12/18/21 at 1038  Med List Status: <None>   Medication Order Taking? Sig Documenting Provider Last Dose Status Informant  acetaminophen (TYLENOL) 325 MG tablet [id number] Yes Take 650 mg by mouth every 6 (six) hours as needed. [provider] Taking Active   alendronate (FOSAMAX) 70 MG tablet [id number] Yes Take 1 tablet (70 mg total) by mouth every 7 (seven) days.  Take with a full glass of water on an empty stomach. Binnie Rail, MD Taking Active   amLODipine (NORVASC) 5 MG tablet [id number] Yes TAKE 1 TABLET(5 MG) BY MOUTH EVERY MORNING Burns, Claudina Lick, MD Taking Active   amoxicillin (AMOXIL) 500 MG tablet [id number] Yes TK 4 TS PO 1 HOUR PRIOR TO APPOINTMENT [provider] Taking Active   aspirin 81 MG tablet [id number] Yes Take 81 mg by mouth daily. [provider] Taking Active Self           Med Note Delice Bison, Darnelle Maffucci   Wed Jun 28, 2021 11:16 AM)    Azelastine-Fluticasone 137-50 MCG/ACT SUSP [id number] Yes 1 spray in each nostril [provider] Taking Active   benazepril (LOTENSIN) 20 MG tablet [id number] Yes TAKE 1 TABLET(20 MG) BY MOUTH DAILY Binnie Rail, MD Taking Active   Biotin 10000 MCG TABS [id number] Yes Take 1 tablet by mouth daily. [provider] Taking Active   calcium carbonate (OS-CAL) 1250 (500 Ca) MG chewable tablet [id number] Yes Chew 1 tablet by mouth daily. [provider] Taking Active   cholecalciferol (VITAMIN D) 1000 units tablet [id number] Yes Take 3,000 Units by mouth daily. [provider] Taking Active   Coenzyme Q10 (CO Q-10) 100 MG CAPS [id number] Yes Take 1 capsule by mouth daily. [provider] Taking Active   Collagen-Boron-Hyaluronic Acid ([place]) [id number] Yes Take 1 tablet by mouth daily. [provider] Taking Active   latanoprost (XALATAN) 0.005 % ophthalmic solution [id number] Yes SMARTSIG:1 Drop(s) In Eye(s) Every Evening [provider] Taking Active   levocetirizine (XYZAL) 5 MG tablet [id number] Yes 1 tablet in the evening [provider] Taking Active   montelukast (SINGULAIR) 10 MG tablet [id number] Yes Take 10 mg by mouth at bedtime. [provider] Taking Active   Multiple Vitamins-Minerals (CENTRUM SILVER 50+WOMEN) TABS [id number] Yes Take 1 tablet by mouth daily. [provider]  Taking Active   Multiple Vitamins-Minerals (PRESERVISION AREDS 2 PO) [id number] Yes Take 1 capsule by mouth in the morning and at bedtime. [provider] Taking Active   pravastatin (PRAVACHOL) 80 MG tablet [id number] Yes Take 1 tablet (80 mg total) by mouth daily. Binnie Rail, MD Taking Active   Probiotic Product (PROBIOTIC DAILY PO) [id number] Yes Take 1 capsule by mouth daily. [provider] Taking Active   sertraline (ZOLOFT) 100 MG tablet [id number] Yes TAKE 1 TABLET(100 MG) BY MOUTH DAILY Burns, Claudina Lick, MD Taking Active   triamcinolone ointment (KENALOG) 0.1 % [id number] Yes apply sparingly to affected areas [provider] Taking Active             Patient Active Problem List   Diagnosis Date Noted   Hypertensive retinopathy of both eyes 04/25/2021   Macular degeneration, age related, nonexudative 22/48/2500   Systolic murmur 37/03/8888   Vitamin D deficiency 10/06/2018   Osteoporosis 04/15/2017   Diabetes mellitus without complication ([place]) 16/94/5038   Need for prophylactic antibiotic for dental work 03/06/2016   Osteoarthritis of right shoulder 10/13/2015   OSA (obstructive sleep apnea) 10/04/2015   Allergic rhinitis 08/20/2014   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   Benign paroxysmal positional vertigo 08/19/2013   Hyperlipidemia 09/09/2007   DISORDER, DYSMETABOLIC SYNDROME X 88/28/0034   Depression 09/09/2007   Essential hypertension 09/09/2007    Immunization History  Administered Date(s) Administered   Fluad Quad(high Dose 65+) 12/06/2020   Influenza Split 01/23/2012, 09/08/2014   Influenza Whole 10/02/2008   Influenza, High Dose Seasonal PF 09/26/2017, 10/06/2018, 11/09/2019   Influenza-Unspecified 10/14/2013, 10/01/2015, 09/20/2016, 09/26/2017, 10/26/2021   Pneumococcal Conjugate-13 09/17/2012   Pneumococcal Polysaccharide-23 04/03/2017   Tdap 08/13/2012   Zoster, Live 10/14/2013    Conditions to be addressed/monitored:  Hypertension,  Hyperlipidemia, Depression, and Osteoporosis  Care Plan : CCM Care Plan  Updates made by Tomasa Blase, RPH since 12/18/2021 12:00 AM     Problem: HTN, HLD, Osteoporosis, Depression, Allergic Rhinitis   Priority: High  Onset Date: 06/28/2021     Long-Range Goal: Disease Management   Start Date: 06/28/2021  Expected End Date: 12/18/2022  This Visit's Progress: Not on track  Recent Progress: On track  Priority: High  Note:   Current Barriers:  Unable to independently monitor therapeutic efficacy Unable to achieve control of LDL / osteoporosis    Pharmacist Clinical Goal(s):  Patient will achieve adherence to monitoring guidelines and medication adherence to achieve therapeutic efficacy achieve control of LDL as evidenced by next lipid panel maintain control of blood pressure as evidenced by continued office visit values in range and at home checks at goal  through collaboration with PharmD and provider.   Interventions: 1:1 collaboration with Binnie Rail, MD regarding development and update of comprehensive plan of care as evidenced by provider attestation and co-signature Inter-disciplinary care team collaboration (see longitudinal plan of care) Comprehensive medication review performed; medication list updated in electronic medical record  Hypertension (BP goal <140/90) -Controlled -Current treatment: Benazepril 76m daily  Amlodipine 524mdaily  -Medications previously tried: n/a  -Current home readings: has not been checking at home - Recent office visits:  -Current dietary habits: moderates sodium and caffeine intake  -Current exercise habits: exercises doing chair workouts AM and PM (mobility limited due to knee pain)  - Has been told she is a candidate for knee replacement but must lose weight first, patient notes to weight loss of 30 lbs since making changes to  her diet -Denies hypotensive/hypertensive symptoms -Educated on BP goals and benefits of medications for  prevention of heart attack, stroke and kidney damage; Daily salt intake goal < 2300 mg; Importance of home blood pressure monitoring; Proper BP monitoring technique; -Counseled to monitor BP at home at least every other week, document, and provide log at future appointments -Counseled on diet and exercise extensively Recommended to continue current medication  Hyperlipidemia: (LDL goal < 100) -Not ideally controlled -last LDL 111 mg/dL -Current treatment: Pravastatin 78m daily  Aspirin 833mdaily  -Medications previously tried: n/a  -Current dietary patterns: has been more strict with her diet (recent weight loss of 30 lbs) - moderates fried/ fatty food intake  -Current exercise habits: chair exercises morning and night  -Educated on Cholesterol goals;  Benefits of statin for ASCVD risk reduction; Importance of limiting foods high in cholesterol; Strategies to manage statin-induced myalgias; -Counseled on diet and exercise extensively -Recommending for patient to change from pravastatin to rosuvastatin 2074maily - patient agreeable to plan   Depression(Goal: Mood control/ prevention of disease progression) -Controlled -Current treatment: Sertraline 100m69mily  -Medications previously tried/failed: n/a -PHQ9: 3 -Educated on Benefits of medication for symptom control -Recommended to continue current medication  Osteoporosis (Goal prevention of fractures)  -Not ideally controlled -Last DEXA Scan: 06/07/2021   T-Score right femoral neck: -2.5  T-Score left femoral neck: -1.7  T-Score lumbar spine: -2.0  10-year probability of major osteoporotic fracture: 12%  10-year probability of hip fracture: 2.7%  -Patient is a candidate for pharmacologic treatment due to T-Score < -2.5 in femoral neck -Current treatment  Calcium chewable - 1250mg64mly  Vit D3 - 3000 units daily  Alendronate 70mg 40mtablet weekly  -Medications previously tried: n/a  -Recommend [phone] units of  vitamin D daily. Recommend 1200 mg of calcium daily from dietary and supplemental sources. Counseled on oral bisphosphonate administration: take in the morning, 30 minutes prior to food with 6-8 oz of water. Do not lie down for at least 30 minutes after taking. Recommend weight-bearing and muscle strengthening exercises for building and maintaining bone density. -Recommended for patient to continue current medications - advised to start using alarm on phone to increase medication adherence   Allergic Rhinitis (Goal: Prevention/ Control of Allergies) -Controlled -Current treatment  Levocetirizine 5mg da41m Montelukast 10mg da52m -Medications previously tried: azelastine, fluticasone nose spray, claritin D, claritin  -Recommended to continue current medication  Health Maintenance -Vaccine gaps: n/a -Current therapy:  Preservision AERDS 2 multivitamins - 1 tablet daily  Biotin 10,000 mcg daily  Move Free supplement - 1 tablet daily  Acetaminophen 325mg - 225mlets twice daily as needed  Centrum silver womens 50+ - 1 tablet daily  Probiotic - 1 capsule daily  CoQ10 - 100mg dail7mmoxicillin 500mg - 4 t7mts prior to dental procedure (prophylaxis) -Educated on Herbal supplement research is limited and benefits usually cannot be proven Cost vs benefit of each product must be carefully weighed by individual consumer Supplements may interfere with prescription drugs -Patient is satisfied with current therapy and denies issues -Recommended to continue current medication  Patient Goals/Self-Care Activities Patient will:  - take medications as prescribed -Change from pravastatin to rosuvastatin 20mg daily 49mck blood pressure every other week, document, and provide at future appointments - Prevent falls by ensuring proper footwear, using railings / stabilizing self when needed   Follow Up Plan: Telephone follow up appointment with care management team member scheduled for: 6 months The  patient has been provided with contact information for the care management team and has been advised to call with any health related questions or concerns.          Medication Assistance: None required.  Patient affirms current coverage meets needs.  Compliance/Adherence/Medication fill history: Care Gaps: Foot Exam   Patient's preferred pharmacy is:  [facility] [place], [place] - [address] [address] Phone: [phone] Fax: [phone]  [facility] [place], [place] [facility] [address] [place] [address] Phone: [phone] Fax: [phone]    Uses pill box? Yes Pt endorses 100% compliance  Care Plan and Follow Up Patient Decision:  Patient agrees to Care Plan and Follow-up.  Plan: Telephone follow up appointment with care management team member scheduled for:  4 months  and The patient has been provided with contact information for the care management team and has been advised to call with any health related questions or concerns.   Tomasa Blase, PharmD Clinical Pharmacist, [place]

## 2021-12-18 NOTE — Patient Instructions (Signed)
Visit Information  Following are the goals we discussed today:   Prevent Falls and Broken Bones - Osteoporosis   Timeframe:  Long-Range Goal Priority:  High Start Date:  06/28/2021                          Expected End Date: 12/28/2022                      Follow Up Date 06/28/2022   - always use handrails on the stairs - always wear shoes or slippers with non-slip sole - get at least 10 minutes of activity every day - keep cell phone with me always - make an emergency alert plan in case I fall    Why is this important?   When you fall, there are 3 things that control if a bone breaks or not.  These are the fall itself, how hard and the direction that you fall and how fragile your bones are.  Preventing falls is very important for you because of fragile bones.       Plan: Telephone follow up appointment with care management team member scheduled for:  6 months The patient has been provided with contact information for the care management team and has been advised to call with any health related questions or concerns.   Tomasa Blase, PharmD Clinical Pharmacist, [facility]   Please call the care guide team at [phone] if you need to cancel or reschedule your appointment.   Patient verbalizes understanding of instructions provided today and agrees to view in [place].

## 2021-12-19 ENCOUNTER — Telehealth: Payer: Self-pay | Admitting: Internal Medicine

## 2021-12-19 DIAGNOSIS — E7849 Other hyperlipidemia: Secondary | ICD-10-CM

## 2021-12-19 MED ORDER — ROSUVASTATIN CALCIUM 20 MG PO TABS: 20.0000 mg | ORAL_TABLET | Freq: Every day | ORAL | 3 refills | Status: DC

## 2021-12-19 NOTE — Telephone Encounter (Signed)
Called and left message for patient today.  My-chart message also sent.

## 2021-12-19 NOTE — Telephone Encounter (Signed)
Pt has agreed to switch pravastatin to crestor - new prescription was sent to pharmacy.    Needs blood work scheduled (ordered) in 6-8 weeks to recheck lipids/liver tests.  Please call her to schedule

## 2021-12-30 DIAGNOSIS — M810 Age-related osteoporosis without current pathological fracture: Secondary | ICD-10-CM

## 2021-12-30 DIAGNOSIS — I10 Essential (primary) hypertension: Secondary | ICD-10-CM | POA: Diagnosis not present

## 2021-12-30 DIAGNOSIS — E7849 Other hyperlipidemia: Secondary | ICD-10-CM | POA: Diagnosis not present

## 2022-02-08 ENCOUNTER — Other Ambulatory Visit: Payer: Self-pay

## 2022-02-08 ENCOUNTER — Other Ambulatory Visit (INDEPENDENT_AMBULATORY_CARE_PROVIDER_SITE_OTHER): Payer: Medicare Other

## 2022-02-08 DIAGNOSIS — E7849 Other hyperlipidemia: Secondary | ICD-10-CM

## 2022-02-08 LAB — HEPATIC FUNCTION PANEL
ALT: 20 U/L (ref 0–35)
AST: 18 U/L (ref 0–37)
Albumin: 4.3 g/dL (ref 3.5–5.2)
Alkaline Phosphatase: 79 U/L (ref 39–117)
Bilirubin, Direct: 0.1 mg/dL (ref 0.0–0.3)
Total Bilirubin: 1.1 mg/dL (ref 0.2–1.2)
Total Protein: 7.5 g/dL (ref 6.0–8.3)

## 2022-02-08 LAB — LDL CHOLESTEROL, DIRECT: Direct LDL: 73 mg/dL

## 2022-02-08 LAB — LIPID PANEL
Cholesterol: 160 mg/dL (ref 0–200)
HDL: 54.3 mg/dL (ref >39.00)
NonHDL: 106.1
Total CHOL/HDL Ratio: 3
Triglycerides: 289 mg/dL — ABNORMAL HIGH (ref 0.0–149.0)
VLDL: 57.8 mg/dL — ABNORMAL HIGH (ref 0.0–40.0)

## 2022-02-17 ENCOUNTER — Other Ambulatory Visit: Payer: Self-pay | Admitting: Internal Medicine

## 2022-03-01 ENCOUNTER — Other Ambulatory Visit: Payer: Self-pay

## 2022-03-01 ENCOUNTER — Ambulatory Visit (INDEPENDENT_AMBULATORY_CARE_PROVIDER_SITE_OTHER): Payer: Medicare Other

## 2022-03-01 VITALS — BP 118/70 | HR 60 | Temp 97.8°F | Resp 16 | Ht 62.0 in | Wt 253.8 lb

## 2022-03-01 DIAGNOSIS — Z0000 Encounter for general adult medical examination without abnormal findings: Secondary | ICD-10-CM

## 2022-03-01 NOTE — Patient Instructions (Addendum)
Jane Montgomery , Thank you for taking time to come for your Medicare Wellness Visit. I appreciate your ongoing commitment to your health goals. Please review the following plan we discussed and let me know if I can assist you in the future.   Screening recommendations/referrals: Colonoscopy: 07/14/2018; due every 5 years (due 07/15/2023) Mammogram: 10/07/2021; due every year (due 10/07/2022) Bone Density: 06/07/2021; due every 2 years (due 06/08/2023) Recommended yearly ophthalmology/optometry visit for glaucoma screening and checkup Recommended yearly dental visit for hygiene and checkup  Vaccinations: Influenza vaccine: 10/26/2021 Pneumococcal vaccine: 09/17/2012, 04/03/2017 Tdap vaccine: 08/13/2012; due every 10 years (due 08/13/2022) Shingles vaccine: never done   Covid-19: never done  Advanced directives: Yes; documents on file  Conditions/risks identified: Yes; Client understands the importance of follow-up appointments with providers by attending scheduled visits and discussed goals to eat healthier, increase physical activity 5 times a week for 30 minutes each, exercise the brain by doing stimulating brain exercises (reading, adult coloring, crafting, listening to music, puzzles, etc.), socialize and enjoy life more, get enough sleep at least 8-9 hours average per night and make time for laughter.   Next appointment: Please schedule your next Medicare Wellness Visit with your Nurse Health Advisor in 1 year by calling [phone].   Preventive Care 70 Years and Older, Female Preventive care refers to lifestyle choices and visits with your health care provider that can promote health and wellness. What does preventive care include? A yearly physical exam. This is also called an annual well check. Dental exams once or twice a year. Routine eye exams. Ask your health care provider how often you should have your eyes checked. Personal lifestyle choices, including: Daily care of your teeth and  gums. Regular physical activity. Eating a healthy diet. Avoiding tobacco and drug use. Limiting alcohol use. Practicing safe sex. Taking low-dose aspirin every day. Taking vitamin and mineral supplements as recommended by your health care provider. What happens during an annual well check? The services and screenings done by your health care provider during your annual well check will depend on your age, overall health, lifestyle risk factors, and family history of disease. Counseling  Your health care provider may ask you questions about your: Alcohol use. Tobacco use. Drug use. Emotional well-being. Home and relationship well-being. Sexual activity. Eating habits. History of falls. Memory and ability to understand (cognition). Work and work Statistician. Reproductive health. Screening  You may have the following tests or measurements: Height, weight, and BMI. Blood pressure. Lipid and cholesterol levels. These may be checked every 5 years, or more frequently if you are over 70 years old. Skin check. Lung cancer screening. You may have this screening every year starting at age 70 if you have a 30-pack-year history of smoking and currently smoke or have quit within the past 15 years. Fecal occult blood test (FOBT) of the stool. You may have this test every year starting at age 70. Flexible sigmoidoscopy or colonoscopy. You may have a sigmoidoscopy every 5 years or a colonoscopy every 10 years starting at age 70. Hepatitis C blood test. Hepatitis B blood test. Sexually transmitted disease (STD) testing. Diabetes screening. This is done by checking your blood sugar (glucose) after you have not eaten for a while (fasting). You may have this done every 1-3 years. Bone density scan. This is done to screen for osteoporosis. You may have this done starting at age 70. Mammogram. This may be done every 1-2 years. Talk to your health care provider about how often  you should have regular  mammograms. Talk with your health care provider about your test results, treatment options, and if necessary, the need for more tests. Vaccines  Your health care provider may recommend certain vaccines, such as: Influenza vaccine. This is recommended every year. Tetanus, diphtheria, and acellular pertussis (Tdap, Td) vaccine. You may need a Td booster every 10 years. Zoster vaccine. You may need this after age 70. Pneumococcal 13-valent conjugate (PCV13) vaccine. One dose is recommended after age 70. Pneumococcal polysaccharide (PPSV23) vaccine. One dose is recommended after age 70. Talk to your health care provider about which screenings and vaccines you need and how often you need them. This information is not intended to replace advice given to you by your health care provider. Make sure you discuss any questions you have with your health care provider. Document Released: 01/13/2016 Document Revised: 09/05/2016 Document Reviewed: 10/18/2015 Elsevier Interactive Patient Education  2017 [place] Prevention in the Home Falls can cause injuries. They can happen to people of all ages. There are many things you can do to make your home safe and to help prevent falls. What can I do on the outside of my home? Regularly fix the edges of walkways and driveways and fix any cracks. Remove anything that might make you trip as you walk through a door, such as a raised step or threshold. Trim any bushes or trees on the path to your home. Use bright outdoor lighting. Clear any walking paths of anything that might make someone trip, such as rocks or tools. Regularly check to see if handrails are loose or broken. Make sure that both sides of any steps have handrails. Any raised decks and porches should have guardrails on the edges. Have any leaves, snow, or ice cleared regularly. Use sand or salt on walking paths during winter. Clean up any spills in your garage right away. This includes oil  or grease spills. What can I do in the bathroom? Use night lights. Install grab bars by the toilet and in the tub and shower. Do not use towel bars as grab bars. Use non-skid mats or decals in the tub or shower. If you need to sit down in the shower, use a plastic, non-slip stool. Keep the floor dry. Clean up any water that spills on the floor as soon as it happens. Remove soap buildup in the tub or shower regularly. Attach bath mats securely with double-sided non-slip rug tape. Do not have throw rugs and other things on the floor that can make you trip. What can I do in the bedroom? Use night lights. Make sure that you have a light by your bed that is easy to reach. Do not use any sheets or blankets that are too big for your bed. They should not hang down onto the floor. Have a firm chair that has side arms. You can use this for support while you get dressed. Do not have throw rugs and other things on the floor that can make you trip. What can I do in the kitchen? Clean up any spills right away. Avoid walking on wet floors. Keep items that you use a lot in easy-to-reach places. If you need to reach something above you, use a strong step stool that has a grab bar. Keep electrical cords out of the way. Do not use floor polish or wax that makes floors slippery. If you must use wax, use non-skid floor wax. Do not have throw rugs and other things on  the floor that can make you trip. What can I do with my stairs? Do not leave any items on the stairs. Make sure that there are handrails on both sides of the stairs and use them. Fix handrails that are broken or loose. Make sure that handrails are as long as the stairways. Check any carpeting to make sure that it is firmly attached to the stairs. Fix any carpet that is loose or worn. Avoid having throw rugs at the top or bottom of the stairs. If you do have throw rugs, attach them to the floor with carpet tape. Make sure that you have a light  switch at the top of the stairs and the bottom of the stairs. If you do not have them, ask someone to add them for you. What else can I do to help prevent falls? Wear shoes that: Do not have high heels. Have rubber bottoms. Are comfortable and fit you well. Are closed at the toe. Do not wear sandals. If you use a stepladder: Make sure that it is fully opened. Do not climb a closed stepladder. Make sure that both sides of the stepladder are locked into place. Ask someone to hold it for you, if possible. Clearly mark and make sure that you can see: Any grab bars or handrails. First and last steps. Where the edge of each step is. Use tools that help you move around (mobility aids) if they are needed. These include: Canes. Walkers. Scooters. Crutches. Turn on the lights when you go into a dark area. Replace any light bulbs as soon as they burn out. Set up your furniture so you have a clear path. Avoid moving your furniture around. If any of your floors are uneven, fix them. If there are any pets around you, be aware of where they are. Review your medicines with your doctor. Some medicines can make you feel dizzy. This can increase your chance of falling. Ask your doctor what other things that you can do to help prevent falls. This information is not intended to replace advice given to you by your health care provider. Make sure you discuss any questions you have with your health care provider. Document Released: 10/13/2009 Document Revised: 05/24/2016 Document Reviewed: 01/21/2015 Elsevier Interactive Patient Education  2017 [employer].

## 2022-03-01 NOTE — Progress Notes (Signed)
Subjective:   Jane Montgomery is a 72 y.o. female who presents for Medicare Annual (Subsequent) preventive examination.  Review of Systems     Cardiac Risk Factors include: advanced age (>57men, >78 women);dyslipidemia;family history of premature cardiovascular disease;hypertension;obesity (BMI >30kg/m2)     Objective:    Today's Vitals   03/01/22 1140  BP: 118/70  Pulse: 60  Resp: 16  Temp: 97.8 F (36.6 C)  SpO2: 95%  Weight: 253 lb 12.8 oz (115.1 kg)  Height: 5\' 2"  (1.575 m)  PainSc: 0-No pain   Body mass index is 46.42 kg/m.  Advanced Directives 03/01/2022 02/28/2021 06/04/2019 07/14/2018 04/10/2018 10/14/2015 10/05/2015  Does Patient Have a Medical Advance Directive? Yes Yes No No No No No  Type of Advance Directive Living will;Healthcare Power of [place];Living will - - - - -  Does patient want to make changes to medical advance directive? - No - Patient declined - - Yes (ED - Information included in AVS) - -  Copy of Elias-Fela Solis in Chart? Yes - validated most recent copy scanned in chart (See row information) No - copy requested - - - - -  Would patient like information on creating a medical advance directive? - - No - Patient declined No - Patient declined - Yes - Educational materials given No - patient declined information    Current Medications (verified) Outpatient Encounter Medications as of 03/01/2022  Medication Sig   acetaminophen (TYLENOL) 325 MG tablet Take 650 mg by mouth every 6 (six) hours as needed.   alendronate (FOSAMAX) 70 MG tablet Take 1 tablet (70 mg total) by mouth every 7 (seven) days. Take with a full glass of water on an empty stomach.   amLODipine (NORVASC) 5 MG tablet TAKE 1 TABLET(5 MG) BY MOUTH EVERY MORNING   amoxicillin (AMOXIL) 500 MG tablet TK 4 TS PO 1 HOUR PRIOR TO APPOINTMENT   aspirin 81 MG tablet Take 81 mg by mouth daily.   Azelastine-Fluticasone 137-50 MCG/ACT SUSP 1 spray in each nostril    benazepril (LOTENSIN) 20 MG tablet TAKE 1 TABLET(20 MG) BY MOUTH DAILY   Biotin 10000 MCG TABS Take 1 tablet by mouth daily.   calcium carbonate (OS-CAL) 1250 (500 Ca) MG chewable tablet Chew 1 tablet by mouth daily.   cholecalciferol (VITAMIN D) 1000 units tablet Take 3,000 Units by mouth daily.   Coenzyme Q10 (CO Q-10) 100 MG CAPS Take 1 capsule by mouth daily.   Collagen-Boron-Hyaluronic Acid (MOVE FREE ULTRA JOINT HEALTH PO) Take 1 tablet by mouth daily.   latanoprost (XALATAN) 0.005 % ophthalmic solution SMARTSIG:1 Drop(s) In Eye(s) Every Evening   levocetirizine (XYZAL) 5 MG tablet 1 tablet in the evening   montelukast (SINGULAIR) 10 MG tablet Take 10 mg by mouth at bedtime.   Multiple Vitamins-Minerals (CENTRUM SILVER 50+WOMEN) TABS Take 1 tablet by mouth daily.   Multiple Vitamins-Minerals (PRESERVISION AREDS 2 PO) Take 1 capsule by mouth in the morning and at bedtime.   Probiotic Product (PROBIOTIC DAILY PO) Take 1 capsule by mouth daily.   rosuvastatin (CRESTOR) 20 MG tablet Take 1 tablet (20 mg total) by mouth daily.   sertraline (ZOLOFT) 100 MG tablet TAKE 1 TABLET(100 MG) BY MOUTH DAILY   triamcinolone ointment (KENALOG) 0.1 % apply sparingly to affected areas   No facility-administered encounter medications on file as of 03/01/2022.    Allergies (verified) Patient has no known allergies.   History: Past Medical History:  Diagnosis Date  Allergy    Anisocoria    evaluated by Dr Erling Cruz 2005   Arthritis    Benign positional vertigo    in LLDP   Chronic back pain    reason unknown    Depression    takes Zoloft daily- hormone imbalance - not truly depression per pt    Diarrhea    Diverticulosis    Gestational diabetes 1982, 1984   "pre"   Rosanna Randy syndrome    Heart murmur    Hyperlipidemia    takes Pravastatin daily   Hypertension    takes Amlodipine daily as well as Benazepril   Hypertensive retinopathy    Joint pain    Macular degeneration    early stages and  dry   Nocturia    Osteoporosis    Shortness of breath dyspnea    allergy related and occasionally will take Claritin.   Sleep apnea    has cpap   Tremor, essential    Dr Erling Cruz   Past Surgical History:  Procedure Laterality Date   CATARACT EXTRACTION Bilateral 12/31/2013   CESAREAN SECTION     x 2   COLONOSCOPY  [phone]   Negative, redundant colon; [facility]   DILATION AND CURETTAGE OF UTERUS     Dr Irven [place]   EYE SURGERY     PLANTAR FASCIA SURGERY Bilateral    TONSILLECTOMY     TOTAL SHOULDER ARTHROPLASTY Right 10/13/2015   Procedure: TOTAL SHOULDER ARTHROPLASTY;  Surgeon: [name], MD;  Location: [facility];  Service: Orthopedics;  Laterality: Right;  Right total shoulder arthroplasty   Family History  Problem Relation Age of Onset   Hypertension Father    Heart attack Father 42       CABG    Colon cancer Mother 63   Macular degeneration Mother    Hypertension Mother    Seizures Mother        ? etiology (preceded CVA)   Stroke Mother 25   Osteoporosis Mother    Myelodysplastic syndrome Mother    Colon cancer Maternal Grandmother    Colon cancer Maternal Aunt    Colon cancer Other        2 Maternal Great Aunts   Heart attack Paternal Grandfather        > 42   Heart attack Paternal Aunt 85   Leukemia Paternal Grandmother    Diabetes Neg Hx    Colon polyps Neg Hx    Esophageal cancer Neg Hx    Rectal cancer Neg Hx    Stomach cancer Neg Hx    Social History   Socioeconomic History   Marital status: Widowed    Spouse name: Not on file   Number of children: 2   Years of education: Not on file   Highest education level: Not on file  Occupational History   Not on file  Tobacco Use   Smoking status: Never   Smokeless tobacco: Never  Vaping Use   Vaping Use: Never used  Substance and Sexual Activity   Alcohol use: No   Drug use: No   Sexual activity: Yes    Birth control/protection: Post-menopausal  Other Topics Concern   Not on file  Social  History Narrative   Not on file   Social Determinants of Health   Financial Resource Strain: Low Risk    Difficulty of Paying Living Expenses: Not hard at all  Food Insecurity: No Food Insecurity   Worried About White in the Last Year:  Never true   Ran Out of Food in the Last Year: Never true  Transportation Needs: Not on file  Physical Activity: Not on file  Stress: No Stress Concern Present   Feeling of Stress : Not at all  Social Connections: Moderately Integrated   Frequency of Communication with Friends and Family: More than three times a week   Frequency of Social Gatherings with Friends and Family: Once a week   Attends Religious Services: 1 to 4 times per year   Active Member of [employer] or Organizations: No   Attends Archivist Meetings: 1 to 4 times per year   Marital Status: Widowed    Tobacco Counseling Counseling given: Not Answered   Clinical Intake:  Pre-visit preparation completed: Yes  Pain : No/denies pain Pain Score: 0-No pain     BMI - recorded: 46.42 Nutritional Status: BMI > 30  Obese Nutritional Risks: None Diabetes: No CBG done?: No Did pt. bring in CBG monitor from home?: No  How often do you need to have someone help you when you read instructions, pamphlets, or other written materials from your doctor or pharmacy?: 1 - Never What is the last grade level you completed in school?: HSG  Diabetic? no  Interpreter Needed?: No  Information entered by :: Lisette Abu, LPN   Activities of Daily Living In your present state of health, do you have any difficulty performing the following activities: 03/01/2022  Hearing? N  Vision? N  Difficulty concentrating or making decisions? N  Walking or climbing stairs? N  Dressing or bathing? N  Doing errands, shopping? N  Preparing Food and eating ? N  Using the Toilet? N  In the past six months, have you accidently leaked urine? N  Do you have problems with loss of bowel  control? N  Managing your Medications? N  Managing your Finances? N  Housekeeping or managing your Housekeeping? N  Some recent data might be hidden    Patient Care Team: Binnie Rail, MD as PCP - General (Internal Medicine) Monna Fam, MD as Consulting Physician (Ophthalmology) Delice Bison Darnelle Maffucci, [facility] as Pharmacist (Pharmacist)  Indicate any recent Medical Services you may have received from other than [facility] providers in the past year (date may be approximate).     Assessment:   This is a routine wellness examination for Jane Montgomery.  Hearing/Vision screen No results found.  Dietary issues and exercise activities discussed: Current Exercise Habits: Home exercise routine, Type of exercise: walking;Other - see comments (chair exercises, house work, walking dogs), Time (Minutes): 30, Frequency (Times/Week): 5, Weekly Exercise (Minutes/Week): 150, Intensity: Mild, Exercise limited by: orthopedic condition(s) (right knee pain)   Goals Addressed               This Visit's Progress     DIET - INCREASE WATER INTAKE (pt-stated)        My goal is to stay as independent as possible and try to increase my water intake.      Depression Screen PHQ 2/9 Scores 03/01/2022 06/28/2021 02/28/2021 12/06/2020 06/04/2019 06/04/2019 04/10/2018  PHQ - 2 Score 0 1 0 1 0 0 2  PHQ- 9 Score - 3 - 2 4 - 5    Fall Risk Fall Risk  03/01/2022 02/28/2021 06/03/2020 06/04/2019 06/04/2019  Falls in the past year? 1 0 0 0 0  Number falls in past yr: 0 0 0 0 0  Injury with Fall? 0 0 0 - 0  Risk for fall due to :  No Fall Risks No Fall Risks - Impaired balance/gait;Impaired mobility -  Follow up Falls evaluation completed - Falls evaluation completed Falls prevention discussed -    FALL RISK PREVENTION PERTAINING TO THE HOME:  Any stairs in or around the home? No  If so, are there any without handrails? No  Home free of loose throw rugs in walkways, pet beds, electrical cords, etc? Yes  Adequate lighting in your home to  reduce risk of falls? Yes   ASSISTIVE DEVICES UTILIZED TO PREVENT FALLS:  Life alert? No  Use of a cane, walker or w/c? No  Grab bars in the bathroom? Yes  Shower chair or bench in shower? Yes  Elevated toilet seat or a handicapped toilet? Yes   TIMED UP AND GO:  Was the test performed? Yes .  Length of time to ambulate 10 feet: 8 sec.   Gait steady and fast without use of assistive device  Cognitive Function: Normal cognitive status assessed by direct observation by this Nurse Health Advisor. No abnormalities found.       6CIT Screen 03/01/2022  What Year? 0 points  What month? 0 points  What time? 0 points  Count back from 20 0 points  Months in reverse 0 points  Repeat phrase 0 points  Total Score 0    Immunizations Immunization History  Administered Date(s) Administered   Fluad Quad(high Dose 65+) 12/06/2020   Influenza Split 01/23/2012, 09/08/2014   Influenza Whole 10/02/2008   Influenza, High Dose Seasonal PF 09/26/2017, 10/06/2018, 11/09/2019   Influenza-Unspecified 10/14/2013, 10/01/2015, 09/20/2016, 09/26/2017, 10/26/2021   Pneumococcal Conjugate-13 09/17/2012   Pneumococcal Polysaccharide-23 04/03/2017   Tdap 08/13/2012   Zoster, Live 10/14/2013    TDAP status: Up to date  Flu Vaccine status: Up to date  Pneumococcal vaccine status: Up to date  Covid-19 vaccine status: Declined, Education has been provided regarding the importance of this vaccine but patient still declined. Advised may receive this vaccine at local pharmacy or Health Dept.or vaccine clinic. Aware to provide a copy of the vaccination record if obtained from local pharmacy or Health Dept. Verbalized acceptance and understanding.  Qualifies for Shingles Vaccine? Yes   Zostavax completed Yes   Shingrix Completed?: No.    Education has been provided regarding the importance of this vaccine. Patient has been advised to call insurance company to determine out of pocket expense if they have not  yet received this vaccine. Advised may also receive vaccine at local pharmacy or Health Dept. Verbalized acceptance and understanding.  Screening Tests Health Maintenance  Topic Date Due   Zoster Vaccines- Shingrix (1 of 2) Never done   FOOT EXAM  10/07/2019   HEMOGLOBIN A1C  06/06/2022   TETANUS/TDAP  08/13/2022   OPHTHALMOLOGY EXAM  10/12/2022   DEXA SCAN  06/08/2023   COLONOSCOPY (Pts 45-63yrs Insurance coverage will need to be confirmed)  07/15/2023   MAMMOGRAM  10/08/2023   Pneumonia Vaccine 68+ Years old  Completed   INFLUENZA VACCINE  Completed   Hepatitis C Screening  Completed   HPV VACCINES  Aged Out   COVID-19 Vaccine  Discontinued    Health Maintenance  Health Maintenance Due  Topic Date Due   Zoster Vaccines- Shingrix (1 of 2) Never done   FOOT EXAM  10/07/2019    Colorectal cancer screening: Type of screening: Colonoscopy. Completed 07/14/2018. Repeat every 5 years  Mammogram status: Completed 10/07/2021. Repeat every year  Bone Density status: Completed 06/07/2021. Results reflect: Bone density results: OSTEOPOROSIS. Repeat every  2 years.  Lung Cancer Screening: (Low Dose CT Chest recommended if Age 26-80 years, 30 pack-year currently smoking OR have quit w/in 15years.) does not qualify.   Lung Cancer Screening Referral: no  Additional Screening:  Hepatitis C Screening: does qualify; Completed yes  Vision Screening: Recommended annual ophthalmology exams for early detection of glaucoma and other disorders of the eye. Is the patient up to date with their annual eye exam?  Yes  Who is the provider or what is the name of the office in which the patient attends annual eye exams? Monna Fam, MD. And Bernarda Caffey, MD. If pt is not established with a provider, would they like to be referred to a provider to establish care? No .   Dental Screening: Recommended annual dental exams for proper oral hygiene  Community Resource Referral / Chronic Care  Management: CRR required this visit?  No   CCM required this visit?  No      Plan:     I have personally reviewed and noted the following in the patients chart:   Medical and social history Use of alcohol, tobacco or illicit drugs  Current medications and supplements including opioid prescriptions.  Functional ability and status Nutritional status Physical activity Advanced directives List of other physicians Hospitalizations, surgeries, and ER visits in previous 12 months Vitals Screenings to include cognitive, depression, and falls Referrals and appointments  In addition, I have reviewed and discussed with patient certain preventive protocols, quality metrics, and best practice recommendations. A written personalized care plan for preventive services as well as general preventive health recommendations were provided to patient.     Sheral Flow, LPN   [id number]/3888   Nurse Notes:  Hearing Screening - Comments:: Patient denied any hearing difficulty.   No hearing aids.  Vision Screening - Comments:: Patient wears corrective glasses/contacts.  Eye exam done annually by: Hosie Poisson, MD and Bernarda Caffey, MD.

## 2022-04-27 ENCOUNTER — Encounter (INDEPENDENT_AMBULATORY_CARE_PROVIDER_SITE_OTHER): Payer: Medicare Other | Admitting: Ophthalmology

## 2022-04-30 NOTE — Progress Notes (Signed)
[facility] - Clinic Note  05/09/2022     CHIEF COMPLAINT Patient presents for Retina Follow Up    HISTORY OF PRESENT ILLNESS: Jane Montgomery is a 72 y.o. female who presents to the clinic today for:   HPI     Retina Follow Up   Patient presents with  Dry AMD.  In both eyes.  This started 6 months ago.  I, the attending physician,  performed the HPI with the patient and updated documentation appropriately.        Comments   Patient here for 6 months retina follow up for non exu ARMD OU. Patient states vision about the same. No worse. No eye pain.       Last edited by Rennis Chris, MD on 05/09/2022  9:19 PM.    Pt states vision is stable   Referring physician: Pincus Sanes, MD [address] [place],  [place] [address]  HISTORICAL INFORMATION:   Selected notes from the MEDICAL RECORD NUMBER Referred by Dr. Alben Spittle for eval of GA paracentral mac   CURRENT MEDICATIONS: Current Outpatient Medications (Ophthalmic Drugs)  Medication Sig   latanoprost (XALATAN) 0.005 % ophthalmic solution SMARTSIG:1 Drop(s) In Eye(s) Every Evening   No current facility-administered medications for this visit. (Ophthalmic Drugs)   Current Outpatient Medications (Other)  Medication Sig   acetaminophen (TYLENOL) 325 MG tablet Take 650 mg by mouth every 6 (six) hours as needed.   alendronate (FOSAMAX) 70 MG tablet Take 1 tablet (70 mg total) by mouth every 7 (seven) days. Take with a full glass of water on an empty stomach.   amLODipine (NORVASC) 5 MG tablet TAKE 1 TABLET(5 MG) BY MOUTH EVERY MORNING   amoxicillin (AMOXIL) 500 MG tablet TK 4 TS PO 1 HOUR PRIOR TO APPOINTMENT   aspirin 81 MG tablet Take 81 mg by mouth daily.   Azelastine-Fluticasone 137-50 MCG/ACT SUSP 1 spray in each nostril   benazepril (LOTENSIN) 20 MG tablet TAKE 1 TABLET(20 MG) BY MOUTH DAILY   Biotin 55732 MCG TABS Take 1 tablet by mouth daily.   calcium carbonate (OS-CAL) 1250 (500 Ca) MG  chewable tablet Chew 1 tablet by mouth daily.   cholecalciferol (VITAMIN D) 1000 units tablet Take 3,000 Units by mouth daily.   Coenzyme Q10 (CO Q-10) 100 MG CAPS Take 1 capsule by mouth daily.   Collagen-Boron-Hyaluronic Acid (MOVE FREE ULTRA JOINT HEALTH PO) Take 1 tablet by mouth daily.   levocetirizine (XYZAL) 5 MG tablet 1 tablet in the evening   montelukast (SINGULAIR) 10 MG tablet Take 10 mg by mouth at bedtime.   Multiple Vitamins-Minerals (CENTRUM SILVER 50+WOMEN) TABS Take 1 tablet by mouth daily.   Multiple Vitamins-Minerals (PRESERVISION AREDS 2 PO) Take 1 capsule by mouth in the morning and at bedtime.   Probiotic Product (PROBIOTIC DAILY PO) Take 1 capsule by mouth daily.   rosuvastatin (CRESTOR) 20 MG tablet Take 1 tablet (20 mg total) by mouth daily.   sertraline (ZOLOFT) 100 MG tablet TAKE 1 TABLET(100 MG) BY MOUTH DAILY   triamcinolone ointment (KENALOG) 0.1 % apply sparingly to affected areas   No current facility-administered medications for this visit. (Other)   REVIEW OF SYSTEMS: ROS   Positive for: Gastrointestinal, Neurological, Musculoskeletal, Endocrine, Cardiovascular, Eyes, Respiratory Negative for: Constitutional, Skin, Genitourinary, HENT, Psychiatric, Allergic/Imm, Heme/Lymph Last edited by Laddie Aquas, COA on 05/09/2022  9:43 AM.      ALLERGIES No Known Allergies  PAST MEDICAL HISTORY Past Medical  History:  Diagnosis Date   Allergy    Anisocoria    evaluated by Dr Sandria Manly 2005   Arthritis    Benign positional vertigo    in LLDP   Chronic back pain    reason unknown    Depression    takes Zoloft daily- hormone imbalance - not truly depression per pt    Diarrhea    Diverticulosis    Gestational diabetes 1982, 1984   "pre"   Sullivan Lone syndrome    Heart murmur    Hyperlipidemia    takes Pravastatin daily   Hypertension    takes Amlodipine daily as well as Benazepril   Hypertensive retinopathy    Joint pain    Macular degeneration     early stages and dry   Nocturia    Osteoporosis    Shortness of breath dyspnea    allergy related and occasionally will take Claritin.   Sleep apnea    has cpap   Tremor, essential    Dr Sandria Manly   Past Surgical History:  Procedure Laterality Date   CATARACT EXTRACTION Bilateral 12/31/2013   CESAREAN SECTION     x 2   COLONOSCOPY  [phone]   Negative, redundant colon; [facility]   DILATION AND CURETTAGE OF UTERUS     Dr Rosalio Macadamia   EYE SURGERY     PLANTAR FASCIA SURGERY Bilateral    TONSILLECTOMY     TOTAL SHOULDER ARTHROPLASTY Right 10/13/2015   Procedure: TOTAL SHOULDER ARTHROPLASTY;  Surgeon: Jones Broom, MD;  Location: [facility] OR;  Service: Orthopedics;  Laterality: Right;  Right total shoulder arthroplasty    FAMILY HISTORY Family History  Problem Relation Age of Onset   Hypertension Father    Heart attack Father 58       CABG    Colon cancer Mother 69   Macular degeneration Mother    Hypertension Mother    Seizures Mother        ? etiology (preceded CVA)   Stroke Mother 39   Osteoporosis Mother    Myelodysplastic syndrome Mother    Colon cancer Maternal Grandmother    Colon cancer Maternal Aunt    Colon cancer Other        2 Maternal Great Aunts   Heart attack Paternal Grandfather        > 55   Heart attack Paternal Aunt 72   Leukemia Paternal Grandmother    Diabetes Neg Hx    Colon polyps Neg Hx    Esophageal cancer Neg Hx    Rectal cancer Neg Hx    Stomach cancer Neg Hx    SOCIAL HISTORY Social History   Tobacco Use   Smoking status: Never   Smokeless tobacco: Never  Vaping Use   Vaping Use: Never used  Substance Use Topics   Alcohol use: No   Drug use: No       OPHTHALMIC EXAM: Base Eye Exam     Visual Acuity (Snellen - Linear)       Right Left   Dist cc 20/30 -1 20/40   Dist ph cc 20/30 +2 NI    Correction: Glasses         Tonometry (Tonopen, 9:40 AM)       Right Left   Pressure 16 17         Pupils       Dark  Light Shape React APD   Right 3 2 Round Slow None   Left 4 3 Round Brisk  None         Visual Fields (Counting fingers)       Left Right    Full Full         Extraocular Movement       Right Left    Full, Ortho Full, Ortho         Neuro/Psych     Oriented x3: Yes   Mood/Affect: Normal         Dilation     Both eyes: 1.0% Mydriacyl, 2.5% Phenylephrine @ 9:40 AM           Slit Lamp and Fundus Exam     Slit Lamp Exam       Right Left   Lids/Lashes Dermatochalasis - upper lid Dermatochalasis - upper lid   Conjunctiva/Sclera White and quiet White and quiet   Cornea arcus, well healed cataract wounds arcus, well healed cataract wounds   Anterior Chamber Deep and quiet Deep and quiet   Iris Round and dilated Round and dilated, +PPM   Lens PC IOL in good position with open PC PC IOL in good position   Anterior Vitreous Vitreous syneresis, Posterior vitreous detachment, mild vitreous condensations Vitreous syneresis, Posterior vitreous detachment, vitreous condensations         Fundus Exam       Right Left   Disc mild Pallor, Sharp rim, mild PPA mild Pallor, Sharp rim, mild PPA   C/D Ratio 0.5 0.6   Macula Flat, Blunted foveal reflex, RPE mottling and clumping, Drusen, focal round area of atrophy IN to fovea -- slightly increased, No heme or edema Flat, Blunted foveal reflex, RPE mottling and clumping, diffuse early atrophy, No heme or edema   Vessels attenuated attenuated, mild mild tortuosity   Periphery Attached, scattered pavingstone degeneration, mild reticular degeneration, No heme Attached, scattered pavingstone degeneration, mild reticular degeneration, No heme           Refraction     Wearing Rx       Sphere Cylinder Axis Add   Right -2.50 +0.75 003 +2.50   Left -0.50 +0.50 163 +2.50            IMAGING AND PROCEDURES  Imaging and Procedures for 05/09/2022  OCT, Retina - OU - Both Eyes       Right Eye Quality was good. Central  Foveal Thickness: 269. Progression has worsened. Findings include normal foveal contour, no IRF, no SRF, outer retinal atrophy, retinal drusen (Diffuse ORA, focal patch of severe thinning IN mac -- mild interval increase).   Left Eye Quality was good. Central Foveal Thickness: 272. Progression has been stable. Findings include no SRF, outer retinal atrophy, retinal drusen , normal foveal contour, no IRF (stable improvement in central cystic changes).   Notes *Images captured and stored on drive  Diagnosis / Impression:  Non-exudative ARMD OU Drusen and ORA OU OD: Diffuse ORA, focal patch of severe thinning IN mac -- mild interval increase OS: stable improvement in central cystic changes  Clinical management:  See below  Abbreviations: NFP - Normal foveal profile. CME - cystoid macular edema. PED - pigment epithelial detachment. IRF - intraretinal fluid. SRF - subretinal fluid. EZ - ellipsoid zone. ERM - epiretinal membrane. ORA - outer retinal atrophy. ORT - outer retinal tubulation. SRHM - subretinal hyper-reflective material. IRHM - intraretinal hyper-reflective material            ASSESSMENT/PLAN:    ICD-10-CM   1. Intermediate stage nonexudative age-related macular degeneration of both  eyes  H35.3132 OCT, Retina - OU - Both Eyes    2. 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 Essential hypertension  I10     4. Hypertensive retinopathy of both eyes  H35.033     5. Pseudophakia, both eyes  Z96.1      1-3. 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 Hypertensive retinopathy OU - discussed importance of tight BP control - monitor  6. Pseudophakia OU  - s/p CE/IOL OU  - IOsL in good position, doing well  - monitor   Ophthalmic Meds Ordered this visit:  No orders of the defined types were placed in this encounter.     Return in about 6 months (around 11/09/2022) for f/u non-exu ARMD OU, DFE, OCT.  There are no Patient Instructions on file for this visit.   Explained the diagnoses, plan, and follow up with the patient and they expressed understanding.  Patient expressed understanding of the importance of proper follow up care.   This document serves as a record of services personally performed by Karie Chimera, MD, PhD. It was created on their behalf by De Blanch, an ophthalmic technician. The creation of this record is the provider's dictation and/or activities during the visit.    Electronically signed by: De Blanch, OA, 05/09/22  9:20 PM   Karie Chimera, M.D., Ph.D. Diseases & Surgery of the Retina and Vitreous [facility]  I have reviewed the above documentation for accuracy and completeness, and I agree with the above. Karie Chimera, M.D., Ph.D. 05/09/22 9:22 PM   Abbreviations: M myopia (nearsighted); A astigmatism; H hyperopia (farsighted); P presbyopia; Mrx spectacle prescription;  CTL contact lenses; OD right eye; OS left eye; OU both eyes  XT exotropia; ET esotropia; PEK punctate epithelial keratitis; PEE punctate epithelial erosions; DES dry eye syndrome; MGD meibomian gland dysfunction; ATs artificial  tears; PFAT's preservative free artificial tears; NSC nuclear sclerotic cataract; PSC posterior subcapsular cataract; ERM epi-retinal membrane; PVD posterior vitreous detachment; RD retinal detachment; DM diabetes mellitus; DR diabetic retinopathy; NPDR non-proliferative diabetic retinopathy; PDR proliferative diabetic retinopathy; CSME clinically significant macular edema; DME diabetic macular edema; dbh dot blot hemorrhages; CWS cotton wool spot; POAG primary open angle glaucoma; C/D cup-to-disc ratio; HVF humphrey visual field; GVF goldmann visual field; OCT optical coherence tomography; IOP intraocular pressure; BRVO Branch retinal vein occlusion; CRVO central retinal vein occlusion; CRAO central retinal artery occlusion; BRAO branch retinal artery occlusion; RT retinal tear; SB scleral buckle; PPV pars plana vitrectomy; VH Vitreous hemorrhage; PRP panretinal laser photocoagulation; IVK intravitreal kenalog; VMT vitreomacular traction; MH Macular hole;  NVD neovascularization of the  disc; NVE neovascularization elsewhere; AREDS age related eye disease study; ARMD age related macular degeneration; POAG primary open angle glaucoma; EBMD epithelial/anterior basement membrane dystrophy; ACIOL anterior chamber intraocular lens; IOL intraocular lens; PCIOL posterior chamber intraocular lens; Phaco/IOL phacoemulsification with intraocular lens placement; PRK photorefractive keratectomy; LASIK laser assisted in situ keratomileusis; HTN hypertension; DM diabetes mellitus; COPD chronic obstructive pulmonary disease

## 2022-05-09 ENCOUNTER — Ambulatory Visit (INDEPENDENT_AMBULATORY_CARE_PROVIDER_SITE_OTHER): Payer: Medicare Other | Admitting: Ophthalmology

## 2022-05-09 ENCOUNTER — Encounter (INDEPENDENT_AMBULATORY_CARE_PROVIDER_SITE_OTHER): Payer: Self-pay | Admitting: Ophthalmology

## 2022-05-09 DIAGNOSIS — H353132 Nonexudative age-related macular degeneration, bilateral, intermediate dry stage: Secondary | ICD-10-CM

## 2022-05-09 DIAGNOSIS — I10 Essential (primary) hypertension: Secondary | ICD-10-CM | POA: Diagnosis not present

## 2022-05-09 DIAGNOSIS — H35383 Toxic maculopathy, bilateral: Secondary | ICD-10-CM

## 2022-05-09 DIAGNOSIS — H35033 Hypertensive retinopathy, bilateral: Secondary | ICD-10-CM

## 2022-05-09 DIAGNOSIS — Z961 Presence of intraocular lens: Secondary | ICD-10-CM

## 2022-05-18 ENCOUNTER — Other Ambulatory Visit: Payer: Self-pay | Admitting: Internal Medicine

## 2022-06-05 ENCOUNTER — Encounter: Payer: Self-pay | Admitting: Internal Medicine

## 2022-06-05 NOTE — Patient Instructions (Addendum)
Blood work was ordered.     Medications changes include :   Take vitamin K 50-100 mcg daily if this is not included in your current vitamins     Return in about 6 months (around 12/06/2022) for follow up.   Health Maintenance, Female Adopting a healthy lifestyle and getting preventive care are important in promoting health and wellness. Ask your health care provider about: The right schedule for you to have regular tests and exams. Things you can do on your own to prevent diseases and keep yourself healthy. What should I know about diet, weight, and exercise? Eat a healthy diet  Eat a diet that includes plenty of vegetables, fruits, low-fat dairy products, and lean protein. Do not eat a lot of foods that are high in solid fats, added sugars, or sodium. Maintain a healthy weight Body mass index (BMI) is used to identify weight problems. It estimates body fat based on height and weight. Your health care provider can help determine your BMI and help you achieve or maintain a healthy weight. Get regular exercise Get regular exercise. This is one of the most important things you can do for your health. Most adults should: Exercise for at least 150 minutes each week. The exercise should increase your heart rate and make you sweat (moderate-intensity exercise). Do strengthening exercises at least twice a week. This is in addition to the moderate-intensity exercise. Spend less time sitting. Even light physical activity can be beneficial. Watch cholesterol and blood lipids Have your blood tested for lipids and cholesterol at 70 years of age, then have this test every 5 years. Have your cholesterol levels checked more often if: Your lipid or cholesterol levels are high. You are older than 70 years of age. You are at high risk for heart disease. What should I know about cancer screening? Depending on your health history and family history, you may need to have cancer screening at  various ages. This may include screening for: Breast cancer. Cervical cancer. Colorectal cancer. Skin cancer. Lung cancer. What should I know about heart disease, diabetes, and high blood pressure? Blood pressure and heart disease High blood pressure causes heart disease and increases the risk of stroke. This is more likely to develop in people who have high blood pressure readings or are overweight. Have your blood pressure checked: Every 3-5 years if you are 60-57 years of age. Every year if you are 37 years old or older. Diabetes Have regular diabetes screenings. This checks your fasting blood sugar level. Have the screening done: Once every three years after age 22 if you are at a normal weight and have a low risk for diabetes. More often and at a younger age if you are overweight or have a high risk for diabetes. What should I know about preventing infection? Hepatitis B If you have a higher risk for hepatitis B, you should be screened for this virus. Talk with your health care provider to find out if you are at risk for hepatitis B infection. Hepatitis C Testing is recommended for: Everyone born from [birth year] through 1963. Anyone with known risk factors for hepatitis C. Sexually transmitted infections (STIs) Get screened for STIs, including gonorrhea and chlamydia, if: You are sexually active and are younger than 70 years of age. You are older than 70 years of age and your health care provider tells you that you are at risk for this type of infection. Your sexual activity has changed since you were last  screened, and you are at increased risk for chlamydia or gonorrhea. Ask your health care provider if you are at risk. Ask your health care provider about whether you are at high risk for HIV. Your health care provider may recommend a prescription medicine to help prevent HIV infection. If you choose to take medicine to prevent HIV, you should first get tested for HIV. You should then be  tested every 3 months for as long as you are taking the medicine. Pregnancy If you are about to stop having your period (premenopausal) and you may become pregnant, seek counseling before you get pregnant. Take 400 to 800 micrograms (mcg) of folic acid every day if you become pregnant. Ask for birth control (contraception) if you want to prevent pregnancy. Osteoporosis and menopause Osteoporosis is a disease in which the bones lose minerals and strength with aging. This can result in bone fractures. If you are 108 years old or older, or if you are at risk for osteoporosis and fractures, ask your health care provider if you should: Be screened for bone loss. Take a calcium or vitamin D supplement to lower your risk of fractures. Be given hormone replacement therapy (HRT) to treat symptoms of menopause. Follow these instructions at home: Alcohol use Do not drink alcohol if: Your health care provider tells you not to drink. You are pregnant, may be pregnant, or are planning to become pregnant. If you drink alcohol: Limit how much you have to: 0-1 drink a day. Know how much alcohol is in your drink. In the [place] one drink equals one 12 oz bottle of beer (355 mL), one 5 oz glass of wine (148 mL), or one 1 oz glass of hard liquor (44 mL). Lifestyle Do not use any products that contain nicotine or tobacco. These products include cigarettes, chewing tobacco, and vaping devices, such as e-cigarettes. If you need help quitting, ask your health care provider. Do not use street drugs. Do not share needles. Ask your health care provider for help if you need support or information about quitting drugs. General instructions Schedule regular health, dental, and eye exams. Stay current with your vaccines. Tell your health care provider if: You often feel depressed. You have ever been abused or do not feel safe at home. Summary Adopting a healthy lifestyle and getting preventive care are important in  promoting health and wellness. Follow your health care provider's instructions about healthy diet, exercising, and getting tested or screened for diseases. Follow your health care provider's instructions on monitoring your cholesterol and blood pressure. This information is not intended to replace advice given to you by your health care provider. Make sure you discuss any questions you have with your health care provider. Document Revised: 05/08/2021 Document Reviewed: 05/08/2021 Elsevier Patient Education  [place].

## 2022-06-05 NOTE — Progress Notes (Unsigned)
Subjective:    Patient ID: Jane Montgomery, female    DOB: [birth year], 72 y.o.   MRN: [id number]      HPI Jane Montgomery is here for  Chief Complaint  Patient presents with   Annual Exam    Red spots - looks like pimples.  She just noticed them this week on her arms, bottom of left foot.  They do not hurt or itch. The only thing that is new is CBD cream on her knee and ankles.    Having intermittent dizziness from right ear - when she moves in bed certain waves or lays on back it bothers her.  She is ok during the day.  It has gotten better - started 2-3 weeks ago.   Medications and allergies reviewed with patient and updated if appropriate.  Current Outpatient Medications on File Prior to Visit  Medication Sig Dispense Refill   acetaminophen (TYLENOL) 325 MG tablet Take 650 mg by mouth every 6 (six) hours as needed.     alendronate (FOSAMAX) 70 MG tablet Take 1 tablet (70 mg total) by mouth every 7 (seven) days. Take with a full glass of water on an empty stomach. 4 tablet 11   amLODipine (NORVASC) 5 MG tablet TAKE 1 TABLET(5 MG) BY MOUTH EVERY MORNING 90 tablet 1   amoxicillin (AMOXIL) 500 MG tablet TK 4 TS PO 1 HOUR PRIOR TO APPOINTMENT  0   aspirin 81 MG tablet Take 81 mg by mouth daily.     Azelastine-Fluticasone 137-50 MCG/ACT SUSP 1 spray in each nostril     benazepril (LOTENSIN) 20 MG tablet TAKE 1 TABLET(20 MG) BY MOUTH DAILY 90 tablet 1   Biotin 10000 MCG TABS Take 1 tablet by mouth daily.     calcium carbonate (OS-CAL) 1250 (500 Ca) MG chewable tablet Chew 1 tablet by mouth daily.     cholecalciferol (VITAMIN D) 1000 units tablet Take 3,000 Units by mouth daily.     Coenzyme Q10 (CO Q-10) 100 MG CAPS Take 1 capsule by mouth daily.     Collagen-Boron-Hyaluronic Acid (MOVE FREE ULTRA JOINT HEALTH PO) Take 1 tablet by mouth daily.     desloratadine (CLARINEX) 5 MG tablet Take 5 mg by mouth daily.     latanoprost (XALATAN) 0.005 % ophthalmic solution SMARTSIG:1 Drop(s) In Eye(s)  Every Evening     montelukast (SINGULAIR) 10 MG tablet Take 10 mg by mouth at bedtime.     Multiple Vitamins-Minerals (CENTRUM SILVER 50+WOMEN) TABS Take 1 tablet by mouth daily.     Multiple Vitamins-Minerals (PRESERVISION AREDS 2 PO) Take 1 capsule by mouth in the morning and at bedtime.     Probiotic Product (PROBIOTIC DAILY PO) Take 1 capsule by mouth daily.     rosuvastatin (CRESTOR) 20 MG tablet Take 1 tablet (20 mg total) by mouth daily. 90 tablet 3   sertraline (ZOLOFT) 100 MG tablet TAKE 1 TABLET(100 MG) BY MOUTH DAILY 90 tablet 1   triamcinolone ointment (KENALOG) 0.1 % apply sparingly to affected areas     No current facility-administered medications on file prior to visit.    Review of Systems  Constitutional:  Negative for chills and fever.  Eyes:  Negative for visual disturbance.  Respiratory:  Negative for cough, shortness of breath and wheezing.   Cardiovascular:  Negative for chest pain, palpitations and leg swelling.  Gastrointestinal:  Negative for abdominal pain, blood in stool, constipation, diarrhea and nausea.       No gerd  Genitourinary:  Negative for dysuria.  Musculoskeletal:  Positive for arthralgias (ankles worse, right knee). Negative for back pain.  Skin:  Negative for rash.  Neurological:  Positive for dizziness (x 2-3 weeks- right side). Negative for light-headedness and headaches (ache behind left ear in evening - goes away on own).  Psychiatric/Behavioral:  Negative for dysphoric mood. The patient is not nervous/anxious.       Objective:   Vitals:   06/06/22 0937  BP: 130/70  Pulse: 78  Temp: 97.7 F (36.5 C)  SpO2: 95%   Filed Weights   06/06/22 0937  Weight: 253 lb (114.8 kg)   Body mass index is 46.27 kg/m.  BP Readings from Last 3 Encounters:  06/06/22 130/70  03/01/22 118/70  12/06/21 128/74    Wt Readings from Last 3 Encounters:  06/06/22 253 lb (114.8 kg)  03/01/22 253 lb 12.8 oz (115.1 kg)  12/06/21 249 lb (112.9 kg)        Physical Exam Constitutional: She appears well-developed and well-nourished. No distress.  HENT:  Head: Normocephalic and atraumatic.  Right Ear: External ear normal. Normal ear canal and TM Left Ear: External ear normal.  Normal ear canal and TM Mouth/Throat: Oropharynx is clear and moist.  Eyes: Conjunctivae normal.  Neck: Neck supple. No tracheal deviation present. No thyromegaly present.  No carotid bruit  Cardiovascular: Normal rate, regular rhythm and normal heart sounds.   2/6 systolic murmur heard.  No edema. Pulmonary/Chest: Effort normal and breath sounds normal. No respiratory distress. She has no wheezes. She has no rales.  Breast: deferred   Abdominal: Soft. She exhibits no distension. There is no tenderness.  Lymphadenopathy: She has no cervical adenopathy.  Skin: Skin is warm and dry. She is not diaphoretic.  Several red papules on arms - randomly distrusted Psychiatric: She has a normal mood and affect. Her behavior is normal.     Lab Results  Component Value Date   WBC 5.3 12/06/2020   HGB 13.1 12/06/2020   HCT 40.2 12/06/2020   PLT 186.0 12/06/2020   GLUCOSE 102 (H) 12/06/2021   CHOL 160 02/08/2022   TRIG 289.0 (H) 02/08/2022   HDL 54.30 02/08/2022   LDLDIRECT 73.0 02/08/2022   LDLCALC 106 (H) 06/03/2020   ALT 20 02/08/2022   AST 18 02/08/2022   NA 139 12/06/2021   K 4.4 12/06/2021   CL 101 12/06/2021   CREATININE 0.50 12/06/2021   BUN 17 12/06/2021   CO2 29 12/06/2021   TSH 1.23 12/06/2020   INR 0.98 10/04/2015   HGBA1C 6.1 12/06/2021   MICROALBUR 8.3 (H) 08/22/2015         Assessment & Plan:   Physical exam: Screening blood work  ordered Exercise   not regular - chair exercises  Weight  encouraged weight loss Substance abuse  none   Reviewed recommended immunizations.   Health Maintenance  Topic Date Due   Zoster Vaccines- Shingrix (1 of 2) Never done   FOOT EXAM  10/07/2019   HEMOGLOBIN A1C  06/06/2022   INFLUENZA  VACCINE  07/31/2022   TETANUS/TDAP  08/13/2022   OPHTHALMOLOGY EXAM  10/12/2022   DEXA SCAN  06/08/2023   COLONOSCOPY (Pts 45-9yr Insurance coverage will need to be confirmed)  07/15/2023   MAMMOGRAM  10/08/2023   Pneumonia Vaccine 70 Years old  Completed   Hepatitis C Screening  Completed   HPV VACCINES  Aged Out   COVID-19 Vaccine  Discontinued  See Problem List for Assessment and Plan of chronic medical problems.

## 2022-06-06 ENCOUNTER — Ambulatory Visit (INDEPENDENT_AMBULATORY_CARE_PROVIDER_SITE_OTHER): Payer: Medicare Other | Admitting: Internal Medicine

## 2022-06-06 VITALS — BP 130/70 | HR 78 | Temp 97.7°F | Ht 62.0 in | Wt 253.0 lb

## 2022-06-06 DIAGNOSIS — M810 Age-related osteoporosis without current pathological fracture: Secondary | ICD-10-CM | POA: Diagnosis not present

## 2022-06-06 DIAGNOSIS — E559 Vitamin D deficiency, unspecified: Secondary | ICD-10-CM

## 2022-06-06 DIAGNOSIS — R42 Dizziness and giddiness: Secondary | ICD-10-CM

## 2022-06-06 DIAGNOSIS — E119 Type 2 diabetes mellitus without complications: Secondary | ICD-10-CM

## 2022-06-06 DIAGNOSIS — E7849 Other hyperlipidemia: Secondary | ICD-10-CM

## 2022-06-06 DIAGNOSIS — Z0000 Encounter for general adult medical examination without abnormal findings: Secondary | ICD-10-CM

## 2022-06-06 DIAGNOSIS — R21 Rash and other nonspecific skin eruption: Secondary | ICD-10-CM

## 2022-06-06 DIAGNOSIS — F3289 Other specified depressive episodes: Secondary | ICD-10-CM

## 2022-06-06 DIAGNOSIS — I10 Essential (primary) hypertension: Secondary | ICD-10-CM | POA: Diagnosis not present

## 2022-06-06 LAB — CBC WITH DIFFERENTIAL/PLATELET
Basophils Absolute: 0 10*3/uL (ref 0.0–0.1)
Basophils Relative: 0.4 % (ref 0.0–3.0)
Eosinophils Absolute: 0.2 10*3/uL (ref 0.0–0.7)
Eosinophils Relative: 2.4 % (ref 0.0–5.0)
HCT: 40.5 % (ref 36.0–46.0)
Hemoglobin: 13.2 g/dL (ref 12.0–15.0)
Lymphocytes Relative: 27.3 % (ref 12.0–46.0)
Lymphs Abs: 1.8 10*3/uL (ref 0.7–4.0)
MCHC: 32.6 g/dL (ref 30.0–36.0)
MCV: 86.9 fl (ref 78.0–100.0)
Monocytes Absolute: 0.3 10*3/uL (ref 0.1–1.0)
Monocytes Relative: 5.2 % (ref 3.0–12.0)
Neutro Abs: 4.2 10*3/uL (ref 1.4–7.7)
Neutrophils Relative %: 64.7 % (ref 43.0–77.0)
Platelets: 198 10*3/uL (ref 150.0–400.0)
RBC: 4.67 Mil/uL (ref 3.87–5.11)
RDW: 15.1 % (ref 11.5–15.5)
WBC: 6.6 10*3/uL (ref 4.0–10.5)

## 2022-06-06 LAB — LDL CHOLESTEROL, DIRECT: Direct LDL: 74 mg/dL

## 2022-06-06 LAB — COMPREHENSIVE METABOLIC PANEL
ALT: 31 U/L (ref 0–35)
AST: 24 U/L (ref 0–37)
Albumin: 4.6 g/dL (ref 3.5–5.2)
Alkaline Phosphatase: 78 U/L (ref 39–117)
BUN: 18 mg/dL (ref 6–23)
CO2: 30 mEq/L (ref 19–32)
Calcium: 10 mg/dL (ref 8.4–10.5)
Chloride: 100 mEq/L (ref 96–112)
Creatinine, Ser: 0.53 mg/dL (ref 0.40–1.20)
GFR: 93.77 mL/min (ref >60.00)
Glucose, Bld: 116 mg/dL — ABNORMAL HIGH (ref 70–99)
Potassium: 4.6 mEq/L (ref 3.5–5.1)
Sodium: 139 mEq/L (ref 135–145)
Total Bilirubin: 1.2 mg/dL (ref 0.2–1.2)
Total Protein: 7.4 g/dL (ref 6.0–8.3)

## 2022-06-06 LAB — LIPID PANEL
Cholesterol: 163 mg/dL (ref 0–200)
HDL: 57.6 mg/dL (ref >39.00)
NonHDL: 105.18
Total CHOL/HDL Ratio: 3
Triglycerides: 236 mg/dL — ABNORMAL HIGH (ref 0.0–149.0)
VLDL: 47.2 mg/dL — ABNORMAL HIGH (ref 0.0–40.0)

## 2022-06-06 LAB — HEMOGLOBIN A1C: Hgb A1c MFr Bld: 6.3 % (ref 4.6–6.5)

## 2022-06-06 LAB — MICROALBUMIN / CREATININE URINE RATIO
Creatinine,U: 167.3 mg/dL
Microalb Creat Ratio: 1.5 mg/g (ref 0.0–30.0)
Microalb, Ur: 2.5 mg/dL — ABNORMAL HIGH (ref 0.0–1.9)

## 2022-06-06 LAB — VITAMIN D 25 HYDROXY (VIT D DEFICIENCY, FRACTURES): VITD: 56.01 ng/mL (ref 30.00–100.00)

## 2022-06-06 NOTE — Assessment & Plan Note (Signed)
Chronic Controlled, Stable Continue Sertraline 100 mg daily

## 2022-06-06 NOTE — Assessment & Plan Note (Signed)
Chronic  Lab Results  Component Value Date   HGBA1C 6.1 12/06/2021   Sugars well controlled Check A1c, urine microalbumin today Continue lifestyle control Stressed regular exercise, diabetic diet

## 2022-06-06 NOTE — Assessment & Plan Note (Signed)
Acute Having vertigo secondary to possible BPPV-right ear Has had this in the past to the left ear He is improving on its own Discussed physical therapy if needed

## 2022-06-06 NOTE — Assessment & Plan Note (Signed)
Chronic Blood pressure well controlled CMP Continue amlodipine 5 mg daily, benazepril 20 mg daily 

## 2022-06-06 NOTE — Assessment & Plan Note (Signed)
Chronic DEXA up-to-date Fosamax started 05/2021-plan for 5 years Encouraged regular exercise Continue calcium and vitamin D daily Check vitamin D level Advised adding vitamin K

## 2022-06-06 NOTE — Assessment & Plan Note (Signed)
Chronic Taking vitamin D daily Check vitamin D level  

## 2022-06-06 NOTE — Assessment & Plan Note (Signed)
Acute Nonspecific erythematous papules on arms and bottom of left foot-they do not itch or hurt Unsure of the cause was-she will monitor for now and if it gets worse consider dermatology evaluation

## 2022-06-06 NOTE — Assessment & Plan Note (Signed)
Chronic Regular exercise and healthy diet encouraged Check lipid panel  Continue Crestor 20 mg daily 

## 2022-06-25 ENCOUNTER — Telehealth: Payer: Medicare Other

## 2022-07-30 ENCOUNTER — Other Ambulatory Visit: Payer: Self-pay | Admitting: Internal Medicine

## 2022-08-29 ENCOUNTER — Other Ambulatory Visit: Payer: Self-pay | Admitting: Internal Medicine

## 2022-10-13 LAB — HM MAMMOGRAPHY

## 2022-11-08 NOTE — Progress Notes (Signed)
[facility] Note  11/09/2022     CHIEF COMPLAINT Patient presents for Retina Follow Up    HISTORY OF PRESENT ILLNESS: Jane Montgomery is a 72 y.o. female who presents to the clinic today for:   HPI     Retina Follow Up   Patient presents with  Dry AMD.  In both eyes.  This started years ago.  Duration of 6 months.  I, the attending physician,  performed the HPI with the patient and updated documentation appropriately.        Comments   Patient denies noticing any changes with her vision. She is using Latanoprost OU QHS. There has been no changes in her general health.       Last edited by Bernarda Caffey, MD on 11/09/2022  4:05 PM.    Pt states no change in vision, no new health concerns   Referring physician: Binnie Rail, MD [place],  [place] [address]  HISTORICAL INFORMATION:   Selected notes from the MEDICAL RECORD NUMBER Referred by Dr. Kathlen Mody for eval of GA paracentral mac   CURRENT MEDICATIONS: Current Outpatient Medications (Ophthalmic Drugs)  Medication Sig   latanoprost (XALATAN) 0.005 % ophthalmic solution SMARTSIG:1 Drop(s) In Eye(s) Every Evening   No current facility-administered medications for this visit. (Ophthalmic Drugs)   Current Outpatient Medications (Other)  Medication Sig   acetaminophen (TYLENOL) 325 MG tablet Take 650 mg by mouth every 6 (six) hours as needed.   alendronate (FOSAMAX) 70 MG tablet TAKE 1 TABLET(70 MG) BY MOUTH EVERY 7 DAYS WITH A FULL GLASS OF WATER AND ON AN EMPTY STOMACH   amLODipine (NORVASC) 5 MG tablet TAKE 1 TABLET(5 MG) BY MOUTH EVERY MORNING   amoxicillin (AMOXIL) 500 MG tablet TK 4 TS PO 1 HOUR PRIOR TO APPOINTMENT   aspirin 81 MG tablet Take 81 mg by mouth daily.   Azelastine-Fluticasone 137-50 MCG/ACT SUSP 1 spray in each nostril   benazepril (LOTENSIN) 20 MG tablet TAKE 1 TABLET(20 MG) BY MOUTH DAILY   Biotin 10000 MCG TABS Take 1 tablet by mouth daily.   calcium  carbonate (OS-CAL) 1250 (500 Ca) MG chewable tablet Chew 1 tablet by mouth daily.   cholecalciferol (VITAMIN D) 1000 units tablet Take 3,000 Units by mouth daily.   Coenzyme Q10 (CO Q-10) 100 MG CAPS Take 1 capsule by mouth daily.   Collagen-Boron-Hyaluronic Acid (MOVE FREE ULTRA JOINT HEALTH PO) Take 1 tablet by mouth daily.   desloratadine (CLARINEX) 5 MG tablet Take 5 mg by mouth daily.   montelukast (SINGULAIR) 10 MG tablet Take 10 mg by mouth at bedtime.   Multiple Vitamins-Minerals (CENTRUM SILVER 50+WOMEN) TABS Take 1 tablet by mouth daily.   Multiple Vitamins-Minerals (PRESERVISION AREDS 2 PO) Take 1 capsule by mouth in the morning and at bedtime.   Probiotic Product (PROBIOTIC DAILY PO) Take 1 capsule by mouth daily.   rosuvastatin (CRESTOR) 20 MG tablet Take 1 tablet (20 mg total) by mouth daily.   sertraline (ZOLOFT) 100 MG tablet TAKE 1 TABLET(100 MG) BY MOUTH DAILY   triamcinolone ointment (KENALOG) 0.1 % apply sparingly to affected areas   No current facility-administered medications for this visit. (Other)   REVIEW OF SYSTEMS: ROS   Positive for: Gastrointestinal, Neurological, Musculoskeletal, Endocrine, Cardiovascular, Eyes, Respiratory Negative for: Constitutional, Skin, Genitourinary, HENT, Psychiatric, Allergic/Imm, Heme/Lymph Last edited by Annie Paras, COT on 11/09/2022  9:43 AM.     ALLERGIES No Known Allergies  PAST MEDICAL HISTORY Past Medical History:  Diagnosis Date   Allergy    Anisocoria    evaluated by Dr Erling Cruz 2005   Arthritis    Benign positional vertigo    in LLDP   Chronic back pain    reason unknown    Depression    takes Zoloft daily- hormone imbalance - not truly depression per pt    Diarrhea    Diverticulosis    Gestational diabetes 1982, 1984   "pre"   Rosanna Randy syndrome    Heart murmur    Hyperlipidemia    takes Pravastatin daily   Hypertension    takes Amlodipine daily as well as Benazepril   Hypertensive retinopathy     Joint pain    Macular degeneration    early stages and dry   Nocturia    Osteoporosis    Shortness of breath dyspnea    allergy related and occasionally will take Claritin.   Sleep apnea    has cpap   Tremor, essential    Dr Erling Cruz   Past Surgical History:  Procedure Laterality Date   CATARACT EXTRACTION Bilateral 12/31/2013   CESAREAN SECTION     x 2   COLONOSCOPY  [phone]   Negative, redundant colon; [facility]   DILATION AND CURETTAGE OF UTERUS     Dr Irven [place]   EYE SURGERY     PLANTAR FASCIA SURGERY Bilateral    TONSILLECTOMY     TOTAL SHOULDER ARTHROPLASTY Right 10/13/2015   Procedure: TOTAL SHOULDER ARTHROPLASTY;  Surgeon: [name], MD;  Location: [facility];  Service: Orthopedics;  Laterality: Right;  Right total shoulder arthroplasty    FAMILY HISTORY Family History  Problem Relation Age of Onset   Hypertension Father    Heart attack Father 16       CABG    Colon cancer Mother 25   Macular degeneration Mother    Hypertension Mother    Seizures Mother        ? etiology (preceded CVA)   Stroke Mother 66   Osteoporosis Mother    Myelodysplastic syndrome Mother    Colon cancer Maternal Grandmother    Colon cancer Maternal Aunt    Colon cancer Other        2 Maternal Great Aunts   Heart attack Paternal Grandfather        > 63   Heart attack Paternal Aunt 29   Leukemia Paternal Grandmother    Diabetes Neg Hx    Colon polyps Neg Hx    Esophageal cancer Neg Hx    Rectal cancer Neg Hx    Stomach cancer Neg Hx    SOCIAL HISTORY Social History   Tobacco Use   Smoking status: Never   Smokeless tobacco: Never  Vaping Use   Vaping Use: Never used  Substance Use Topics   Alcohol use: No   Drug use: No       OPHTHALMIC EXAM: Base Eye Exam     Visual Acuity (Snellen - Linear)       Right Left   Dist cc 20/30 +2 20/30 -2   Dist ph cc NI NI    Correction: Glasses         Tonometry (Tonopen, 9:46 AM)       Right Left   Pressure 15 13          Pupils       Dark Light Shape React APD   Right 3 2 Round Slow None  Left 3 2 Round Slow None         Visual Fields       Left Right    Full Full         Extraocular Movement       Right Left    Full, Ortho Full, Ortho         Neuro/Psych     Oriented x3: Yes   Mood/Affect: Normal         Dilation     Both eyes: 1.0% Mydriacyl, 2.5% Phenylephrine @ 9:43 AM           Slit Lamp and Fundus Exam     Slit Lamp Exam       Right Left   Lids/Lashes Dermatochalasis - upper lid, Meibomian gland dysfunction Dermatochalasis - upper lid   Conjunctiva/Sclera White and quiet White and quiet   Cornea arcus, well healed cataract wounds arcus, well healed cataract wounds, trace PEE   Anterior Chamber Deep and quiet Deep and quiet   Iris Round and dilated Round and dilated, +PPM   Lens PC IOL in good position with open PC PC IOL in good position   Anterior Vitreous Vitreous syneresis, Posterior vitreous detachment, mild vitreous condensations Vitreous syneresis, Posterior vitreous detachment, vitreous condensations         Fundus Exam       Right Left   Disc mild Pallor, Sharp rim, mild PPA mild Pallor, Sharp rim, mild PPA   C/D Ratio 0.5 0.6   Macula Flat, Blunted foveal reflex, RPE mottling and clumping, Drusen, focal round area of atrophy IN to fovea -- slightly increased, No heme or edema Flat, Blunted foveal reflex, RPE mottling and clumping, diffuse early atrophy, No heme or edema   Vessels attenuated, mild tortuosity attenuated, mild tortuosity   Periphery Attached, scattered pavingstone degeneration, mild reticular degeneration, No heme Attached, scattered pavingstone degeneration, mild reticular degeneration, No heme           Refraction     Wearing Rx       Sphere Cylinder Axis Add   Right -2.50 +0.75 003 +2.50   Left -0.50 +0.50 163 +2.50           IMAGING AND PROCEDURES  Imaging and Procedures for 11/09/2022  OCT, Retina -  OU - Both Eyes       Right Eye Quality was good. Central Foveal Thickness: 298. Progression has worsened. Findings include normal foveal contour, no IRF, no SRF, retinal drusen , outer retinal atrophy (Diffuse ORA-- slightly worse, focal patch of severe thinning IN mac -- mild interval increase).   Left Eye Quality was good. Central Foveal Thickness: 274. Progression has been stable. Findings include normal foveal contour, no IRF, no SRF, retinal drusen , outer retinal atrophy (Diffuse ORA, stable improvement in central cystic changes).   Notes *Images captured and stored on drive  Diagnosis / Impression:  Non-exudative ARMD OU OD: Diffuse ORA-- slightly worse, focal patch of severe thinning IN mac -- mild interval increase OS: stable improvement in central cystic changes, diffuse ORA  Clinical management:  See below  Abbreviations: NFP - Normal foveal profile. CME - cystoid macular edema. PED - pigment epithelial detachment. IRF - intraretinal fluid. SRF - subretinal fluid. EZ - ellipsoid zone. ERM - epiretinal membrane. ORA - outer retinal atrophy. ORT - outer retinal tubulation. SRHM - subretinal hyper-reflective material. IRHM - intraretinal hyper-reflective material            ASSESSMENT/PLAN:  ICD-10-CM   1. Intermediate stage nonexudative age-related macular degeneration of both eyes  H35.3132 OCT, Retina - OU - Both Eyes    2. 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 Essential hypertension  I10     4. Hypertensive retinopathy of both eyes  H35.033     5. Pseudophakia, both eyes  Z96.1      1,2. 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 Hypertensive retinopathy OU - discussed importance of tight BP control - continue to monitor  5. Pseudophakia OU  - s/p CE/IOL OU  - IOsL in good position, doing well  - continue to monitor   Ophthalmic Meds Ordered this visit:  No orders of the defined types were placed in this encounter.     Return in about 6 months (around 05/10/2023) for f/u Non Ex. AMD OU, DFE, OCT.  There are no Patient Instructions on file for this visit.   Explained the diagnoses, plan, and follow up with the patient and they expressed understanding.  Patient expressed understanding of the importance of proper follow up care.   This document serves as a record of services personally performed by Gardiner Sleeper, MD, PhD. It was created on their behalf by Orvan Falconer, an ophthalmic technician. The creation of this record is the provider's dictation and/or activities during the visit.    Electronically signed by: Orvan Falconer, OA, 11/09/22  4:05 PM  This document serves as a record of services personally performed by Gardiner Sleeper, MD, PhD. It was created on their behalf by San Jetty. Owens Shark, OA an ophthalmic technician. The creation of this record is the provider's dictation and/or activities during the visit.    Electronically signed by: San Jetty. [place], [place] 11.10.2023 4:05 PM   Gardiner Sleeper, M.D., Ph.D. Diseases & Surgery of the Retina and Vitreous [facility]  I have reviewed the above documentation for accuracy and completeness, and I agree with the above. Gardiner Sleeper, M.D., Ph.D. 11/09/22 4:06 PM   Abbreviations: M myopia (nearsighted); A astigmatism; H hyperopia (farsighted); P presbyopia; Mrx spectacle prescription;  CTL contact lenses; OD right eye; OS left eye; OU both eyes  XT exotropia; ET esotropia; PEK punctate epithelial keratitis; PEE punctate epithelial erosions; DES dry eye syndrome; MGD meibomian gland dysfunction; ATs artificial tears; PFAT's preservative free artificial tears; [place] [employer] nuclear sclerotic cataract; PSC posterior subcapsular cataract; ERM epi-retinal membrane; PVD posterior vitreous detachment; RD retinal detachment; DM diabetes mellitus; DR diabetic retinopathy; NPDR non-proliferative diabetic retinopathy; PDR proliferative diabetic retinopathy; CSME clinically significant macular edema; DME diabetic  macular edema; dbh dot blot hemorrhages; CWS cotton wool spot; POAG primary open angle glaucoma; C/D cup-to-disc ratio; HVF humphrey visual field; GVF goldmann visual field; OCT optical coherence tomography; IOP intraocular pressure; BRVO Branch retinal vein occlusion; CRVO central retinal vein occlusion; CRAO central retinal artery occlusion; BRAO branch retinal artery occlusion; RT retinal tear; SB scleral buckle; PPV pars plana vitrectomy; VH Vitreous hemorrhage; PRP panretinal laser photocoagulation; IVK intravitreal kenalog; VMT vitreomacular traction; MH Macular hole;  NVD neovascularization of the disc; NVE neovascularization elsewhere; AREDS age related eye disease study; ARMD age related macular degeneration; POAG primary open angle glaucoma; EBMD epithelial/anterior basement membrane dystrophy; ACIOL anterior chamber intraocular lens; IOL intraocular lens; PCIOL posterior chamber intraocular lens; Phaco/IOL phacoemulsification with intraocular lens placement; [place] photorefractive keratectomy; LASIK laser assisted in  situ keratomileusis; HTN hypertension; DM diabetes mellitus; COPD chronic obstructive pulmonary disease

## 2022-11-09 ENCOUNTER — Encounter (INDEPENDENT_AMBULATORY_CARE_PROVIDER_SITE_OTHER): Payer: Self-pay | Admitting: Ophthalmology

## 2022-11-09 ENCOUNTER — Ambulatory Visit (INDEPENDENT_AMBULATORY_CARE_PROVIDER_SITE_OTHER): Payer: Medicare Other | Admitting: Ophthalmology

## 2022-11-09 DIAGNOSIS — H35383 Toxic maculopathy, bilateral: Secondary | ICD-10-CM | POA: Diagnosis not present

## 2022-11-09 DIAGNOSIS — H35033 Hypertensive retinopathy, bilateral: Secondary | ICD-10-CM | POA: Diagnosis not present

## 2022-11-09 DIAGNOSIS — H353132 Nonexudative age-related macular degeneration, bilateral, intermediate dry stage: Secondary | ICD-10-CM | POA: Diagnosis not present

## 2022-11-09 DIAGNOSIS — I10 Essential (primary) hypertension: Secondary | ICD-10-CM

## 2022-11-09 DIAGNOSIS — Z961 Presence of intraocular lens: Secondary | ICD-10-CM

## 2022-11-12 ENCOUNTER — Other Ambulatory Visit: Payer: Self-pay | Admitting: Internal Medicine

## 2022-11-21 LAB — HM DIABETES EYE EXAM

## 2022-12-04 NOTE — Patient Instructions (Addendum)
     Flu immunization administered today.     Blood work was ordered.   The lab is on the first floor.    Medications changes include :   none     Return in about 6 months (around 06/06/2023) for Physical Exam.

## 2022-12-04 NOTE — Progress Notes (Unsigned)
Subjective:    Patient ID: [name], female    DOB: May 30, 1950, 72 y.o.   MRN: [id number]     HPI Jane Montgomery is here for follow up of her chronic medical problems, including htn, DM, hld, depression, OP, vit d def  She is taking all of her medications as prescribed.    She does some chair exercises.  Has chronic knee pain and ankle pain.   She takes tylenol ES.  She uses biofreeze.    Medications and allergies reviewed with patient and updated if appropriate.  Current Outpatient Medications on File Prior to Visit  Medication Sig Dispense Refill   acetaminophen (TYLENOL) 325 MG tablet Take 650 mg by mouth every 6 (six) hours as needed.     alendronate (FOSAMAX) 70 MG tablet TAKE 1 TABLET(70 MG) BY MOUTH EVERY 7 DAYS WITH A FULL GLASS OF WATER AND ON AN EMPTY STOMACH 4 tablet 11   amLODipine (NORVASC) 5 MG tablet TAKE 1 TABLET(5 MG) BY MOUTH EVERY MORNING 90 tablet 1   amoxicillin (AMOXIL) 500 MG tablet TK 4 TS PO 1 HOUR PRIOR TO APPOINTMENT  0   aspirin 81 MG tablet Take 81 mg by mouth daily.     Azelastine-Fluticasone 137-50 MCG/ACT SUSP 1 spray in each nostril     benazepril (LOTENSIN) 20 MG tablet TAKE 1 TABLET(20 MG) BY MOUTH DAILY 90 tablet 1   Biotin 10000 MCG TABS Take 1 tablet by mouth daily.     calcium carbonate (OS-CAL) 1250 (500 Ca) MG chewable tablet Chew 1 tablet by mouth daily.     cholecalciferol (VITAMIN D) 1000 units tablet Take 3,000 Units by mouth daily.     Coenzyme Q10 (CO Q-10) 100 MG CAPS Take 1 capsule by mouth daily.     Collagen-Boron-Hyaluronic Acid (MOVE FREE ULTRA JOINT HEALTH PO) Take 1 tablet by mouth daily.     desloratadine (CLARINEX) 5 MG tablet Take 5 mg by mouth daily.     latanoprost (XALATAN) 0.005 % ophthalmic solution SMARTSIG:1 Drop(s) In Eye(s) Every Evening     montelukast (SINGULAIR) 10 MG tablet Take 10 mg by mouth at bedtime.     Multiple Vitamins-Minerals (CENTRUM SILVER 50+WOMEN) TABS Take 1 tablet by mouth daily.      Multiple Vitamins-Minerals (PRESERVISION AREDS 2 PO) Take 1 capsule by mouth in the morning and at bedtime.     Probiotic Product (PROBIOTIC DAILY PO) Take 1 capsule by mouth daily.     rosuvastatin (CRESTOR) 20 MG tablet TAKE 1 TABLET(20 MG) BY MOUTH DAILY 90 tablet 3   sertraline (ZOLOFT) 100 MG tablet TAKE 1 TABLET(100 MG) BY MOUTH DAILY 90 tablet 1   triamcinolone ointment (KENALOG) 0.1 % apply sparingly to affected areas     No current facility-administered medications on file prior to visit.     Review of Systems  Constitutional:  Negative for appetite change, chills and fever.  Respiratory:  Negative for cough, shortness of breath and wheezing.   Cardiovascular:  Negative for chest pain, palpitations and leg swelling.  Neurological:  Negative for light-headedness and headaches.  Psychiatric/Behavioral:  Negative for sleep disturbance.        Objective:   Vitals:   12/05/22 0955  BP: 132/70  Pulse: 70  Temp: 98 F (36.7 C)  SpO2: 97%   BP Readings from Last 3 Encounters:  12/05/22 132/70  06/06/22 130/70  03/01/22 118/70   Wt Readings from Last 3 Encounters:  12/05/22 255 lb (  115.7 kg)  06/06/22 253 lb (114.8 kg)  03/01/22 253 lb 12.8 oz (115.1 kg)   Body mass index is 46.64 kg/m.    Physical Exam Constitutional:      General: She is not in acute distress.    Appearance: Normal appearance.  HENT:     Head: Normocephalic and atraumatic.  Eyes:     Conjunctiva/sclera: Conjunctivae normal.  Cardiovascular:     Rate and Rhythm: Normal rate and regular rhythm.     Heart sounds: Murmur (2/6 sys) heard.  Pulmonary:     Effort: Pulmonary effort is normal. No respiratory distress.     Breath sounds: Normal breath sounds. No wheezing.  Musculoskeletal:     Cervical back: Neck supple.     Right lower leg: No edema.     Left lower leg: No edema.  Lymphadenopathy:     Cervical: No cervical adenopathy.  Skin:    General: Skin is warm and dry.     Findings: No  rash.  Neurological:     Mental Status: She is alert. Mental status is at baseline.  Psychiatric:        Mood and Affect: Mood normal.        Behavior: Behavior normal.        Lab Results  Component Value Date   WBC 6.6 06/06/2022   HGB 13.2 06/06/2022   HCT 40.5 06/06/2022   PLT 198.0 06/06/2022   GLUCOSE 116 (H) 06/06/2022   CHOL 163 06/06/2022   TRIG 236.0 (H) 06/06/2022   HDL 57.60 06/06/2022   LDLDIRECT 74.0 06/06/2022   LDLCALC 106 (H) 06/03/2020   ALT 31 06/06/2022   AST 24 06/06/2022   NA 139 06/06/2022   K 4.6 06/06/2022   CL 100 06/06/2022   CREATININE 0.53 06/06/2022   BUN 18 06/06/2022   CO2 30 06/06/2022   TSH 1.23 12/06/2020   INR 0.98 10/04/2015   HGBA1C 6.3 06/06/2022   MICROALBUR 2.5 (H) 06/06/2022     Assessment & Plan:    Flu immunization administered today.    See Problem List for Assessment and Plan of chronic medical problems.

## 2022-12-05 ENCOUNTER — Encounter: Payer: Self-pay | Admitting: Internal Medicine

## 2022-12-05 ENCOUNTER — Ambulatory Visit (INDEPENDENT_AMBULATORY_CARE_PROVIDER_SITE_OTHER): Payer: Medicare Other | Admitting: Internal Medicine

## 2022-12-05 VITALS — BP 132/70 | HR 70 | Temp 98.0°F | Ht 62.0 in | Wt 255.0 lb

## 2022-12-05 DIAGNOSIS — E559 Vitamin D deficiency, unspecified: Secondary | ICD-10-CM | POA: Diagnosis not present

## 2022-12-05 DIAGNOSIS — Z23 Encounter for immunization: Secondary | ICD-10-CM

## 2022-12-05 DIAGNOSIS — E119 Type 2 diabetes mellitus without complications: Secondary | ICD-10-CM

## 2022-12-05 DIAGNOSIS — E7849 Other hyperlipidemia: Secondary | ICD-10-CM

## 2022-12-05 DIAGNOSIS — F3289 Other specified depressive episodes: Secondary | ICD-10-CM

## 2022-12-05 DIAGNOSIS — M810 Age-related osteoporosis without current pathological fracture: Secondary | ICD-10-CM

## 2022-12-05 DIAGNOSIS — I10 Essential (primary) hypertension: Secondary | ICD-10-CM | POA: Diagnosis not present

## 2022-12-05 LAB — COMPREHENSIVE METABOLIC PANEL
ALT: 24 U/L (ref 0–35)
AST: 22 U/L (ref 0–37)
Albumin: 4.8 g/dL (ref 3.5–5.2)
Alkaline Phosphatase: 82 U/L (ref 39–117)
BUN: 21 mg/dL (ref 6–23)
CO2: 32 mEq/L (ref 19–32)
Calcium: 10.2 mg/dL (ref 8.4–10.5)
Chloride: 99 mEq/L (ref 96–112)
Creatinine, Ser: 0.54 mg/dL (ref 0.40–1.20)
GFR: 93.02 mL/min (ref >60.00)
Glucose, Bld: 114 mg/dL — ABNORMAL HIGH (ref 70–99)
Potassium: 4.5 mEq/L (ref 3.5–5.1)
Sodium: 138 mEq/L (ref 135–145)
Total Bilirubin: 1.1 mg/dL (ref 0.2–1.2)
Total Protein: 7.8 g/dL (ref 6.0–8.3)

## 2022-12-05 LAB — LIPID PANEL
Cholesterol: 169 mg/dL (ref 0–200)
HDL: 62.6 mg/dL (ref >39.00)
NonHDL: 105.99
Total CHOL/HDL Ratio: 3
Triglycerides: 258 mg/dL — ABNORMAL HIGH (ref 0.0–149.0)
VLDL: 51.6 mg/dL — ABNORMAL HIGH (ref 0.0–40.0)

## 2022-12-05 LAB — HEMOGLOBIN A1C: Hgb A1c MFr Bld: 6.5 % (ref 4.6–6.5)

## 2022-12-05 LAB — LDL CHOLESTEROL, DIRECT: Direct LDL: 74 mg/dL

## 2022-12-05 LAB — VITAMIN D 25 HYDROXY (VIT D DEFICIENCY, FRACTURES): VITD: 45.69 ng/mL (ref 30.00–100.00)

## 2022-12-05 NOTE — Assessment & Plan Note (Signed)
Chronic   Lab Results  Component Value Date   HGBA1C 6.3 06/06/2022   Sugars well controlled Check A1c Continue lifestyle control Stressed regular exercise, diabetic diet

## 2022-12-05 NOTE — Assessment & Plan Note (Signed)
Chronic Taking vitamin d daily Check vitamin d level  

## 2022-12-05 NOTE — Assessment & Plan Note (Signed)
Chronic Blood pressure well controlled CMP Continue amlodipine 5 mg daily, benazepril 20 mg daily

## 2022-12-05 NOTE — Assessment & Plan Note (Signed)
Chronic Dexa up to date Continue taking calcium and vitamin d daily Encouraged regular exercise

## 2022-12-05 NOTE — Progress Notes (Signed)
Outside notes received. Information abstracted. Notes sent to scan.  

## 2022-12-05 NOTE — Assessment & Plan Note (Signed)
Chronic Check lipid panel  Continue crestor 20 mg daily Regular exercise and healthy diet encouraged  

## 2022-12-05 NOTE — Assessment & Plan Note (Signed)
Chronic Controlled, stable Continue  sertraline 100 mg daily 

## 2022-12-06 ENCOUNTER — Encounter: Payer: Self-pay | Admitting: Internal Medicine

## 2022-12-06 NOTE — Progress Notes (Signed)
Outside notes received. Information abstracted. Notes sent to scan.  

## 2023-02-10 ENCOUNTER — Other Ambulatory Visit: Payer: Self-pay | Admitting: Internal Medicine

## 2023-02-11 ENCOUNTER — Other Ambulatory Visit: Payer: Self-pay

## 2023-03-07 ENCOUNTER — Ambulatory Visit (INDEPENDENT_AMBULATORY_CARE_PROVIDER_SITE_OTHER): Payer: Medicare Other

## 2023-03-07 VITALS — BP 120/60 | HR 65 | Temp 97.9°F | Ht 62.0 in | Wt 258.8 lb

## 2023-03-07 DIAGNOSIS — Z0000 Encounter for general adult medical examination without abnormal findings: Secondary | ICD-10-CM | POA: Diagnosis not present

## 2023-03-07 NOTE — Patient Instructions (Addendum)
Jane Montgomery , Thank you for taking time to come for your Medicare Wellness Visit. I appreciate your ongoing commitment to your health goals. Please review the following plan we discussed and let me know if I can assist you in the future.   These are the goals we discussed:  Goals      My goal for 2024 is to maintain my health.        This is a list of the screening recommended for you and due dates:  Health Maintenance  Topic Date Due   Complete foot exam   10/07/2019   DTaP/Tdap/Td vaccine (2 - Td or Tdap) 08/13/2022   Medicare Annual Wellness Visit  03/02/2023   Hemoglobin A1C  06/06/2023   Yearly kidney health urinalysis for diabetes  06/07/2023   DEXA scan (bone density measurement)  06/08/2023   Colon Cancer Screening  07/15/2023   Eye exam for diabetics  11/22/2023   Yearly kidney function blood test for diabetes  12/06/2023   Mammogram  10/13/2024   Pneumonia Vaccine  Completed   Flu Shot  Completed   Hepatitis C Screening: USPSTF Recommendation to screen - Ages 18-79 yo.  Completed   Zoster (Shingles) Vaccine  Completed   HPV Vaccine  Aged Out   COVID-19 Vaccine  Discontinued    Advanced directives: Yes; documents on file.  Conditions/risks identified: Yes  Next appointment: Follow up in one year for your annual wellness visit.   Preventive Care 14 Years and Older, Female Preventive care refers to lifestyle choices and visits with your health care provider that can promote health and wellness. What does preventive care include? A yearly physical exam. This is also called an annual well check. Dental exams once or twice a year. Routine eye exams. Ask your health care provider how often you should have your eyes checked. Personal lifestyle choices, including: Daily care of your teeth and gums. Regular physical activity. Eating a healthy diet. Avoiding tobacco and drug use. Limiting alcohol use. Practicing safe sex. Taking low-dose aspirin every day. Taking  vitamin and mineral supplements as recommended by your health care provider. What happens during an annual well check? The services and screenings done by your health care provider during your annual well check will depend on your age, overall health, lifestyle risk factors, and family history of disease. Counseling  Your health care provider may ask you questions about your: Alcohol use. Tobacco use. Drug use. Emotional well-being. Home and relationship well-being. Sexual activity. Eating habits. History of falls. Memory and ability to understand (cognition). Work and work Statistician. Reproductive health. Screening  You may have the following tests or measurements: Height, weight, and BMI. Blood pressure. Lipid and cholesterol levels. These may be checked every 5 years, or more frequently if you are over 61 years old. Skin check. Lung cancer screening. You may have this screening every year starting at age 23 if you have a 30-pack-year history of smoking and currently smoke or have quit within the past 15 years. Fecal occult blood test (FOBT) of the stool. You may have this test every year starting at age 42. Flexible sigmoidoscopy or colonoscopy. You may have a sigmoidoscopy every 5 years or a colonoscopy every 10 years starting at age 32. Hepatitis C blood test. Hepatitis B blood test. Sexually transmitted disease (STD) testing. Diabetes screening. This is done by checking your blood sugar (glucose) after you have not eaten for a while (fasting). You may have this done every 1-3 years. Bone density  scan. This is done to screen for osteoporosis. You may have this done starting at age 3. Mammogram. This may be done every 1-2 years. Talk to your health care provider about how often you should have regular mammograms. Talk with your health care provider about your test results, treatment options, and if necessary, the need for more tests. Vaccines  Your health care provider may  recommend certain vaccines, such as: Influenza vaccine. This is recommended every year. Tetanus, diphtheria, and acellular pertussis (Tdap, Td) vaccine. You may need a Td booster every 10 years. Zoster vaccine. You may need this after age 75. Pneumococcal 13-valent conjugate (PCV13) vaccine. One dose is recommended after age 5. Pneumococcal polysaccharide (PPSV23) vaccine. One dose is recommended after age 69. Talk to your health care provider about which screenings and vaccines you need and how often you need them. This information is not intended to replace advice given to you by your health care provider. Make sure you discuss any questions you have with your health care provider. Document Released: 01/13/2016 Document Revised: 09/05/2016 Document Reviewed: 10/18/2015 Elsevier Interactive Patient Education  2017 [place] Prevention in the Home Falls can cause injuries. They can happen to people of all ages. There are many things you can do to make your home safe and to help prevent falls. What can I do on the outside of my home? Regularly fix the edges of walkways and driveways and fix any cracks. Remove anything that might make you trip as you walk through a door, such as a raised step or threshold. Trim any bushes or trees on the path to your home. Use bright outdoor lighting. Clear any walking paths of anything that might make someone trip, such as rocks or tools. Regularly check to see if handrails are loose or broken. Make sure that both sides of any steps have handrails. Any raised decks and porches should have guardrails on the edges. Have any leaves, snow, or ice cleared regularly. Use sand or salt on walking paths during winter. Clean up any spills in your garage right away. This includes oil or grease spills. What can I do in the bathroom? Use night lights. Install grab bars by the toilet and in the tub and shower. Do not use towel bars as grab bars. Use non-skid  mats or decals in the tub or shower. If you need to sit down in the shower, use a plastic, non-slip stool. Keep the floor dry. Clean up any water that spills on the floor as soon as it happens. Remove soap buildup in the tub or shower regularly. Attach bath mats securely with double-sided non-slip rug tape. Do not have throw rugs and other things on the floor that can make you trip. What can I do in the bedroom? Use night lights. Make sure that you have a light by your bed that is easy to reach. Do not use any sheets or blankets that are too big for your bed. They should not hang down onto the floor. Have a firm chair that has side arms. You can use this for support while you get dressed. Do not have throw rugs and other things on the floor that can make you trip. What can I do in the kitchen? Clean up any spills right away. Avoid walking on wet floors. Keep items that you use a lot in easy-to-reach places. If you need to reach something above you, use a strong step stool that has a grab bar. Keep electrical  cords out of the way. Do not use floor polish or wax that makes floors slippery. If you must use wax, use non-skid floor wax. Do not have throw rugs and other things on the floor that can make you trip. What can I do with my stairs? Do not leave any items on the stairs. Make sure that there are handrails on both sides of the stairs and use them. Fix handrails that are broken or loose. Make sure that handrails are as long as the stairways. Check any carpeting to make sure that it is firmly attached to the stairs. Fix any carpet that is loose or worn. Avoid having throw rugs at the top or bottom of the stairs. If you do have throw rugs, attach them to the floor with carpet tape. Make sure that you have a light switch at the top of the stairs and the bottom of the stairs. If you do not have them, ask someone to add them for you. What else can I do to help prevent falls? Wear shoes  that: Do not have high heels. Have rubber bottoms. Are comfortable and fit you well. Are closed at the toe. Do not wear sandals. If you use a stepladder: Make sure that it is fully opened. Do not climb a closed stepladder. Make sure that both sides of the stepladder are locked into place. Ask someone to hold it for you, if possible. Clearly mark and make sure that you can see: Any grab bars or handrails. First and last steps. Where the edge of each step is. Use tools that help you move around (mobility aids) if they are needed. These include: Canes. Walkers. Scooters. Crutches. Turn on the lights when you go into a dark area. Replace any light bulbs as soon as they burn out. Set up your furniture so you have a clear path. Avoid moving your furniture around. If any of your floors are uneven, fix them. If there are any pets around you, be aware of where they are. Review your medicines with your doctor. Some medicines can make you feel dizzy. This can increase your chance of falling. Ask your doctor what other things that you can do to help prevent falls. This information is not intended to replace advice given to you by your health care provider. Make sure you discuss any questions you have with your health care provider. Document Released: 10/13/2009 Document Revised: 05/24/2016 Document Reviewed: 01/21/2015 Elsevier Interactive Patient Education  2017 [employer].

## 2023-03-07 NOTE — Progress Notes (Signed)
Subjective:   Jane Montgomery is a 73 y.o. female who presents for Medicare Annual (Subsequent) preventive examination.  Review of Systems     Cardiac Risk Factors include: advanced age (>37mn, >>21women);diabetes mellitus;dyslipidemia;family history of premature cardiovascular disease;hypertension;obesity (BMI >30kg/m2)     Objective:    Today's Vitals   03/07/23 1118 03/07/23 1119  BP: 120/60   Pulse: 65   Temp: 97.9 F (36.6 C)   TempSrc: Temporal   SpO2: 96%   Weight: 258 lb 12.8 oz (117.4 kg)   Height: '5\' 2"'$  (1.575 m)   PainSc: 3  3   PainLoc: Knee    Body mass index is 47.34 kg/m.     03/07/2023   11:49 AM 03/01/2022   12:17 PM 02/28/2021   11:21 AM 06/04/2019    9:04 AM 07/14/2018    7:41 AM 04/10/2018    6:08 PM 10/14/2015   10:38 AM  Advanced Directives  Does Patient Have a Medical Advance Directive? Yes Yes Yes No No No No  Type of AParamedicof AOronoLiving will Living will;Healthcare Power of A[place]Living will      Does patient want to make changes to medical advance directive? No - Patient declined  No - Patient declined   Yes (ED - Information included in AVS)   Copy of [place]? Yes - validated most recent copy scanned in chart (See row information) Yes - validated most recent copy scanned in chart (See row information) No - copy requested      Would patient like information on creating a medical advance directive?    No - Patient declined No - Patient declined  Yes - Educational materials given    Current Medications (verified) Outpatient Encounter Medications as of 03/07/2023  Medication Sig   amLODipine (NORVASC) 5 MG tablet TAKE 1 TABLET(5 MG) BY MOUTH EVERY MORNING   benazepril (LOTENSIN) 20 MG tablet TAKE 1 TABLET(20 MG) BY MOUTH DAILY   sertraline (ZOLOFT) 100 MG tablet TAKE 1 TABLET(100 MG) BY MOUTH DAILY   acetaminophen (TYLENOL) 325 MG tablet Take 650 mg by mouth  every 6 (six) hours as needed.   alendronate (FOSAMAX) 70 MG tablet TAKE 1 TABLET(70 MG) BY MOUTH EVERY 7 DAYS WITH A FULL GLASS OF WATER AND ON AN EMPTY STOMACH   amoxicillin (AMOXIL) 500 MG tablet TK 4 TS PO 1 HOUR PRIOR TO APPOINTMENT   aspirin 81 MG tablet Take 81 mg by mouth daily.   Azelastine-Fluticasone 137-50 MCG/ACT SUSP 1 spray in each nostril   Biotin 10000 MCG TABS Take 1 tablet by mouth daily.   calcium carbonate (OS-CAL) 1250 (500 Ca) MG chewable tablet Chew 1 tablet by mouth daily.   cholecalciferol (VITAMIN D) 1000 units tablet Take 3,000 Units by mouth daily.   Coenzyme Q10 (CO Q-10) 100 MG CAPS Take 1 capsule by mouth daily.   Collagen-Boron-Hyaluronic Acid (MOVE FREE ULTRA JOINT HEALTH PO) Take 1 tablet by mouth daily.   desloratadine (CLARINEX) 5 MG tablet Take 5 mg by mouth daily.   latanoprost (XALATAN) 0.005 % ophthalmic solution SMARTSIG:1 Drop(s) In Eye(s) Every Evening   montelukast (SINGULAIR) 10 MG tablet Take 10 mg by mouth at bedtime.   Multiple Vitamins-Minerals (CENTRUM SILVER 50+WOMEN) TABS Take 1 tablet by mouth daily.   Multiple Vitamins-Minerals (PRESERVISION AREDS 2 PO) Take 1 capsule by mouth in the morning and at bedtime.   Probiotic Product (PROBIOTIC DAILY PO) Take  1 capsule by mouth daily.   rosuvastatin (CRESTOR) 20 MG tablet TAKE 1 TABLET(20 MG) BY MOUTH DAILY   triamcinolone ointment (KENALOG) 0.1 % apply sparingly to affected areas   No facility-administered encounter medications on file as of 03/07/2023.    Allergies (verified) Patient has no known allergies.   History: Past Medical History:  Diagnosis Date   Allergy    Anisocoria    evaluated by Dr Erling Cruz 2005   Arthritis    Benign positional vertigo    in LLDP   Chronic back pain    reason unknown    Depression    takes Zoloft daily- hormone imbalance - not truly depression per pt    Diarrhea    Diverticulosis    Gestational diabetes 1982, 1984   "pre"   Rosanna Randy syndrome     Heart murmur    Hyperlipidemia    takes Pravastatin daily   Hypertension    takes Amlodipine daily as well as Benazepril   Hypertensive retinopathy    Joint pain    Macular degeneration    early stages and dry   Nocturia    Osteoporosis    Shortness of breath dyspnea    allergy related and occasionally will take Claritin.   Sleep apnea    has cpap   Tremor, essential    Dr Erling Cruz   Past Surgical History:  Procedure Laterality Date   CATARACT EXTRACTION Bilateral 12/31/2013   CESAREAN SECTION     x 2   COLONOSCOPY  [phone]   Negative, redundant colon; [facility]   DILATION AND CURETTAGE OF UTERUS     Dr Irven [place]   EYE SURGERY     PLANTAR FASCIA SURGERY Bilateral    TONSILLECTOMY     TOTAL SHOULDER ARTHROPLASTY Right 10/13/2015   Procedure: TOTAL SHOULDER ARTHROPLASTY;  Surgeon: [name], MD;  Location: [facility];  Service: Orthopedics;  Laterality: Right;  Right total shoulder arthroplasty   Family History  Problem Relation Age of Onset   Hypertension Father    Heart attack Father 24       CABG    Colon cancer Mother 40   Macular degeneration Mother    Hypertension Mother    Seizures Mother        ? etiology (preceded CVA)   Stroke Mother 11   Osteoporosis Mother    Myelodysplastic syndrome Mother    Colon cancer Maternal Grandmother    Colon cancer Maternal Aunt    Colon cancer Other        2 Maternal Great Aunts   Heart attack Paternal Grandfather        > 27   Heart attack Paternal Aunt 7   Leukemia Paternal Grandmother    Diabetes Neg Hx    Colon polyps Neg Hx    Esophageal cancer Neg Hx    Rectal cancer Neg Hx    Stomach cancer Neg Hx    Social History   Socioeconomic History   Marital status: Widowed    Spouse name: Not on file   Number of children: 2   Years of education: Not on file   Highest education level: Not on file  Occupational History   Not on file  Tobacco Use   Smoking status: Never   Smokeless tobacco: Never  Vaping  Use   Vaping Use: Never used  Substance and Sexual Activity   Alcohol use: No   Drug use: No   Sexual activity: Yes    Birth  control/protection: Post-menopausal  Other Topics Concern   Not on file  Social History Narrative   Not on file   Social Determinants of Health   Financial Resource Strain: Low Risk  (03/07/2023)   Overall Financial Resource Strain (CARDIA)    Difficulty of Paying Living Expenses: Not hard at all  Food Insecurity: No Food Insecurity (03/07/2023)   Hunger Vital Sign    Worried About Running Out of Food in the Last Year: Never true    Ran Out of Food in the Last Year: Never true  Transportation Needs: No Transportation Needs (03/07/2023)   PRAPARE - Hydrologist (Medical): No    Lack of Transportation (Non-Medical): No  Physical Activity: Sufficiently Active (03/07/2023)   Exercise Vital Sign    Days of Exercise per Week: 5 days    Minutes of Exercise per Session: 30 min  Stress: No Stress Concern Present (03/07/2023)   [place]    Feeling of Stress : Not at all  Social Connections: Moderately Integrated (03/07/2023)   Social Connection and Isolation Panel [NHANES]    Frequency of Communication with Friends and Family: More than three times a week    Frequency of Social Gatherings with Friends and Family: Once a week    Attends Religious Services: 1 to 4 times per year    Active Member of [employer] or Organizations: No    Attends Archivist Meetings: 1 to 4 times per year    Marital Status: Widowed    Tobacco Counseling Counseling given: Not Answered   Clinical Intake:  Pre-visit preparation completed: Yes  Pain : 0-10 Pain Score: 3  Pain Type: Chronic pain Pain Location: Knee Pain Orientation: Right     BMI - recorded: 47.34 Nutritional Status: BMI > 30  Obese Nutritional Risks: None Diabetes: No  How often do you need to have someone help you  when you read instructions, pamphlets, or other written materials from your doctor or pharmacy?: 1 - Never What is the last grade level you completed in school?: HSG  Diabetic? No  Interpreter Needed?: No  Information entered by :: Lisette Abu, LPN.   Activities of Daily Living    03/07/2023   11:50 AM  In your present state of health, do you have any difficulty performing the following activities:  Hearing? 0  Vision? 0  Difficulty concentrating or making decisions? 0  Walking or climbing stairs? 0  Dressing or bathing? 0  Doing errands, shopping? 0  Preparing Food and eating ? N  Using the Toilet? N  In the past six months, have you accidently leaked urine? N  Do you have problems with loss of bowel control? N  Managing your Medications? N  Managing your Finances? N  Housekeeping or managing your Housekeeping? N    Patient Care Team: Binnie Rail, MD as PCP - General (Internal Medicine) Monna Fam, MD as Consulting Physician (Ophthalmology) Szabat, Darnelle Maffucci, [facility] (Inactive) as Pharmacist (Pharmacist) Bernarda Caffey, MD as Consulting Physician (Ophthalmology)  Indicate any recent Medical Services you may have received from other than [facility] providers in the past year (date may be approximate).     Assessment:   This is a routine wellness examination for Klee.  Hearing/Vision screen Hearing Screening - Comments:: Denies hearing difficulties   Vision Screening - Comments:: Wears rx glasses - up to date with routine eye exams with Monna Fam, MD and Bernarda Caffey, MD.  Dietary issues and exercise activities discussed: Current Exercise Habits: Home exercise routine, Type of exercise: walking, Time (Minutes): 30, Frequency (Times/Week): 5, Weekly Exercise (Minutes/Week): 150, Intensity: Moderate, Exercise limited by: orthopedic condition(s)   Goals Addressed             This Visit's Progress    My goal for 2024 is to maintain my health.         Depression Screen    03/07/2023   11:47 AM 12/05/2022   10:03 AM 06/06/2022   10:00 AM 03/01/2022   12:14 PM 06/28/2021   11:09 AM 02/28/2021   11:48 AM 12/06/2020    9:44 AM  PHQ 2/9 Scores  PHQ - 2 Score 0 0 0 0 1 0 1  PHQ- 9 Score '1 1 2  3  2    '$ Fall Risk    03/07/2023   11:50 AM 12/05/2022   10:03 AM 06/06/2022   10:00 AM 03/01/2022   12:20 PM 02/28/2021   11:51 AM  Fall Risk   Falls in the past year? 0 0 0 1 0  Number falls in past yr: 0 0 0 0 0  Injury with Fall? 0 0 0 0 0  Risk for fall due to : No Fall Risks No Fall Risks No Fall Risks No Fall Risks No Fall Risks  Follow up Falls prevention discussed Falls evaluation completed Falls evaluation completed Falls evaluation completed     FALL RISK PREVENTION PERTAINING TO THE HOME:  Any stairs in or around the home? No  If so, are there any without handrails? No  Home free of loose throw rugs in walkways, pet beds, electrical cords, etc? Yes  Adequate lighting in your home to reduce risk of falls? Yes   ASSISTIVE DEVICES UTILIZED TO PREVENT FALLS:  Life alert? No  Use of a cane, walker or w/c? No  Grab bars in the bathroom? Yes  Shower chair or bench in shower? Yes  Elevated toilet seat or a handicapped toilet? No   TIMED UP AND GO:  Was the test performed? Yes .  Length of time to ambulate 10 feet: 8 sec.   Gait steady and fast without use of assistive device  Cognitive Function:        03/07/2023   11:51 AM 03/01/2022   12:21 PM  6CIT Screen  What Year? 0 points 0 points  What month? 0 points 0 points  What time? 0 points 0 points  Count back from 20 0 points 0 points  Months in reverse 0 points 0 points  Repeat phrase 0 points 0 points  Total Score 0 points 0 points    Immunizations Immunization History  Administered Date(s) Administered   Fluad Quad(high Dose 65+) 12/06/2020, 12/05/2022   Influenza Split 01/23/2012, 09/08/2014   Influenza Whole 10/02/2008   Influenza, High Dose Seasonal PF 09/26/2017,  10/06/2018, 11/09/2019   Influenza-Unspecified 10/14/2013, 10/01/2015, 09/20/2016, 09/26/2017, 10/26/2021   Pneumococcal Conjugate-13 09/17/2012   Pneumococcal Polysaccharide-23 04/03/2017   Tdap 08/13/2012   Zoster Recombinat (Shingrix) 08/29/2021, 10/24/2021   Zoster, Live 10/14/2013    TDAP status: Due, Education has been provided regarding the importance of this vaccine. Advised may receive this vaccine at local pharmacy or Health Dept. Aware to provide a copy of the vaccination record if obtained from local pharmacy or Health Dept. Verbalized acceptance and understanding.  Flu Vaccine status: Up to date  Pneumococcal vaccine status: Up to date  Covid-19 vaccine status: Declined, Education has been provided  regarding the importance of this vaccine but patient still declined. Advised may receive this vaccine at local pharmacy or Health Dept.or vaccine clinic. Aware to provide a copy of the vaccination record if obtained from local pharmacy or Health Dept. Verbalized acceptance and understanding.  Qualifies for Shingles Vaccine? Yes   Zostavax completed Yes   Shingrix Completed?: Yes  Screening Tests Health Maintenance  Topic Date Due   FOOT EXAM  10/07/2019   DTaP/Tdap/Td (2 - Td or Tdap) 08/13/2022   HEMOGLOBIN A1C  06/06/2023   Diabetic kidney evaluation - Urine ACR  06/07/2023   DEXA SCAN  06/08/2023   COLONOSCOPY (Pts 45-16yr Insurance coverage will need to be confirmed)  07/15/2023   OPHTHALMOLOGY EXAM  11/22/2023   Diabetic kidney evaluation - eGFR measurement  12/06/2023   Medicare Annual Wellness (AWV)  03/06/2024   MAMMOGRAM  10/13/2024   Pneumonia Vaccine 73 Years old  Completed   INFLUENZA VACCINE  Completed   Hepatitis C Screening  Completed   Zoster Vaccines- Shingrix  Completed   HPV VACCINES  Aged Out   COVID-19 Vaccine  Discontinued    Health Maintenance  Health Maintenance Due  Topic Date Due   FOOT EXAM  10/07/2019   DTaP/Tdap/Td (2 - Td or Tdap)  08/13/2022    Colorectal cancer screening: Type of screening: Colonoscopy. Completed 07/14/2018. Repeat every 5 years  Mammogram status: Completed 10/13/2022. Repeat every year  Bone Density status: Completed 06/07/2021. Results reflect: Bone density results: OSTEOPOROSIS. Repeat every 2 years.  Lung Cancer Screening: (Low Dose CT Chest recommended if Age 71-80years, 30 pack-year currently smoking OR have quit w/in 15years.) does not qualify.   Lung Cancer Screening Referral: no  Additional Screening:  Hepatitis C Screening: does qualify; Completed 03/06/2016  Vision Screening: Recommended annual ophthalmology exams for early detection of glaucoma and other disorders of the eye. Is the patient up to date with their annual eye exam?  Yes  Who is the provider or what is the name of the office in which the patient attends annual eye exams? KMonna Fam MD and BBernarda Caffey MD. If pt is not established with a provider, would they like to be referred to a provider to establish care? No .   Dental Screening: Recommended annual dental exams for proper oral hygiene  Community Resource Referral / Chronic Care Management: CRR required this visit?  No   CCM required this visit?  No      Plan:     I have personally reviewed and noted the following in the patient's chart:   Medical and social history Use of alcohol, tobacco or illicit drugs  Current medications and supplements including opioid prescriptions. Patient is not currently taking opioid prescriptions. Functional ability and status Nutritional status Physical activity Advanced directives List of other physicians Hospitalizations, surgeries, and ER visits in previous 12 months Vitals Screenings to include cognitive, depression, and falls Referrals and appointments  In addition, I have reviewed and discussed with patient certain preventive protocols, quality metrics, and best practice recommendations. A written personalized  care plan for preventive services as well as general preventive health recommendations were provided to patient.     SSheral Flow LPN   [id number]  Nurse Notes: Normal cognitive status assessed by direct observation by this Nurse Health Advisor. No abnormalities found.

## 2023-04-23 NOTE — Progress Notes (Signed)
[facility] - Clinic Note  05/07/2023     CHIEF COMPLAINT Patient presents for Retina Follow Up    HISTORY OF PRESENT ILLNESS: Jane Montgomery is a 73 y.o. female who presents to the clinic today for:   HPI     Retina Follow Up   Patient presents with  Dry AMD.  In both eyes.  This started 6 months ago.  Duration of 6 months.  Since onset it is stable.  I, the attending physician,  performed the HPI with the patient and updated documentation appropriately.        Comments   6 month retina follow up AMD pt is reporting no vision changes noticed she denies any flashes or floaters she did not use latanoprost last night       Last edited by Rennis Chris, MD on 05/07/2023  1:09 PM.     Pt states vision is stable   Referring physician: Pincus Sanes, MD [address] [place],  [place] [address]  HISTORICAL INFORMATION:   Selected notes from the MEDICAL RECORD NUMBER Referred by Dr. Alben Spittle for eval of GA paracentral mac   CURRENT MEDICATIONS: Current Outpatient Medications (Ophthalmic Drugs)  Medication Sig   latanoprost (XALATAN) 0.005 % ophthalmic solution SMARTSIG:1 Drop(s) In Eye(s) Every Evening   No current facility-administered medications for this visit. (Ophthalmic Drugs)   Current Outpatient Medications (Other)  Medication Sig   amLODipine (NORVASC) 5 MG tablet TAKE 1 TABLET(5 MG) BY MOUTH EVERY MORNING   benazepril (LOTENSIN) 20 MG tablet TAKE 1 TABLET(20 MG) BY MOUTH DAILY   sertraline (ZOLOFT) 100 MG tablet TAKE 1 TABLET(100 MG) BY MOUTH DAILY   acetaminophen (TYLENOL) 325 MG tablet Take 650 mg by mouth every 6 (six) hours as needed.   alendronate (FOSAMAX) 70 MG tablet TAKE 1 TABLET(70 MG) BY MOUTH EVERY 7 DAYS WITH A FULL GLASS OF WATER AND ON AN EMPTY STOMACH   amoxicillin (AMOXIL) 500 MG tablet TK 4 TS PO 1 HOUR PRIOR TO APPOINTMENT   aspirin 81 MG tablet Take 81 mg by mouth daily.   Azelastine-Fluticasone 137-50 MCG/ACT SUSP 1  spray in each nostril   Biotin 60454 MCG TABS Take 1 tablet by mouth daily.   calcium carbonate (OS-CAL) 1250 (500 Ca) MG chewable tablet Chew 1 tablet by mouth daily.   cholecalciferol (VITAMIN D) 1000 units tablet Take 3,000 Units by mouth daily.   Coenzyme Q10 (CO Q-10) 100 MG CAPS Take 1 capsule by mouth daily.   Collagen-Boron-Hyaluronic Acid (MOVE FREE ULTRA JOINT HEALTH PO) Take 1 tablet by mouth daily.   desloratadine (CLARINEX) 5 MG tablet Take 5 mg by mouth daily.   montelukast (SINGULAIR) 10 MG tablet Take 10 mg by mouth at bedtime.   Multiple Vitamins-Minerals (CENTRUM SILVER 50+WOMEN) TABS Take 1 tablet by mouth daily.   Multiple Vitamins-Minerals (PRESERVISION AREDS 2 PO) Take 1 capsule by mouth in the morning and at bedtime.   Probiotic Product (PROBIOTIC DAILY PO) Take 1 capsule by mouth daily.   rosuvastatin (CRESTOR) 20 MG tablet TAKE 1 TABLET(20 MG) BY MOUTH DAILY   triamcinolone ointment (KENALOG) 0.1 % apply sparingly to affected areas   No current facility-administered medications for this visit. (Other)   REVIEW OF SYSTEMS: ROS   Positive for: Gastrointestinal, Neurological, Musculoskeletal, Endocrine, Cardiovascular, Eyes, Respiratory Negative for: Constitutional, Skin, Genitourinary, HENT, Psychiatric, Allergic/Imm, Heme/Lymph Last edited by Etheleen Mayhew, COT on 05/07/2023  9:55 AM.  ALLERGIES No Known Allergies  PAST MEDICAL HISTORY Past Medical History:  Diagnosis Date   Allergy    Anisocoria    evaluated by Dr Sandria Manly 2005   Arthritis    Benign positional vertigo    in LLDP   Chronic back pain    reason unknown    Depression    takes Zoloft daily- hormone imbalance - not truly depression per pt    Diarrhea    Diverticulosis    Gestational diabetes 1982, 1984   "pre"   Gilbert syndrome    Heart murmur    Hyperlipidemia    takes Pravastatin daily   Hypertension    takes Amlodipine daily as well as Benazepril   Hypertensive  retinopathy    Joint pain    Macular degeneration    early stages and dry   Nocturia    Osteoporosis    Shortness of breath dyspnea    allergy related and occasionally will take Claritin.   Sleep apnea    has cpap   Tremor, essential    Dr Sandria Manly   Past Surgical History:  Procedure Laterality Date   CATARACT EXTRACTION Bilateral 12/31/2013   CESAREAN SECTION     x 2   COLONOSCOPY  [phone]   Negative, redundant colon; [facility]   DILATION AND CURETTAGE OF UTERUS     Dr Rosalio Macadamia   EYE SURGERY     PLANTAR FASCIA SURGERY Bilateral    TONSILLECTOMY     TOTAL SHOULDER ARTHROPLASTY Right 10/13/2015   Procedure: TOTAL SHOULDER ARTHROPLASTY;  Surgeon: Jones Broom, MD;  Location: [facility] OR;  Service: Orthopedics;  Laterality: Right;  Right total shoulder arthroplasty    FAMILY HISTORY Family History  Problem Relation Age of Onset   Hypertension Father    Heart attack Father 80       CABG    Colon cancer Mother 58   Macular degeneration Mother    Hypertension Mother    Seizures Mother        ? etiology (preceded CVA)   Stroke Mother 18   Osteoporosis Mother    Myelodysplastic syndrome Mother    Colon cancer Maternal Grandmother    Colon cancer Maternal Aunt    Colon cancer Other        2 Maternal Great Aunts   Heart attack Paternal Grandfather        > 55   Heart attack Paternal Aunt 68   Leukemia Paternal Grandmother    Diabetes Neg Hx    Colon polyps Neg Hx    Esophageal cancer Neg Hx    Rectal cancer Neg Hx    Stomach cancer Neg Hx    SOCIAL HISTORY Social History   Tobacco Use   Smoking status: Never   Smokeless tobacco: Never  Vaping Use   Vaping Use: Never used  Substance Use Topics   Alcohol use: No   Drug use: No       OPHTHALMIC EXAM: Base Eye Exam     Visual Acuity (Snellen - Linear)       Right Left   Dist cc 20/25 20/50 -2   Dist ph cc NI          Tonometry (Tonopen, 9:59 AM)       Right Left   Pressure 18 19          Pupils       Pupils Dark Light Shape React APD   Right PERRL 3 2 Round Brisk None  Left PERRL 3 2 Round Brisk None         Visual Fields       Left Right    Full Full         Extraocular Movement       Right Left    Full, Ortho Full, Ortho         Neuro/Psych     Oriented x3: Yes   Mood/Affect: Normal         Dilation     Both eyes: 2.5% Phenylephrine @ 9:59 AM           Slit Lamp and Fundus Exam     Slit Lamp Exam       Right Left   Lids/Lashes Dermatochalasis - upper lid, Meibomian gland dysfunction Dermatochalasis - upper lid   Conjunctiva/Sclera White and quiet White and quiet   Cornea arcus, well healed cataract wounds arcus, well healed cataract wounds, trace PEE   Anterior Chamber Deep and quiet Deep and quiet   Iris Round and dilated Round and dilated, +PPM   Lens PC IOL in good position with open PC PC IOL in good position   Anterior Vitreous Vitreous syneresis, Posterior vitreous detachment, mild vitreous condensations Vitreous syneresis, Posterior vitreous detachment, vitreous condensations         Fundus Exam       Right Left   Disc mild Pallor, Sharp rim, mild PPA mild Pallor, Sharp rim, mild PPA   C/D Ratio 0.5 0.6   Macula Flat, Blunted foveal reflex, RPE mottling and clumping, Drusen, focal round area of atrophy IN to fovea -- slightly increased, No heme or edema Flat, Blunted foveal reflex, RPE mottling and clumping, diffuse early atrophy, No heme or edema   Vessels attenuated, mild tortuosity attenuated, mild tortuosity   Periphery Attached, scattered pavingstone degeneration, mild reticular degeneration, No heme Attached, scattered pavingstone degeneration, mild reticular degeneration, No heme           Refraction     Wearing Rx       Sphere Cylinder Axis Add   Right -2.50 +0.75 003 +2.50   Left -0.50 +0.50 163 +2.50           IMAGING AND PROCEDURES  Imaging and Procedures for 05/07/2023  OCT, Retina - OU - Both  Eyes       Right Eye Quality was good. Central Foveal Thickness: 278. Progression has worsened. Findings include normal foveal contour, no IRF, no SRF, retinal drusen , outer retinal atrophy (Diffuse ORA, focal patch of severe thinning IN mac -- mild interval increase in GA size).   Left Eye Quality was good. Central Foveal Thickness: 263. Progression has worsened. Findings include normal foveal contour, no IRF, no SRF, retinal drusen , outer retinal atrophy (Diffuse ORA, stable improvement in central cystic changes, mild interval increase in patches of GA on en face image).   Notes *Images captured and stored on drive  Diagnosis / Impression:  Non-exudative ARMD OU OD: Diffuse ORA, focal patch of severe thinning IN mac -- mild interval increase in GA size OS: Diffuse ORA, stable improvement in central cystic changes, mild interval increase in patches of GA on en face image  Clinical management:  See below  Abbreviations: NFP - Normal foveal profile. CME - cystoid macular edema. PED - pigment epithelial detachment. IRF - intraretinal fluid. SRF - subretinal fluid. EZ - ellipsoid zone. ERM - epiretinal membrane. ORA - outer retinal atrophy. ORT - outer retinal tubulation. SRHM -  subretinal hyper-reflective material. IRHM - intraretinal hyper-reflective material             ASSESSMENT/PLAN:    ICD-10-CM   1. Intermediate stage nonexudative age-related macular degeneration of both eyes  H35.3132 OCT, Retina - OU - Both Eyes    2. 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 Essential hypertension  I10     4. Hypertensive retinopathy of both eyes  H35.033     5. Pseudophakia, both eyes  Z96.1       1,2. Age related macular degeneration, non-exudative, both eyes vs toxic maculopathy OU  - OCT shows drusen and +ORA OU -- mild progression of GA OU  - BCVA OD 20/25 (improved); OS 20/50 (worse)  - FA 4.25.22 shows staining and window defects, but no CNV OU; no vasculitis  -  Optos FAF shows circumscribed hyper and hypoautofluorescence in posterior pole around macula and disc -- very similar in appearance to Elmiron-associated macular toxicity  - pt denies history of Elmiron use and has no history of interstitial cystitis  - differential includes retinal dystrophy / inherited retinal disease, other  - discussed findings, prognosis  - pt reports her mother had severe ARMD -"Mom was blind before she died)  - no ophthalmic intervention indicated or recommended at this time -- monitor   - continue AREDS 2 supplements and Amsler grid monitoring  - f/u 6 months, DFE, OCT  3,4. Hypertensive retinopathy OU - discussed importance of tight BP control - continue to monitor  5. Pseudophakia OU  - s/p CE/IOL OU  - IOsL in good position, doing well  - continue to monitor   Ophthalmic Meds Ordered this visit:  No orders of the defined types were placed in this encounter.     Return in about 6 months (around 11/07/2023) for f/u non-exu ARMD OU, DFE, OCT.  There are no Patient Instructions on file for this visit.   Explained the diagnoses, plan, and follow up with the patient and they expressed understanding.  Patient expressed understanding of the importance of proper follow up care.   This document serves as a record of services personally performed by Karie Chimera, MD, PhD. It was created on their behalf by Gerilyn Nestle, COT an ophthalmic technician. The creation of this record is the provider's dictation and/or activities during the visit.    Electronically signed by:  Gerilyn Nestle, COT  04.23.24 1:10 PM  This document serves as a record of services personally performed by Karie Chimera, MD, PhD. It was created on their behalf by Glee Arvin. Manson Passey, OA an ophthalmic technician. The creation of this record is the provider's dictation and/or activities during the visit.    Electronically signed by: Glee Arvin. Manson Passey, [place] 05.07.2024 1:10 PM  Karie Chimera, M.D., Ph.D. Diseases & Surgery of the Retina and Vitreous [facility]  I have reviewed the above documentation for accuracy and completeness, and I agree with the above. Karie Chimera, M.D., Ph.D. 05/07/23 1:11 PM   Abbreviations: M myopia (nearsighted); A astigmatism; H hyperopia (farsighted); P presbyopia; Mrx spectacle prescription;  CTL contact lenses; OD right eye; OS left eye; OU both eyes  XT exotropia; ET esotropia; PEK punctate epithelial keratitis; PEE punctate epithelial erosions; DES dry eye syndrome; MGD meibomian gland dysfunction; ATs artificial tears; PFAT's preservative free artificial tears; NSC nuclear sclerotic cataract; PSC posterior subcapsular cataract; ERM epi-retinal membrane; PVD posterior vitreous detachment; RD retinal detachment; DM diabetes  mellitus; DR diabetic retinopathy; NPDR non-proliferative diabetic retinopathy; PDR proliferative diabetic retinopathy; CSME clinically significant macular edema; DME diabetic macular edema; dbh dot blot hemorrhages; CWS cotton wool spot; POAG primary open angle glaucoma; C/D cup-to-disc ratio; HVF humphrey visual field; GVF goldmann visual field; OCT optical coherence tomography; IOP intraocular pressure; BRVO Branch retinal vein occlusion; CRVO central retinal vein occlusion; CRAO central retinal artery occlusion; BRAO branch retinal artery occlusion; RT retinal tear; SB scleral buckle; PPV pars plana vitrectomy; VH Vitreous hemorrhage; PRP panretinal laser photocoagulation; IVK intravitreal kenalog; VMT vitreomacular traction; MH Macular hole;  NVD neovascularization of the disc; NVE neovascularization elsewhere; AREDS age related eye disease study; ARMD age related macular degeneration; POAG primary open angle glaucoma; EBMD epithelial/anterior basement membrane dystrophy; ACIOL anterior chamber intraocular lens; IOL intraocular lens; PCIOL posterior chamber intraocular lens; Phaco/IOL phacoemulsification  with intraocular lens placement; PRK photorefractive keratectomy; LASIK laser assisted in situ keratomileusis; HTN hypertension; DM diabetes mellitus; COPD chronic obstructive pulmonary disease

## 2023-05-07 ENCOUNTER — Encounter (INDEPENDENT_AMBULATORY_CARE_PROVIDER_SITE_OTHER): Payer: Self-pay | Admitting: Ophthalmology

## 2023-05-07 ENCOUNTER — Ambulatory Visit (INDEPENDENT_AMBULATORY_CARE_PROVIDER_SITE_OTHER): Payer: Medicare Other | Admitting: Ophthalmology

## 2023-05-07 DIAGNOSIS — Z961 Presence of intraocular lens: Secondary | ICD-10-CM

## 2023-05-07 DIAGNOSIS — H35033 Hypertensive retinopathy, bilateral: Secondary | ICD-10-CM

## 2023-05-07 DIAGNOSIS — H35383 Toxic maculopathy, bilateral: Secondary | ICD-10-CM | POA: Diagnosis not present

## 2023-05-07 DIAGNOSIS — I10 Essential (primary) hypertension: Secondary | ICD-10-CM | POA: Diagnosis not present

## 2023-05-07 DIAGNOSIS — H353132 Nonexudative age-related macular degeneration, bilateral, intermediate dry stage: Secondary | ICD-10-CM

## 2023-06-09 ENCOUNTER — Encounter: Payer: Self-pay | Admitting: Internal Medicine

## 2023-06-09 NOTE — Progress Notes (Unsigned)
Subjective:    Patient ID: Jane Montgomery, female    DOB: [birth year], 73 y.o.   MRN: [id number]      HPI Jane Montgomery is here for a Physical exam and her chronic medical problems.    Increased OA - left knee, right knee, left ankle and lower back.  Takes tylenol, occ advil.   Cut on left thumb a couple of days ago.  It bled a lot.  No redness.  Medications and allergies reviewed with patient and updated if appropriate.  Current Outpatient Medications on File Prior to Visit  Medication Sig Dispense Refill   acetaminophen (TYLENOL) 325 MG tablet Take 650 mg by mouth every 6 (six) hours as needed.     alendronate (FOSAMAX) 70 MG tablet TAKE 1 TABLET(70 MG) BY MOUTH EVERY 7 DAYS WITH A FULL GLASS OF WATER AND ON AN EMPTY STOMACH 4 tablet 11   amLODipine (NORVASC) 5 MG tablet TAKE 1 TABLET(5 MG) BY MOUTH EVERY MORNING 90 tablet 1   amoxicillin (AMOXIL) 500 MG tablet TK 4 TS PO 1 HOUR PRIOR TO APPOINTMENT  0   aspirin 81 MG tablet Take 81 mg by mouth daily.     Azelastine-Fluticasone 137-50 MCG/ACT SUSP 1 spray in each nostril     benazepril (LOTENSIN) 20 MG tablet TAKE 1 TABLET(20 MG) BY MOUTH DAILY 90 tablet 1   Biotin 40981 MCG TABS Take 1 tablet by mouth daily.     calcium carbonate (OS-CAL) 1250 (500 Ca) MG chewable tablet Chew 1 tablet by mouth daily.     cholecalciferol (VITAMIN D) 1000 units tablet Take 3,000 Units by mouth daily.     Coenzyme Q10 (CO Q-10) 100 MG CAPS Take 1 capsule by mouth daily.     Collagen-Boron-Hyaluronic Acid (MOVE FREE ULTRA JOINT HEALTH PO) Take 1 tablet by mouth daily.     desloratadine (CLARINEX) 5 MG tablet Take 5 mg by mouth daily.     latanoprost (XALATAN) 0.005 % ophthalmic solution SMARTSIG:1 Drop(s) In Eye(s) Every Evening     montelukast (SINGULAIR) 10 MG tablet Take 10 mg by mouth at bedtime.     Multiple Vitamins-Minerals (CENTRUM SILVER 50+WOMEN) TABS Take 1 tablet by mouth daily.     Multiple Vitamins-Minerals (PRESERVISION AREDS 2 PO) Take 1  capsule by mouth in the morning and at bedtime.     Probiotic Product (PROBIOTIC DAILY PO) Take 1 capsule by mouth daily.     rosuvastatin (CRESTOR) 20 MG tablet TAKE 1 TABLET(20 MG) BY MOUTH DAILY 90 tablet 3   sertraline (ZOLOFT) 100 MG tablet TAKE 1 TABLET(100 MG) BY MOUTH DAILY 90 tablet 1   triamcinolone ointment (KENALOG) 0.1 % apply sparingly to affected areas     No current facility-administered medications on file prior to visit.    Review of Systems  Constitutional:  Negative for fever.  Eyes:  Negative for visual disturbance.  Respiratory:  Positive for shortness of breath (with moderate exertion). Negative for cough and wheezing.   Cardiovascular:  Negative for chest pain, palpitations and leg swelling.  Gastrointestinal:  Negative for abdominal pain, blood in stool, constipation and diarrhea.       No gerd  Genitourinary:  Negative for dysuria.  Musculoskeletal:  Positive for arthralgias and back pain.  Skin:  Negative for rash.  Neurological:  Negative for light-headedness and headaches.  Psychiatric/Behavioral:  Negative for dysphoric mood. The patient is not nervous/anxious.        Objective:   Vitals:  06/10/23 0952  BP: 122/66  Pulse: 76  Temp: 97.6 F (36.4 C)  SpO2: 94%   Filed Weights   06/10/23 0952  Weight: 257 lb 2 oz (116.6 kg)   Body mass index is 47.03 kg/m.  BP Readings from Last 3 Encounters:  06/10/23 122/66  03/07/23 120/60  12/05/22 132/70    Wt Readings from Last 3 Encounters:  06/10/23 257 lb 2 oz (116.6 kg)  03/07/23 258 lb 12.8 oz (117.4 kg)  12/05/22 255 lb (115.7 kg)       Physical Exam Constitutional: She appears well-developed and well-nourished. No distress.  HENT:  Head: Normocephalic and atraumatic.  Right Ear: External ear normal. Normal ear canal and TM Left Ear: External ear normal.  Normal ear canal and TM Mouth/Throat: Oropharynx is clear and moist.  Eyes: Conjunctivae normal.  Neck: Neck supple. No  tracheal deviation present. No thyromegaly present.  No carotid bruit  Cardiovascular: Normal rate, regular rhythm and normal heart sounds.   No murmur heard.  No edema. Pulmonary/Chest: Effort normal and breath sounds normal. No respiratory distress. She has no wheezes. She has no rales.  Breast: deferred   Abdominal: Soft. She exhibits no distension. There is no tenderness.  Lymphadenopathy: She has no cervical adenopathy.  Skin: Skin is warm and dry. She is not diaphoretic.  Psychiatric: She has a normal mood and affect. Her behavior is normal.     Lab Results  Component Value Date   WBC 6.6 06/06/2022   HGB 13.2 06/06/2022   HCT 40.5 06/06/2022   PLT 198.0 06/06/2022   GLUCOSE 114 (H) 12/05/2022   CHOL 169 12/05/2022   TRIG 258.0 (H) 12/05/2022   HDL 62.60 12/05/2022   LDLDIRECT 74.0 12/05/2022   LDLCALC 106 (H) 06/03/2020   ALT 24 12/05/2022   AST 22 12/05/2022   NA 138 12/05/2022   K 4.5 12/05/2022   CL 99 12/05/2022   CREATININE 0.54 12/05/2022   BUN 21 12/05/2022   CO2 32 12/05/2022   TSH 1.23 12/06/2020   INR 0.98 10/04/2015   HGBA1C 6.5 12/05/2022   MICROALBUR 2.5 (H) 06/06/2022         Assessment & Plan:   Physical exam: Screening blood work  ordered Exercise  chair exercises Weight  encouraged weight loss Substance abuse  none   Reviewed recommended immunizations.  Td given today   Health Maintenance  Topic Date Due   FOOT EXAM  10/07/2019   DTaP/Tdap/Td (2 - Td or Tdap) 08/13/2022   Diabetic kidney evaluation - Urine ACR  06/07/2023   DEXA SCAN  06/08/2023   HEMOGLOBIN A1C  06/06/2023   Colonoscopy  07/15/2023   INFLUENZA VACCINE  08/01/2023   OPHTHALMOLOGY EXAM  11/22/2023   Diabetic kidney evaluation - eGFR measurement  12/06/2023   Medicare Annual Wellness (AWV)  03/06/2024   MAMMOGRAM  10/13/2024   Pneumonia Vaccine 63+ Years old  Completed   Hepatitis C Screening  Completed   Zoster Vaccines- Shingrix  Completed   HPV VACCINES   Aged Out   COVID-19 Vaccine  Discontinued          See Problem List for Assessment and Plan of chronic medical problems.

## 2023-06-09 NOTE — Patient Instructions (Addendum)
An EKG was done.   Blood work was ordered.   The lab is on the first floor.    Medications changes include :       A referral was ordered and someone will call you to schedule an appointment.     Return in about 6 months (around 12/10/2023) for follow up.   Health Maintenance, Female Adopting a healthy lifestyle and getting preventive care are important in promoting health and wellness. Ask your health care provider about: The right schedule for you to have regular tests and exams. Things you can do on your own to prevent diseases and keep yourself healthy. What should I know about diet, weight, and exercise? Eat a healthy diet  Eat a diet that includes plenty of vegetables, fruits, low-fat dairy products, and lean protein. Do not eat a lot of foods that are high in solid fats, added sugars, or sodium. Maintain a healthy weight Body mass index (BMI) is used to identify weight problems. It estimates body fat based on height and weight. Your health care provider can help determine your BMI and help you achieve or maintain a healthy weight. Get regular exercise Get regular exercise. This is one of the most important things you can do for your health. Most adults should: Exercise for at least 150 minutes each week. The exercise should increase your heart rate and make you sweat (moderate-intensity exercise). Do strengthening exercises at least twice a week. This is in addition to the moderate-intensity exercise. Spend less time sitting. Even light physical activity can be beneficial. Watch cholesterol and blood lipids Have your blood tested for lipids and cholesterol at 71 years of age, then have this test every 5 years. Have your cholesterol levels checked more often if: Your lipid or cholesterol levels are high. You are older than 71 years of age. You are at high risk for heart disease. What should I know about cancer screening? Depending on your health history and family  history, you may need to have cancer screening at various ages. This may include screening for: Breast cancer. Cervical cancer. Colorectal cancer. Skin cancer. Lung cancer. What should I know about heart disease, diabetes, and high blood pressure? Blood pressure and heart disease High blood pressure causes heart disease and increases the risk of stroke. This is more likely to develop in people who have high blood pressure readings or are overweight. Have your blood pressure checked: Every 3-5 years if you are 48-55 years of age. Every year if you are 4 years old or older. Diabetes Have regular diabetes screenings. This checks your fasting blood sugar level. Have the screening done: Once every three years after age 108 if you are at a normal weight and have a low risk for diabetes. More often and at a younger age if you are overweight or have a high risk for diabetes. What should I know about preventing infection? Hepatitis B If you have a higher risk for hepatitis B, you should be screened for this virus. Talk with your health care provider to find out if you are at risk for hepatitis B infection. Hepatitis C Testing is recommended for: Everyone born from [birth year] through 1963. Anyone with known risk factors for hepatitis C. Sexually transmitted infections (STIs) Get screened for STIs, including gonorrhea and chlamydia, if: You are sexually active and are younger than 71 years of age. You are older than 71 years of age and your health care provider tells you that you are at risk  for this type of infection. Your sexual activity has changed since you were last screened, and you are at increased risk for chlamydia or gonorrhea. Ask your health care provider if you are at risk. Ask your health care provider about whether you are at high risk for HIV. Your health care provider may recommend a prescription medicine to help prevent HIV infection. If you choose to take medicine to prevent HIV, you  should first get tested for HIV. You should then be tested every 3 months for as long as you are taking the medicine. Pregnancy If you are about to stop having your period (premenopausal) and you may become pregnant, seek counseling before you get pregnant. Take 400 to 800 micrograms (mcg) of folic acid every day if you become pregnant. Ask for birth control (contraception) if you want to prevent pregnancy. Osteoporosis and menopause Osteoporosis is a disease in which the bones lose minerals and strength with aging. This can result in bone fractures. If you are 53 years old or older, or if you are at risk for osteoporosis and fractures, ask your health care provider if you should: Be screened for bone loss. Take a calcium or vitamin D supplement to lower your risk of fractures. Be given hormone replacement therapy (HRT) to treat symptoms of menopause. Follow these instructions at home: Alcohol use Do not drink alcohol if: Your health care provider tells you not to drink. You are pregnant, may be pregnant, or are planning to become pregnant. If you drink alcohol: Limit how much you have to: 0-1 drink a day. Know how much alcohol is in your drink. In the [place] one drink equals one 12 oz bottle of beer (355 mL), one 5 oz glass of wine (148 mL), or one 1 oz glass of hard liquor (44 mL). Lifestyle Do not use any products that contain nicotine or tobacco. These products include cigarettes, chewing tobacco, and vaping devices, such as e-cigarettes. If you need help quitting, ask your health care provider. Do not use street drugs. Do not share needles. Ask your health care provider for help if you need support or information about quitting drugs. General instructions Schedule regular health, dental, and eye exams. Stay current with your vaccines. Tell your health care provider if: You often feel depressed. You have ever been abused or do not feel safe at home. Summary Adopting a healthy  lifestyle and getting preventive care are important in promoting health and wellness. Follow your health care provider's instructions about healthy diet, exercising, and getting tested or screened for diseases. Follow your health care provider's instructions on monitoring your cholesterol and blood pressure. This information is not intended to replace advice given to you by your health care provider. Make sure you discuss any questions you have with your health care provider. Document Revised: 05/08/2021 Document Reviewed: 05/08/2021 Elsevier Patient Education  2024 [employer].

## 2023-06-10 ENCOUNTER — Ambulatory Visit (INDEPENDENT_AMBULATORY_CARE_PROVIDER_SITE_OTHER): Payer: Medicare Other | Admitting: Internal Medicine

## 2023-06-10 VITALS — BP 122/66 | HR 76 | Temp 97.6°F | Ht 62.0 in | Wt 257.1 lb

## 2023-06-10 DIAGNOSIS — H409 Unspecified glaucoma: Secondary | ICD-10-CM | POA: Insufficient documentation

## 2023-06-10 DIAGNOSIS — Z23 Encounter for immunization: Secondary | ICD-10-CM

## 2023-06-10 DIAGNOSIS — G4733 Obstructive sleep apnea (adult) (pediatric): Secondary | ICD-10-CM

## 2023-06-10 DIAGNOSIS — M810 Age-related osteoporosis without current pathological fracture: Secondary | ICD-10-CM

## 2023-06-10 DIAGNOSIS — I10 Essential (primary) hypertension: Secondary | ICD-10-CM | POA: Diagnosis not present

## 2023-06-10 DIAGNOSIS — E7849 Other hyperlipidemia: Secondary | ICD-10-CM | POA: Diagnosis not present

## 2023-06-10 DIAGNOSIS — Z0000 Encounter for general adult medical examination without abnormal findings: Secondary | ICD-10-CM

## 2023-06-10 DIAGNOSIS — E119 Type 2 diabetes mellitus without complications: Secondary | ICD-10-CM

## 2023-06-10 DIAGNOSIS — F3289 Other specified depressive episodes: Secondary | ICD-10-CM

## 2023-06-10 DIAGNOSIS — S61012A Laceration without foreign body of left thumb without damage to nail, initial encounter: Secondary | ICD-10-CM

## 2023-06-10 DIAGNOSIS — E559 Vitamin D deficiency, unspecified: Secondary | ICD-10-CM | POA: Diagnosis not present

## 2023-06-10 DIAGNOSIS — Z0001 Encounter for general adult medical examination with abnormal findings: Secondary | ICD-10-CM

## 2023-06-10 LAB — CBC WITH DIFFERENTIAL/PLATELET
Basophils Absolute: 0 10*3/uL (ref 0.0–0.1)
Basophils Relative: 0.6 % (ref 0.0–3.0)
Eosinophils Absolute: 0.1 10*3/uL (ref 0.0–0.7)
Eosinophils Relative: 2 % (ref 0.0–5.0)
HCT: 41.5 % (ref 36.0–46.0)
Hemoglobin: 13.3 g/dL (ref 12.0–15.0)
Lymphocytes Relative: 25.9 % (ref 12.0–46.0)
Lymphs Abs: 1.6 10*3/uL (ref 0.7–4.0)
MCHC: 32 g/dL (ref 30.0–36.0)
MCV: 88.8 fl (ref 78.0–100.0)
Monocytes Absolute: 0.3 10*3/uL (ref 0.1–1.0)
Monocytes Relative: 5.3 % (ref 3.0–12.0)
Neutro Abs: 4.1 10*3/uL (ref 1.4–7.7)
Neutrophils Relative %: 66.2 % (ref 43.0–77.0)
Platelets: 206 10*3/uL (ref 150.0–400.0)
RBC: 4.67 Mil/uL (ref 3.87–5.11)
RDW: 15.3 % (ref 11.5–15.5)
WBC: 6.2 10*3/uL (ref 4.0–10.5)

## 2023-06-10 LAB — COMPREHENSIVE METABOLIC PANEL
ALT: 23 U/L (ref 0–35)
AST: 19 U/L (ref 0–37)
Albumin: 4.6 g/dL (ref 3.5–5.2)
Alkaline Phosphatase: 80 U/L (ref 39–117)
BUN: 20 mg/dL (ref 6–23)
CO2: 29 mEq/L (ref 19–32)
Calcium: 9.5 mg/dL (ref 8.4–10.5)
Chloride: 101 mEq/L (ref 96–112)
Creatinine, Ser: 0.57 mg/dL (ref 0.40–1.20)
GFR: 91.49 mL/min (ref >60.00)
Glucose, Bld: 121 mg/dL — ABNORMAL HIGH (ref 70–99)
Potassium: 4.9 mEq/L (ref 3.5–5.1)
Sodium: 141 mEq/L (ref 135–145)
Total Bilirubin: 1.1 mg/dL (ref 0.2–1.2)
Total Protein: 7.5 g/dL (ref 6.0–8.3)

## 2023-06-10 LAB — HEMOGLOBIN A1C: Hgb A1c MFr Bld: 6.3 % (ref 4.6–6.5)

## 2023-06-10 LAB — MICROALBUMIN / CREATININE URINE RATIO
Creatinine,U: 337.1 mg/dL
Microalb Creat Ratio: 1.3 mg/g (ref 0.0–30.0)
Microalb, Ur: 4.4 mg/dL — ABNORMAL HIGH (ref 0.0–1.9)

## 2023-06-10 LAB — LIPID PANEL
Cholesterol: 167 mg/dL (ref 0–200)
HDL: 60.5 mg/dL (ref >39.00)
NonHDL: 106.3
Total CHOL/HDL Ratio: 3
Triglycerides: 288 mg/dL — ABNORMAL HIGH (ref 0.0–149.0)
VLDL: 57.6 mg/dL — ABNORMAL HIGH (ref 0.0–40.0)

## 2023-06-10 LAB — VITAMIN D 25 HYDROXY (VIT D DEFICIENCY, FRACTURES): VITD: 48.71 ng/mL (ref 30.00–100.00)

## 2023-06-10 LAB — LDL CHOLESTEROL, DIRECT: Direct LDL: 80 mg/dL

## 2023-06-10 NOTE — Assessment & Plan Note (Signed)
Chronic   Lab Results  Component Value Date   HGBA1C 6.5 12/05/2022   Sugars well controlled Check A1c, urine microalbumin Continue lifestyle control Stressed regular exercise, diabetic diet

## 2023-06-10 NOTE — Assessment & Plan Note (Addendum)
Chronic Dexa due-ordered Continue Fosamax 70 mg weekly-started 05/2021-plan to continue for 5 years Continue taking calcium and vitamin d daily Encouraged regular exercise Check vitamin D level

## 2023-06-10 NOTE — Assessment & Plan Note (Signed)
Uses cpap nightly  

## 2023-06-10 NOTE — Assessment & Plan Note (Signed)
Chronic Taking vitamin d daily Check vitamin d level  

## 2023-06-10 NOTE — Assessment & Plan Note (Signed)
Acute Laceration on thumb couple of days ago on the top of the a can It bled a lot initially, but no swelling or redness at this time No evidence of infection Due for tetanus-will give Td Monitor for signs of infection

## 2023-06-10 NOTE — Assessment & Plan Note (Addendum)
Chronic Blood pressure well controlled CMP Continue amlodipine 5 mg daily, benazepril 20 mg daily  EKG - NSR at 65 bpm, normal EKG - no change compared to prior EKG from 2016

## 2023-06-10 NOTE — Assessment & Plan Note (Signed)
Chronic Check lipid panel  Continue crestor 20 mg daily Regular exercise and healthy diet encouraged  

## 2023-06-10 NOTE — Assessment & Plan Note (Signed)
Chronic Controlled, stable Continue  sertraline 100 mg daily 

## 2023-07-10 ENCOUNTER — Inpatient Hospital Stay: Admission: RE | Admit: 2023-07-10 | Payer: Medicare Other | Source: Ambulatory Visit

## 2023-07-17 ENCOUNTER — Ambulatory Visit (INDEPENDENT_AMBULATORY_CARE_PROVIDER_SITE_OTHER)
Admission: RE | Admit: 2023-07-17 | Discharge: 2023-07-17 | Disposition: A | Discharge status: Home / Self Care | Payer: Medicare Other | Source: Ambulatory Visit | Attending: Internal Medicine | Admitting: Internal Medicine

## 2023-07-17 DIAGNOSIS — M810 Age-related osteoporosis without current pathological fracture: Secondary | ICD-10-CM | POA: Diagnosis not present

## 2023-08-09 ENCOUNTER — Other Ambulatory Visit: Payer: Self-pay | Admitting: Internal Medicine

## 2023-09-19 ENCOUNTER — Other Ambulatory Visit: Payer: Self-pay | Admitting: Internal Medicine

## 2023-10-19 LAB — HM MAMMOGRAPHY

## 2023-10-22 ENCOUNTER — Encounter: Payer: Self-pay | Admitting: Internal Medicine

## 2023-10-23 ENCOUNTER — Encounter: Payer: Self-pay | Admitting: Internal Medicine

## 2023-11-07 ENCOUNTER — Other Ambulatory Visit: Payer: Self-pay | Admitting: Internal Medicine

## 2023-11-07 NOTE — Progress Notes (Signed)
[facility] - Clinic Note  11/08/2023     CHIEF COMPLAINT Patient presents for Retina Follow Up    HISTORY OF PRESENT ILLNESS: Jane Montgomery is a 72 y.o. female who presents to the clinic today for:   HPI     Retina Follow Up   Patient presents with  Dry AMD.  In both eyes.  This started 6 months ago.  I, the attending physician,  performed the HPI with the patient and updated documentation appropriately.        Comments   Patient here for 6 months retina follow up for non exu ARMD OU. Patient states vision about the same. No eye pain. Uses Latanoprost QHS OU.       Last edited by Rennis Chris, MD on 11/10/2023  2:01 AM.    Pt states vision is stable, pt states she occasionally checks her amsler grid   Referring physician: Pincus Sanes, MD [address] [place],  [place] [address]  HISTORICAL INFORMATION:   Selected notes from the MEDICAL RECORD NUMBER Referred by Dr. Alben Spittle for eval of GA paracentral mac   CURRENT MEDICATIONS: Current Outpatient Medications (Ophthalmic Drugs)  Medication Sig   latanoprost (XALATAN) 0.005 % ophthalmic solution SMARTSIG:1 Drop(s) In Eye(s) Every Evening   No current facility-administered medications for this visit. (Ophthalmic Drugs)   Current Outpatient Medications (Other)  Medication Sig   acetaminophen (TYLENOL) 325 MG tablet Take 650 mg by mouth every 6 (six) hours as needed.   alendronate (FOSAMAX) 70 MG tablet TAKE 1 TABLET(70 MG) BY MOUTH EVERY 7 DAYS WITH A FULL GLASS OF WATER AND ON AN EMPTY STOMACH   amLODipine (NORVASC) 5 MG tablet TAKE 1 TABLET(5 MG) BY MOUTH EVERY MORNING   amoxicillin (AMOXIL) 500 MG tablet TK 4 TS PO 1 HOUR PRIOR TO APPOINTMENT   aspirin 81 MG tablet Take 81 mg by mouth daily.   Azelastine-Fluticasone 137-50 MCG/ACT SUSP 1 spray in each nostril   benazepril (LOTENSIN) 20 MG tablet TAKE 1 TABLET(20 MG) BY MOUTH DAILY   Biotin 86578 MCG TABS Take 1 tablet by mouth daily.    calcium carbonate (OS-CAL) 1250 (500 Ca) MG chewable tablet Chew 1 tablet by mouth daily.   cholecalciferol (VITAMIN D) 1000 units tablet Take 3,000 Units by mouth daily.   Coenzyme Q10 (CO Q-10) 100 MG CAPS Take 1 capsule by mouth daily.   Collagen-Boron-Hyaluronic Acid (MOVE FREE ULTRA JOINT HEALTH PO) Take 1 tablet by mouth daily.   desloratadine (CLARINEX) 5 MG tablet Take 5 mg by mouth daily.   montelukast (SINGULAIR) 10 MG tablet Take 10 mg by mouth at bedtime.   Multiple Vitamins-Minerals (CENTRUM SILVER 50+WOMEN) TABS Take 1 tablet by mouth daily.   Multiple Vitamins-Minerals (PRESERVISION AREDS 2 PO) Take 1 capsule by mouth in the morning and at bedtime.   Probiotic Product (PROBIOTIC DAILY PO) Take 1 capsule by mouth daily.   rosuvastatin (CRESTOR) 20 MG tablet TAKE 1 TABLET(20 MG) BY MOUTH DAILY   sertraline (ZOLOFT) 100 MG tablet TAKE 1 TABLET(100 MG) BY MOUTH DAILY   triamcinolone ointment (KENALOG) 0.1 % apply sparingly to affected areas   No current facility-administered medications for this visit. (Other)   REVIEW OF SYSTEMS: ROS   Positive for: Gastrointestinal, Neurological, Musculoskeletal, Endocrine, Cardiovascular, Eyes, Respiratory Negative for: Constitutional, Skin, Genitourinary, HENT, Psychiatric, Allergic/Imm, Heme/Lymph Last edited by Laddie Aquas, COA on 11/08/2023  9:56 AM.     ALLERGIES No Known Allergies  PAST MEDICAL HISTORY Past Medical History:  Diagnosis Date   Allergy    Anisocoria    evaluated by Dr Sandria Manly 2005   Arthritis    Benign positional vertigo    in LLDP   Chronic back pain    reason unknown    Depression    takes Zoloft daily- hormone imbalance - not truly depression per pt    Diarrhea    Diverticulosis    Gestational diabetes 1982, 1984   "pre"   Sullivan Lone syndrome    Heart murmur    Hyperlipidemia    takes Pravastatin daily   Hypertension    takes Amlodipine daily as well as Benazepril   Hypertensive retinopathy    Joint  pain    Macular degeneration    early stages and dry   Nocturia    Osteoporosis    Shortness of breath dyspnea    allergy related and occasionally will take Claritin.   Sleep apnea    has cpap   Tremor, essential    Dr Sandria Manly   Past Surgical History:  Procedure Laterality Date   CATARACT EXTRACTION Bilateral 12/31/2013   CESAREAN SECTION     x 2   COLONOSCOPY  [phone]   Negative, redundant colon; [facility]   DILATION AND CURETTAGE OF UTERUS     Dr Rosalio Macadamia   EYE SURGERY     PLANTAR FASCIA SURGERY Bilateral    TONSILLECTOMY     TOTAL SHOULDER ARTHROPLASTY Right 10/13/2015   Procedure: TOTAL SHOULDER ARTHROPLASTY;  Surgeon: Jones Broom, MD;  Location: [facility] OR;  Service: Orthopedics;  Laterality: Right;  Right total shoulder arthroplasty    FAMILY HISTORY Family History  Problem Relation Age of Onset   Hypertension Father    Heart attack Father 40       CABG    Colon cancer Mother 21   Macular degeneration Mother    Hypertension Mother    Seizures Mother        ? etiology (preceded CVA)   Stroke Mother 92   Osteoporosis Mother    Myelodysplastic syndrome Mother    Colon cancer Maternal Grandmother    Colon cancer Maternal Aunt    Colon cancer Other        2 Maternal Great Aunts   Heart attack Paternal Grandfather        > 55   Heart attack Paternal Aunt 38   Leukemia Paternal Grandmother    Diabetes Neg Hx    Colon polyps Neg Hx    Esophageal cancer Neg Hx    Rectal cancer Neg Hx    Stomach cancer Neg Hx    SOCIAL HISTORY Social History   Tobacco Use   Smoking status: Never   Smokeless tobacco: Never  Vaping Use   Vaping status: Never Used  Substance Use Topics   Alcohol use: No   Drug use: No       OPHTHALMIC EXAM: Base Eye Exam     Visual Acuity (Snellen - Linear)       Right Left   Dist cc 20/25 -2 20/40 -1   Dist ph cc NI NI    Correction: Glasses         Tonometry (Tonopen, 9:53 AM)       Right Left   Pressure 14 15          Pupils       Dark Light Shape React APD   Right 3 2 Round Brisk None  Left 3 2 Round Brisk None         Visual Fields (Counting fingers)       Left Right    Full Full         Extraocular Movement       Right Left    Full, Ortho Full, Ortho         Neuro/Psych     Oriented x3: Yes   Mood/Affect: Normal         Dilation     Both eyes: 1.0% Mydriacyl, 2.5% Phenylephrine @ 9:53 AM           Slit Lamp and Fundus Exam     Slit Lamp Exam       Right Left   Lids/Lashes Dermatochalasis - upper lid, Meibomian gland dysfunction Dermatochalasis - upper lid   Conjunctiva/Sclera White and quiet White and quiet   Cornea arcus, well healed cataract wounds arcus, well healed cataract wounds, trace PEE   Anterior Chamber Deep and quiet Deep and quiet   Iris Round and moderately dilated Round and dilated, +PPM   Lens PC IOL in good position with open PC PC IOL in good position   Anterior Vitreous Vitreous syneresis, Posterior vitreous detachment, mild vitreous condensations Vitreous syneresis, Posterior vitreous detachment, vitreous condensations         Fundus Exam       Right Left   Disc mild Pallor, Sharp rim, mild PPA mild Pallor, Sharp rim, mild PPA   C/D Ratio 0.5 0.6   Macula Flat, Blunted foveal reflex, RPE mottling and clumping, Drusen, focal round area of atrophy IN to fovea -- slightly increased, No heme or edema Flat, Blunted foveal reflex, RPE mottling and clumping, diffuse early atrophy, focal GA SN macula -- slightly increased, No heme or edema   Vessels attenuated, mild tortuosity attenuated, mild tortuosity   Periphery Attached, scattered pavingstone degeneration, mild reticular degeneration, No heme Attached, scattered pavingstone degeneration, mild reticular degeneration, No heme           Refraction     Wearing Rx       Sphere Cylinder Axis Add   Right -2.50 +0.75 003 +2.50   Left -0.50 +0.50 163 +2.50            IMAGING AND PROCEDURES  Imaging and Procedures for 11/08/2023  OCT, Retina - OU - Both Eyes       Right Eye Quality was good. Central Foveal Thickness: 268. Progression has been stable. Findings include normal foveal contour, no IRF, no SRF, retinal drusen , outer retinal atrophy (Diffuse ORA, focal patch of severe thinning IN mac -- stable GA).   Left Eye Quality was good. Central Foveal Thickness: 253. Progression has worsened. Findings include normal foveal contour, no IRF, no SRF, retinal drusen , outer retinal atrophy (Diffuse ORA, stable improvement in central cystic changes, mild interval increase in patches of GA on en face images).   Notes *Images captured and stored on drive  Diagnosis / Impression:  Non-exudative ARMD OU OD: Diffuse ORA, focal patch of severe thinning IN mac -- mild interval increase in GA size OS: Diffuse ORA, stable improvement in central cystic changes, mild interval increase in patches of GA on en face images  Clinical management:  See below  Abbreviations: NFP - Normal foveal profile. CME - cystoid macular edema. PED - pigment epithelial detachment. IRF - intraretinal fluid. SRF - subretinal fluid. EZ - ellipsoid zone. ERM - epiretinal membrane. ORA - outer retinal  atrophy. ORT - outer retinal tubulation. SRHM - subretinal hyper-reflective material. IRHM - intraretinal hyper-reflective material            ASSESSMENT/PLAN:    ICD-10-CM   1. Intermediate stage nonexudative age-related macular degeneration of both eyes  H35.3132 OCT, Retina - OU - Both Eyes    2. 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 Essential hypertension  I10     4. Hypertensive retinopathy of both eyes  H35.033     5. Pseudophakia, both eyes  Z96.1      1,2. 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 Hypertensive retinopathy OU - discussed importance of tight BP control - continue to monitor  5. Pseudophakia OU  - s/p CE/IOL OU  - IOsL in good position, doing well  - continue to monitor   Ophthalmic Meds Ordered this visit:  No orders of the defined types were placed in this encounter.    Return in about 6 months (around 05/07/2024) for f/u non-exu ARMD OU, DFE, OCT.  There are no Patient Instructions on file for this visit.   This document serves as a record of services personally performed by Karie Chimera, MD, PhD. It was created on their behalf by Berlin Hun COT, an ophthalmic technician. The creation of this record is the provider's dictation and/or activities during the visit.    Electronically signed by: Berlin Hun COT 11.07.242:02 AM  This document serves as a record of services personally performed by Karie Chimera, MD, PhD. It was created on their behalf by Glee Arvin. Manson Passey, OA an ophthalmic technician. The creation of this record is the provider's dictation and/or activities during the visit.    Electronically signed by: Glee Arvin. Manson Passey, OA 11/10/23 2:02 AM  Karie Chimera, M.D., Ph.D. Diseases & Surgery of the Retina and Vitreous [facility] 11/08/2023   I  have reviewed the above documentation for accuracy and completeness, and I agree with the above. Karie Chimera, M.D., Ph.D. 11/10/23 2:10 AM   Abbreviations: M myopia (nearsighted); A astigmatism; H hyperopia (farsighted); P presbyopia; Mrx spectacle prescription;  CTL contact lenses; OD right eye; OS left eye; OU both eyes  XT exotropia; ET esotropia; PEK punctate epithelial keratitis; PEE punctate epithelial erosions; DES dry eye syndrome; MGD meibomian gland dysfunction; ATs artificial tears; PFAT's preservative free artificial tears; NSC nuclear sclerotic cataract; PSC posterior subcapsular cataract; ERM epi-retinal membrane; PVD posterior vitreous detachment; RD retinal detachment; DM diabetes mellitus; DR diabetic retinopathy; NPDR non-proliferative diabetic retinopathy; PDR proliferative diabetic retinopathy; CSME clinically significant macular edema; DME diabetic macular edema; dbh dot  blot hemorrhages; CWS cotton wool spot; POAG primary open angle glaucoma; C/D cup-to-disc ratio; HVF humphrey visual field; GVF goldmann visual field; OCT optical coherence tomography; IOP intraocular pressure; BRVO Branch retinal vein occlusion; CRVO central retinal vein occlusion; CRAO central retinal artery occlusion; BRAO branch retinal artery occlusion; RT retinal tear; SB scleral buckle; PPV pars plana vitrectomy; VH Vitreous hemorrhage; PRP panretinal laser photocoagulation; IVK intravitreal kenalog; VMT vitreomacular traction; MH Macular hole;  NVD neovascularization of the disc; NVE neovascularization elsewhere; AREDS age related eye disease study; ARMD age related macular degeneration; POAG primary open angle glaucoma; EBMD epithelial/anterior basement membrane dystrophy; ACIOL anterior chamber intraocular lens; IOL intraocular lens; PCIOL posterior chamber intraocular lens; Phaco/IOL phacoemulsification with intraocular lens placement; PRK photorefractive keratectomy; LASIK laser assisted in situ  keratomileusis; HTN hypertension; DM diabetes mellitus; COPD chronic obstructive pulmonary disease

## 2023-11-08 ENCOUNTER — Encounter (INDEPENDENT_AMBULATORY_CARE_PROVIDER_SITE_OTHER): Payer: Self-pay | Admitting: Ophthalmology

## 2023-11-08 ENCOUNTER — Ambulatory Visit (INDEPENDENT_AMBULATORY_CARE_PROVIDER_SITE_OTHER): Payer: Medicare Other | Admitting: Ophthalmology

## 2023-11-08 DIAGNOSIS — H35033 Hypertensive retinopathy, bilateral: Secondary | ICD-10-CM

## 2023-11-08 DIAGNOSIS — H35383 Toxic maculopathy, bilateral: Secondary | ICD-10-CM

## 2023-11-08 DIAGNOSIS — I10 Essential (primary) hypertension: Secondary | ICD-10-CM | POA: Diagnosis not present

## 2023-11-08 DIAGNOSIS — H353132 Nonexudative age-related macular degeneration, bilateral, intermediate dry stage: Secondary | ICD-10-CM

## 2023-11-08 DIAGNOSIS — Z961 Presence of intraocular lens: Secondary | ICD-10-CM

## 2023-11-10 ENCOUNTER — Encounter (INDEPENDENT_AMBULATORY_CARE_PROVIDER_SITE_OTHER): Payer: Self-pay | Admitting: Ophthalmology

## 2023-12-09 NOTE — Patient Instructions (Addendum)
     Blood work was ordered.       Medications changes include :   None    A referral was ordered and someone will call you to schedule an appointment.     Return in about 6 months (around 06/09/2024) for Physical Exam.

## 2023-12-09 NOTE — Progress Notes (Unsigned)
Subjective:    Patient ID: Jane Montgomery, female    DOB: [birth year], 73 y.o.   MRN: [id number]     HPI Khadijat is here for follow up of her chronic medical problems.  Left knee starting to hurt like the right.  Has done injections for the right knee - has not helped much.  Would get knee replacement but needs to lose weight.  Has sinus drainage.    Medications and allergies reviewed with patient and updated if appropriate.  Current Outpatient Medications on File Prior to Visit  Medication Sig Dispense Refill   acetaminophen (TYLENOL) 325 MG tablet Take 650 mg by mouth every 6 (six) hours as needed.     alendronate (FOSAMAX) 70 MG tablet TAKE 1 TABLET(70 MG) BY MOUTH EVERY 7 DAYS WITH A FULL GLASS OF WATER AND ON AN EMPTY STOMACH 4 tablet 11   amLODipine (NORVASC) 5 MG tablet TAKE 1 TABLET(5 MG) BY MOUTH EVERY MORNING 90 tablet 1   amoxicillin (AMOXIL) 500 MG tablet TK 4 TS PO 1 HOUR PRIOR TO APPOINTMENT  0   aspirin 81 MG tablet Take 81 mg by mouth daily.     Azelastine-Fluticasone 137-50 MCG/ACT SUSP 1 spray in each nostril     benazepril (LOTENSIN) 20 MG tablet TAKE 1 TABLET(20 MG) BY MOUTH DAILY 90 tablet 1   Biotin 40981 MCG TABS Take 1 tablet by mouth daily.     calcium carbonate (OS-CAL) 1250 (500 Ca) MG chewable tablet Chew 1 tablet by mouth daily.     cholecalciferol (VITAMIN D) 1000 units tablet Take 3,000 Units by mouth daily.     Coenzyme Q10 (CO Q-10) 100 MG CAPS Take 1 capsule by mouth daily.     Collagen-Boron-Hyaluronic Acid (MOVE FREE ULTRA JOINT HEALTH PO) Take 1 tablet by mouth daily.     desloratadine (CLARINEX) 5 MG tablet Take 5 mg by mouth daily.     latanoprost (XALATAN) 0.005 % ophthalmic solution SMARTSIG:1 Drop(s) In Eye(s) Every Evening     montelukast (SINGULAIR) 10 MG tablet Take 10 mg by mouth at bedtime.     Multiple Vitamins-Minerals (CENTRUM SILVER 50+WOMEN) TABS Take 1 tablet by mouth daily.     Multiple Vitamins-Minerals (PRESERVISION AREDS 2  PO) Take 1 capsule by mouth in the morning and at bedtime.     Probiotic Product (PROBIOTIC DAILY PO) Take 1 capsule by mouth daily.     rosuvastatin (CRESTOR) 20 MG tablet TAKE 1 TABLET(20 MG) BY MOUTH DAILY 90 tablet 3   sertraline (ZOLOFT) 100 MG tablet TAKE 1 TABLET(100 MG) BY MOUTH DAILY 90 tablet 1   triamcinolone ointment (KENALOG) 0.1 % apply sparingly to affected areas     No current facility-administered medications on file prior to visit.     Review of Systems  Constitutional:  Negative for fever.  HENT:  Positive for postnasal drip. Negative for congestion, sinus pressure and sinus pain.   Respiratory:  Negative for cough, shortness of breath and wheezing.   Cardiovascular:  Negative for chest pain, palpitations and leg swelling.  Musculoskeletal:  Positive for arthralgias.  Neurological:  Negative for light-headedness and headaches.       Objective:   Vitals:   12/10/23 0956  BP: 118/78  Pulse: 82  Temp: 98 F (36.7 C)  SpO2: 98%   BP Readings from Last 3 Encounters:  12/10/23 118/78  06/10/23 122/66  03/07/23 120/60   Wt Readings from Last 3 Encounters:  12/10/23 251  lb (113.9 kg)  06/10/23 257 lb 2 oz (116.6 kg)  03/07/23 258 lb 12.8 oz (117.4 kg)   Body mass index is 45.91 kg/m.    Physical Exam Constitutional:      General: She is not in acute distress.    Appearance: Normal appearance.  HENT:     Head: Normocephalic and atraumatic.  Eyes:     Conjunctiva/sclera: Conjunctivae normal.  Cardiovascular:     Rate and Rhythm: Normal rate and regular rhythm.     Heart sounds: Murmur (2/6 sys) heard.  Pulmonary:     Effort: Pulmonary effort is normal. No respiratory distress.     Breath sounds: Normal breath sounds. No wheezing.  Musculoskeletal:     Cervical back: Neck supple.     Right lower leg: No edema.     Left lower leg: No edema.  Lymphadenopathy:     Cervical: No cervical adenopathy.  Skin:    General: Skin is warm and dry.      Findings: No rash.  Neurological:     Mental Status: She is alert. Mental status is at baseline.  Psychiatric:        Mood and Affect: Mood normal.        Behavior: Behavior normal.        Lab Results  Component Value Date   WBC 6.2 06/10/2023   HGB 13.3 06/10/2023   HCT 41.5 06/10/2023   PLT 206.0 06/10/2023   GLUCOSE 121 (H) 06/10/2023   CHOL 167 06/10/2023   TRIG 288.0 (H) 06/10/2023   HDL 60.50 06/10/2023   LDLDIRECT 80.0 06/10/2023   LDLCALC 106 (H) 06/03/2020   ALT 23 06/10/2023   AST 19 06/10/2023   NA 141 06/10/2023   K 4.9 06/10/2023   CL 101 06/10/2023   CREATININE 0.57 06/10/2023   BUN 20 06/10/2023   CO2 29 06/10/2023   TSH 1.23 12/06/2020   INR 0.98 10/04/2015   HGBA1C 6.3 06/10/2023   MICROALBUR 4.4 (H) 06/10/2023     Assessment & Plan:    See Problem List for Assessment and Plan of chronic medical problems.

## 2023-12-10 ENCOUNTER — Ambulatory Visit: Payer: Medicare Other | Admitting: Internal Medicine

## 2023-12-10 VITALS — BP 118/78 | HR 82 | Temp 98.0°F | Ht 62.0 in | Wt 251.0 lb

## 2023-12-10 DIAGNOSIS — E119 Type 2 diabetes mellitus without complications: Secondary | ICD-10-CM | POA: Diagnosis not present

## 2023-12-10 DIAGNOSIS — E7849 Other hyperlipidemia: Secondary | ICD-10-CM

## 2023-12-10 DIAGNOSIS — F3289 Other specified depressive episodes: Secondary | ICD-10-CM

## 2023-12-10 DIAGNOSIS — M810 Age-related osteoporosis without current pathological fracture: Secondary | ICD-10-CM | POA: Diagnosis not present

## 2023-12-10 DIAGNOSIS — I10 Essential (primary) hypertension: Secondary | ICD-10-CM

## 2023-12-10 DIAGNOSIS — Z23 Encounter for immunization: Secondary | ICD-10-CM

## 2023-12-10 LAB — COMPREHENSIVE METABOLIC PANEL
ALT: 38 U/L — ABNORMAL HIGH (ref 0–35)
AST: 31 U/L (ref 0–37)
Albumin: 4.7 g/dL (ref 3.5–5.2)
Alkaline Phosphatase: 86 U/L (ref 39–117)
BUN: 18 mg/dL (ref 6–23)
CO2: 28 meq/L (ref 19–32)
Calcium: 9.7 mg/dL (ref 8.4–10.5)
Chloride: 100 meq/L (ref 96–112)
Creatinine, Ser: 0.53 mg/dL (ref 0.40–1.20)
GFR: 92.78 mL/min (ref >60.00)
Glucose, Bld: 122 mg/dL — ABNORMAL HIGH (ref 70–99)
Potassium: 4.2 meq/L (ref 3.5–5.1)
Sodium: 139 meq/L (ref 135–145)
Total Bilirubin: 1.2 mg/dL (ref 0.2–1.2)
Total Protein: 7.8 g/dL (ref 6.0–8.3)

## 2023-12-10 LAB — LIPID PANEL
Cholesterol: 174 mg/dL (ref 0–200)
HDL: 63.3 mg/dL (ref >39.00)
LDL Cholesterol: 62 mg/dL (ref 0–99)
NonHDL: 110.45
Total CHOL/HDL Ratio: 3
Triglycerides: 243 mg/dL — ABNORMAL HIGH (ref 0.0–149.0)
VLDL: 48.6 mg/dL — ABNORMAL HIGH (ref 0.0–40.0)

## 2023-12-10 LAB — HEMOGLOBIN A1C: Hgb A1c MFr Bld: 6.4 % (ref 4.6–6.5)

## 2023-12-10 NOTE — Assessment & Plan Note (Signed)
Chronic Controlled, stable Continue  sertraline 100 mg daily 

## 2023-12-10 NOTE — Assessment & Plan Note (Signed)
Chronic Dexa up to date Continue Fosamax 70 mg weekly-started 05/2021-plan to continue for 5 years Continue taking calcium and vitamin d daily Encouraged regular exercise Check vitamin D level

## 2023-12-10 NOTE — Assessment & Plan Note (Signed)
Chronic Blood pressure well controlled CMP Continue amlodipine 5 mg daily, benazepril 20 mg daily 

## 2023-12-10 NOTE — Assessment & Plan Note (Addendum)
Chronic  Lab Results  Component Value Date   HGBA1C 6.3 06/10/2023   Sugars well controlled - does not need medication but could consider medication that may help keep sugars controlled and aid in weight loss - discussed some options Check A1c, urine microalbumin Continue lifestyle control Stressed regular exercise, diabetic diet

## 2023-12-10 NOTE — Assessment & Plan Note (Signed)
Chronic Check lipid panel  Continue crestor 20 mg daily Regular exercise and healthy diet encouraged  

## 2023-12-18 ENCOUNTER — Encounter: Payer: Self-pay | Admitting: Internal Medicine

## 2023-12-18 LAB — HM DIABETES EYE EXAM

## 2024-02-11 ENCOUNTER — Other Ambulatory Visit: Payer: Self-pay | Admitting: Internal Medicine

## 2024-03-10 ENCOUNTER — Ambulatory Visit (INDEPENDENT_AMBULATORY_CARE_PROVIDER_SITE_OTHER): Payer: Medicare Other

## 2024-03-10 VITALS — BP 118/62 | HR 62 | Ht 63.0 in | Wt 246.8 lb

## 2024-03-10 DIAGNOSIS — Z800 Family history of malignant neoplasm of digestive organs: Secondary | ICD-10-CM

## 2024-03-10 DIAGNOSIS — Z860100 Personal history of colon polyps, unspecified: Secondary | ICD-10-CM

## 2024-03-10 DIAGNOSIS — Z1211 Encounter for screening for malignant neoplasm of colon: Secondary | ICD-10-CM

## 2024-03-10 DIAGNOSIS — Z0000 Encounter for general adult medical examination without abnormal findings: Secondary | ICD-10-CM

## 2024-03-10 NOTE — Progress Notes (Signed)
 Subjective:   Jane Montgomery is a 74 y.o. who presents for a Medicare Wellness preventive visit.  Visit Complete: In person  AWV Questionnaire: No: Patient Medicare AWV questionnaire was not completed prior to this visit.  Cardiac Risk Factors include: advanced age (>68men, >23 women);diabetes mellitus;dyslipidemia;hypertension;obesity (BMI >30kg/m2)     Objective:    Today's Vitals   03/10/24 1128  BP: 118/62  Pulse: 62  SpO2: 97%  Weight: 246 lb 12.8 oz (111.9 kg)  Height: 5\' 3"  (1.6 m)   Body mass index is 43.72 kg/m.     03/10/2024   11:24 AM 03/07/2023   11:49 AM 03/01/2022   12:17 PM 02/28/2021   11:21 AM 06/04/2019    9:04 AM 07/14/2018    7:41 AM 04/10/2018    6:08 PM  Advanced Directives  Does Patient Have a Medical Advance Directive? Yes Yes Yes Yes No No No  Type of Estate agent of [place];Living will Healthcare Power of [place];Living will Living will;Healthcare Power of [employer] Power of [place];Living will     Does patient want to make changes to medical advance directive? No - Patient declined No - Patient declined  No - Patient declined   Yes (ED - Information included in AVS)  Copy of Healthcare Power of Attorney in Chart? Yes - validated most recent copy scanned in chart (See row information) Yes - validated most recent copy scanned in chart (See row information) Yes - validated most recent copy scanned in chart (See row information) No - copy requested     Would patient like information on creating a medical advance directive?     No - Patient declined No - Patient declined     Current Medications (verified) Outpatient Encounter Medications as of 03/10/2024  Medication Sig   acetaminophen (TYLENOL) 325 MG tablet Take 650 mg by mouth every 6 (six) hours as needed.   alendronate (FOSAMAX) 70 MG tablet TAKE 1 TABLET(70 MG) BY MOUTH EVERY 7 DAYS WITH A FULL GLASS OF WATER AND ON AN EMPTY STOMACH   amLODipine (NORVASC) 5 MG tablet  TAKE 1 TABLET(5 MG) BY MOUTH EVERY MORNING   amoxicillin (AMOXIL) 500 MG tablet TK 4 TS PO 1 HOUR PRIOR TO APPOINTMENT   aspirin 81 MG tablet Take 81 mg by mouth daily.   Azelastine-Fluticasone 137-50 MCG/ACT SUSP 1 spray in each nostril   benazepril (LOTENSIN) 20 MG tablet TAKE 1 TABLET(20 MG) BY MOUTH DAILY   Biotin 16109 MCG TABS Take 1 tablet by mouth daily.   calcium carbonate (OS-CAL) 1250 (500 Ca) MG chewable tablet Chew 1 tablet by mouth daily.   cholecalciferol (VITAMIN D) 1000 units tablet Take 3,000 Units by mouth daily.   Coenzyme Q10 (CO Q-10) 100 MG CAPS Take 1 capsule by mouth daily.   Collagen-Boron-Hyaluronic Acid (MOVE FREE ULTRA JOINT HEALTH PO) Take 1 tablet by mouth daily.   desloratadine (CLARINEX) 5 MG tablet Take 5 mg by mouth daily.   latanoprost (XALATAN) 0.005 % ophthalmic solution SMARTSIG:1 Drop(s) In Eye(s) Every Evening   montelukast (SINGULAIR) 10 MG tablet Take 10 mg by mouth at bedtime.   Multiple Vitamins-Minerals (CENTRUM SILVER 50+WOMEN) TABS Take 1 tablet by mouth daily.   Multiple Vitamins-Minerals (PRESERVISION AREDS 2 PO) Take 1 capsule by mouth in the morning and at bedtime.   Probiotic Product (PROBIOTIC DAILY PO) Take 1 capsule by mouth daily.   rosuvastatin (CRESTOR) 20 MG tablet TAKE 1 TABLET(20 MG) BY MOUTH DAILY  sertraline (ZOLOFT) 100 MG tablet TAKE 1 TABLET(100 MG) BY MOUTH DAILY   triamcinolone ointment (KENALOG) 0.1 % apply sparingly to affected areas   No facility-administered encounter medications on file as of 03/10/2024.    Allergies (verified) Patient has no known allergies.   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   Sleep apnea    has cpap   Tremor, essential    Dr Sandria Manly   Past Surgical History:  Procedure Laterality Date   CATARACT EXTRACTION Bilateral 12/31/2013   CESAREAN SECTION     x 2   COLONOSCOPY  [phone]   Negative, redundant colon; [facility]   DILATION AND CURETTAGE OF UTERUS     Dr Rosalio Macadamia   EYE SURGERY     PLANTAR FASCIA SURGERY Bilateral    TONSILLECTOMY     TOTAL SHOULDER ARTHROPLASTY Right 10/13/2015   Procedure: TOTAL SHOULDER ARTHROPLASTY;  Surgeon: Jones Broom, MD;  Location: [facility] OR;  Service: Orthopedics;  Laterality: Right;  Right total shoulder arthroplasty   Family History  Problem Relation Age of Onset   Hypertension Father    Heart attack Father 23       CABG    Colon cancer Mother 24   Macular degeneration Mother    Hypertension Mother    Seizures Mother        ? etiology (preceded CVA)   Stroke Mother 52   Osteoporosis Mother    Myelodysplastic syndrome Mother    Colon cancer Maternal Grandmother    Colon cancer Maternal Aunt    Colon cancer Other        2 Maternal Great Aunts   Heart attack Paternal Grandfather        > 55   Heart attack Paternal Aunt 5   Leukemia Paternal Grandmother    Diabetes Neg Hx    Colon polyps Neg Hx    Esophageal cancer Neg Hx    Rectal cancer Neg Hx    Stomach cancer Neg Hx    Social History   Socioeconomic History   Marital status: Widowed    Spouse name: Not on file   Number of children: 2   Years of education: Not on file   Highest education level: 12th grade  Occupational History   Not on file  Tobacco Use   Smoking status: Never    Passive  exposure: Never   Smokeless tobacco: Never  Vaping Use   Vaping status: Never Used  Substance and Sexual Activity   Alcohol use: No   Drug use: No   Sexual activity: Yes    Birth control/protection: Post-menopausal  Other Topics Concern   Not on file  Social History Narrative   Not on file   Social Drivers of Health   Financial Resource Strain: Low Risk  (03/10/2024)   Overall Financial Resource Strain (CARDIA)    Difficulty of Paying Living Expenses: Not hard at all  Food Insecurity: No Food Insecurity (03/10/2024)   Hunger Vital Sign    Worried About Running Out of Food in the Last Year: Never true    Ran Out of Food in the Last Year: Never true  Transportation Needs: No Transportation Needs (03/10/2024)   PRAPARE - Administrator, Civil Service (Medical): No    Lack of Transportation (Non-Medical): No  Physical Activity: Insufficiently Active (03/10/2024)   Exercise Vital Sign    Days of Exercise per Week: 3 days    Minutes of Exercise per Session: 10 min  Stress: No Stress Concern Present (03/10/2024)   [employer] of Occupational Health - Occupational Stress Questionnaire    Feeling of Stress : Not at all  Social Connections: Socially Isolated (03/10/2024)   Social Connection and Isolation Panel [NHANES]    Frequency of Communication with Friends and Family: More than three times a week    Frequency of Social Gatherings with Friends and Family: More than three times a week    Attends Religious Services: Never    Database administrator or Organizations: No    Attends Banker Meetings: Never    Marital Status: Widowed    Tobacco Counseling - Non Smoker Counseling given - N/A  Clinical Intake:  Pre-visit preparation completed: Yes  Pain : No/denies pain     BMI - recorded: 43.72 Nutritional Risks: None Diabetes: No  How often do you need to have someone help you when you read instructions, pamphlets, or other written materials from  your doctor or pharmacy?: 1 - Never  Interpreter Needed?: No  Information entered by :: Hassell Halim, CMA   Activities of Daily Living     03/10/2024   11:32 AM  In your present state of health, do you have any difficulty performing the following activities:  Hearing? 0  Vision? 0  Difficulty concentrating or making decisions? 0  Walking or climbing stairs? 0  Dressing or bathing? 0  Doing errands, shopping? 0  Preparing Food and eating ? N  Using the Toilet? N  In the past six months, have you accidently leaked urine? Y  Comment wears pad occasionally - no Urology referral needed  Do you have problems with loss of bowel control? N  Managing your Medications? N  Managing your Finances? N  Housekeeping or managing your Housekeeping? N    Patient Care Team: Pincus Sanes, MD as PCP - General (Internal Medicine) Mateo Flow, MD as Consulting Physician (Ophthalmology) Szabat, Vinnie Level, [facility] (Inactive) as Pharmacist (Pharmacist) Rennis Chris, MD as Consulting Physician (Ophthalmology)  Indicate any recent Medical Services you may have received from other than [facility] providers in the past year (date may be approximate).     Assessment:   This is a routine wellness examination for Cara.  Hearing/Vision screen Hearing Screening - Comments:: Denies hearing difficulties   Vision Screening - Comments:: Wears rx glasses - up to date with routine eye exams with Dr Mateo Flow   Goals Addressed               This Visit's Progress     Patient Stated (pt-stated)  Patient stated she plans to stay active.       Depression Screen     03/10/2024   11:38 AM 12/10/2023   10:07 AM 06/10/2023    9:54 AM 03/07/2023   11:47 AM 12/05/2022   10:03 AM 06/06/2022   10:00 AM 03/01/2022   12:14 PM  PHQ 2/9 Scores  PHQ - 2 Score 0 0 0 0 0 0 0  PHQ- 9 Score 0 0  1 1 2      Fall Risk     03/10/2024   11:33 AM 12/10/2023   10:07 AM 06/10/2023    9:54 AM 03/07/2023   11:50 AM  12/05/2022   10:03 AM  Fall Risk   Falls in the past year? 0 0 0 0 0  Number falls in past yr: 0 0 0 0 0  Injury with Fall? 0 0 0 0 0  Risk for fall due to : No Fall Risks No Fall Risks No Fall Risks No Fall Risks No Fall Risks  Follow up Falls prevention discussed;Falls evaluation completed Falls evaluation completed Falls evaluation completed Falls prevention discussed Falls evaluation completed    MEDICARE RISK AT HOME:  Medicare Risk at Home Any stairs in or around the home?: Yes (3 steps) If so, are there any without handrails?: Yes (3 steps - holds onto equipment nearby) Home free of loose throw rugs in walkways, pet beds, electrical cords, etc?: Yes Adequate lighting in your home to reduce risk of falls?: Yes Life alert?: No Use of a cane, walker or w/c?: Yes (cane) Grab bars in the bathroom?: Yes Shower chair or bench in shower?: Yes Elevated toilet seat or a handicapped toilet?: Yes  TIMED UP AND GO:  Was the test performed?  No  Cognitive Function: 6CIT completed        03/10/2024   11:35 AM 03/07/2023   11:51 AM 03/01/2022   12:21 PM  6CIT Screen  What Year? 0 points 0 points 0 points  What month? 0 points 0 points 0 points  What time? 0 points 0 points 0 points  Count back from 20 0 points 0 points 0 points  Months in reverse 0 points 0 points 0 points  Repeat phrase 0 points 0 points 0 points  Total Score 0 points 0 points 0 points    Immunizations Immunization History  Administered Date(s) Administered   Fluad Quad(high Dose 65+) 12/06/2020, 12/05/2022   Fluad Trivalent(High Dose 65+) 12/10/2023   Influenza Split 01/23/2012, 09/08/2014   Influenza Whole 10/02/2008   Influenza, High Dose Seasonal PF 09/26/2017, 10/06/2018, 11/09/2019   Influenza-Unspecified 10/14/2013, 10/01/2015, 09/20/2016, 09/26/2017, 10/26/2021   Pneumococcal Conjugate-13 09/17/2012   Pneumococcal Polysaccharide-23 04/03/2017   Td 06/10/2023   Tdap 08/13/2012   Zoster  Recombinant(Shingrix) 08/29/2021, 10/24/2021   Zoster, Live 10/14/2013    Screening Tests Health Maintenance  Topic Date Due   FOOT EXAM  10/07/2019   Colonoscopy  07/15/2023   Diabetic kidney evaluation - Urine ACR  06/09/2024   HEMOGLOBIN A1C  06/09/2024   Diabetic kidney evaluation - eGFR measurement  12/09/2024   OPHTHALMOLOGY EXAM  12/17/2024   Medicare Annual Wellness (AWV)  03/10/2025   DEXA SCAN  07/16/2025   MAMMOGRAM  10/18/2025   DTaP/Tdap/Td (3 - Td or Tdap) 06/09/2033   Pneumonia Vaccine 37+ Years old  Completed   INFLUENZA VACCINE  Completed   Hepatitis C Screening  Completed   Zoster Vaccines- Shingrix  Completed   HPV VACCINES  Aged Out  COVID-19 Vaccine  Discontinued    Health Maintenance  Health Maintenance Due  Topic Date Due   FOOT EXAM  10/07/2019   Colonoscopy  07/15/2023   Health Maintenance Items Addressed: 03/10/2024 Referral sent to GI for colonoscopy today  Foot Exam status - Due by PCP in 05/2024.  Additional Screening:  Vision Screening: Recommended annual ophthalmology exams for early detection of glaucoma and other disorders of the eye. Pt stated she'd seen Dr Benjamine Mola of [facility] in 12/18/2023 and next appt is 06/01/2024.  Dental Screening: Recommended annual dental exams for proper oral hygiene  Community Resource Referral / Chronic Care Management: CRR required this visit?  No   CCM required this visit?  No     Plan:     I have personally reviewed and noted the following in the patient's chart:   Medical and social history Use of alcohol, tobacco or illicit drugs  Current medications and supplements including opioid prescriptions. Patient is not currently taking opioid prescriptions. Functional ability and status Nutritional status Physical activity Advanced directives List of other physicians Hospitalizations, surgeries, and ER visits in previous 12 months Vitals Screenings to include cognitive, depression, and  falls Referrals and appointments  In addition, I have reviewed and discussed with patient certain preventive protocols, quality metrics, and best practice recommendations. A written personalized care plan for preventive services as well as general preventive health recommendations were provided to patient.     Darreld Mclean, CMA   03/10/2024   After Visit Summary: (MyChart) Due to this being a telephonic visit, the after visit summary with patients personalized plan was offered to patient via MyChart   Notes: Please refer to Routing Comments.

## 2024-03-10 NOTE — Patient Instructions (Addendum)
 Ms. Steinmiller , Thank you for taking time to come for your Medicare Wellness Visit. I appreciate your ongoing commitment to your health goals. Please review the following plan we discussed and let me know if I can assist you in the future.   Referrals/Orders/Follow-Ups/Clinician Recommendations: Aim for 30 minutes of exercise or brisk walking, 6-8 glasses of water, and 5 servings of fruits and vegetables each day. Referral for repeat Colonoscopy.    This is a list of the screening recommended for you and due dates:  Health Maintenance  Topic Date Due   Complete foot exam   10/07/2019   Colon Cancer Screening  07/15/2023   Yearly kidney health urinalysis for diabetes  06/09/2024   Hemoglobin A1C  06/09/2024   Yearly kidney function blood test for diabetes  12/09/2024   Eye exam for diabetics  12/17/2024   Medicare Annual Wellness Visit  03/10/2025   DEXA scan (bone density measurement)  07/16/2025   Mammogram  10/18/2025   DTaP/Tdap/Td vaccine (3 - Td or Tdap) 06/09/2033   Pneumonia Vaccine  Completed   Flu Shot  Completed   Hepatitis C Screening  Completed   Zoster (Shingles) Vaccine  Completed   HPV Vaccine  Aged Out   COVID-19 Vaccine  Discontinued    Advanced directives: (In Chart) A copy of your advanced directives are scanned into your chart should your provider ever need it.  Next Medicare Annual Wellness Visit scheduled for next year: Yes

## 2024-05-07 NOTE — Progress Notes (Signed)
 [facility] - Clinic Note  05/08/2024     CHIEF COMPLAINT Patient presents for Retina Follow Up    HISTORY OF PRESENT ILLNESS: Jane Montgomery is a 74 y.o. female who presents to the clinic today for:   HPI     Retina Follow Up   Patient presents with  Dry AMD.  In both eyes.  This started 6 months ago.  Duration of months.  Since onset it is gradually worsening.        Comments   6 month retina follow up ARMD OU pt is reporting vision has got worse she saw Dr Carloyn Chi 2-3 months ago she was told the spot had got little worse she denies any flashes or floaters       Last edited by Alise Appl, COT on 05/08/2024  9:41 AM.     Patient feels the vision in the right eye is worse.  Referring physician: Colene Dauphin, MD [address] [place],  [place] [address]  HISTORICAL INFORMATION:   Selected notes from the MEDICAL RECORD NUMBER Referred by Dr. Reyne Cave for eval of GA paracentral mac   CURRENT MEDICATIONS: Current Outpatient Medications (Ophthalmic Drugs)  Medication Sig   latanoprost (XALATAN) 0.005 % ophthalmic solution SMARTSIG:1 Drop(s) In Eye(s) Every Evening   No current facility-administered medications for this visit. (Ophthalmic Drugs)   Current Outpatient Medications (Other)  Medication Sig   acetaminophen  (TYLENOL ) 325 MG tablet Take 650 mg by mouth every 6 (six) hours as needed.   alendronate  (FOSAMAX ) 70 MG tablet TAKE 1 TABLET(70 MG) BY MOUTH EVERY 7 DAYS WITH A FULL GLASS OF WATER AND ON AN EMPTY STOMACH   amLODipine  (NORVASC ) 5 MG tablet TAKE 1 TABLET(5 MG) BY MOUTH EVERY MORNING   amoxicillin (AMOXIL) 500 MG tablet TK 4 TS PO 1 HOUR PRIOR TO APPOINTMENT   aspirin  81 MG tablet Take 81 mg by mouth daily.   Azelastine -Fluticasone  137-50 MCG/ACT SUSP 1 spray in each nostril   benazepril  (LOTENSIN ) 20 MG tablet TAKE 1 TABLET(20 MG) BY MOUTH DAILY   Biotin 47425 MCG TABS Take 1 tablet by mouth daily.   calcium  carbonate (OS-CAL)  1250 (500 Ca) MG chewable tablet Chew 1 tablet by mouth daily.   cholecalciferol (VITAMIN D ) 1000 units tablet Take 3,000 Units by mouth daily.   Coenzyme Q10 (CO Q-10) 100 MG CAPS Take 1 capsule by mouth daily.   Collagen-Boron-Hyaluronic Acid (MOVE FREE ULTRA JOINT HEALTH PO) Take 1 tablet by mouth daily.   desloratadine (CLARINEX) 5 MG tablet Take 5 mg by mouth daily.   montelukast (SINGULAIR) 10 MG tablet Take 10 mg by mouth at bedtime.   Multiple Vitamins-Minerals (CENTRUM SILVER 50+WOMEN) TABS Take 1 tablet by mouth daily.   Multiple Vitamins-Minerals (PRESERVISION AREDS 2 PO) Take 1 capsule by mouth in the morning and at bedtime.   Probiotic Product (PROBIOTIC DAILY PO) Take 1 capsule by mouth daily.   rosuvastatin  (CRESTOR ) 20 MG tablet TAKE 1 TABLET(20 MG) BY MOUTH DAILY   sertraline  (ZOLOFT ) 100 MG tablet TAKE 1 TABLET(100 MG) BY MOUTH DAILY   triamcinolone ointment (KENALOG) 0.1 % apply sparingly to affected areas   No current facility-administered medications for this visit. (Other)   REVIEW OF SYSTEMS: ROS   Positive for: Gastrointestinal, Neurological, Musculoskeletal, Endocrine, Cardiovascular, Eyes, Respiratory Negative for: Constitutional, Skin, Genitourinary, HENT, Psychiatric, Allergic/Imm, Heme/Lymph Last edited by Alise Appl, COT on 05/08/2024  9:41 AM.      Jane Montgomery  No Known Allergies  PAST MEDICAL HISTORY Past Medical History:  Diagnosis Date   Allergy    Anisocoria    evaluated by Dr Dania Dupre 2005   Arthritis    Benign positional vertigo    in LLDP   Chronic back pain    reason unknown    Depression    takes Zoloft  daily- hormone imbalance - not truly depression per pt    Diarrhea    Diverticulosis    Gestational diabetes 1982, 1984   "pre"   Gilbert syndrome    Heart murmur    Hyperlipidemia    takes Pravastatin  daily   Hypertension    takes Amlodipine  daily as well as Benazepril    Hypertensive retinopathy    Joint pain    Macular  degeneration    early stages and dry   Nocturia    Osteoporosis    Shortness of breath dyspnea    allergy related and occasionally will take Claritin .   Sleep apnea    has cpap   Tremor, essential    Dr Dania Dupre   Past Surgical History:  Procedure Laterality Date   CATARACT EXTRACTION Bilateral 12/31/2013   CESAREAN SECTION     x 2   COLONOSCOPY  [phone]   Negative, redundant colon; [facility]   DILATION AND CURETTAGE OF UTERUS     Dr Clementina Cutter   EYE SURGERY     PLANTAR FASCIA SURGERY Bilateral    TONSILLECTOMY     TOTAL SHOULDER ARTHROPLASTY Right 10/13/2015   Procedure: TOTAL SHOULDER ARTHROPLASTY;  Surgeon: Sammye Cristal, MD;  Location: [facility] OR;  Service: Orthopedics;  Laterality: Right;  Right total shoulder arthroplasty    FAMILY HISTORY Family History  Problem Relation Age of Onset   Hypertension Father    Heart attack Father 9       CABG    Colon cancer Mother 10   Macular degeneration Mother    Hypertension Mother    Seizures Mother        ? etiology (preceded CVA)   Stroke Mother 6   Osteoporosis Mother    Myelodysplastic syndrome Mother    Colon cancer Maternal Grandmother    Colon cancer Maternal Aunt    Colon cancer Other        2 Maternal Great Aunts   Heart attack Paternal Grandfather        > 55   Heart attack Paternal Aunt 69   Leukemia Paternal Grandmother    Diabetes Neg Hx    Colon polyps Neg Hx    Esophageal cancer Neg Hx    Rectal cancer Neg Hx    Stomach cancer Neg Hx    SOCIAL HISTORY Social History   Tobacco Use   Smoking status: Never    Passive exposure: Never   Smokeless tobacco: Never  Vaping Use   Vaping status: Never Used  Substance Use Topics   Alcohol use: No   Drug use: No       OPHTHALMIC EXAM: Base Eye Exam     Visual Acuity (Snellen - Linear)       Right Left   Dist cc 20/40 20/40   Dist ph cc NI NI         Tonometry (Tonopen, 9:46 AM)       Right Left   Pressure 13 15         Pupils        Pupils Dark Light Shape React APD   Right PERRL 3 2  Round Brisk None   Left PERRL 3 2 Round Brisk None         Visual Fields       Left Right    Full Full         Extraocular Movement       Right Left    Full, Ortho Full, Ortho         Neuro/Psych     Oriented x3: Yes   Mood/Affect: Normal         Dilation     Both eyes: 2.5% Phenylephrine  @ 9:46 AM           Slit Lamp and Fundus Exam     Slit Lamp Exam       Right Left   Lids/Lashes Dermatochalasis - upper lid, Meibomian gland dysfunction Dermatochalasis - upper lid   Conjunctiva/Sclera White and quiet White and quiet   Cornea arcus, well healed cataract wounds arcus, well healed cataract wounds, trace PEE   Anterior Chamber Deep and quiet Deep and quiet   Iris Round and moderately dilated Round and dilated, +PPM   Lens PC IOL in good position with open PC PC IOL in good position   Anterior Vitreous Vitreous syneresis, Posterior vitreous detachment, mild vitreous condensations Vitreous syneresis, Posterior vitreous detachment, vitreous condensations         Fundus Exam       Right Left   Disc mild Pallor, Sharp rim, mild PPA mild Pallor, Sharp rim, mild PPA   C/D Ratio 0.5 0.6   Macula Flat, Blunted foveal reflex, RPE mottling and clumping, Drusen, focal round area of atrophy IN to fovea -- slightly increased, No heme or edema Flat, Blunted foveal reflex, RPE mottling and clumping, diffuse early atrophy, focal GA SN macula -- slightly increased, No heme or edema   Vessels attenuated, mild tortuosity attenuated, mild tortuosity   Periphery Attached, scattered pavingstone degeneration, mild reticular degeneration, No heme Attached, scattered pavingstone degeneration, mild reticular degeneration, No heme           Refraction     Wearing Rx       Sphere Cylinder Axis Add   Right -2.50 +0.75 003 +2.50   Left -0.50 +0.50 163 +2.50           IMAGING AND PROCEDURES  Imaging and  Procedures for 05/08/2024          ASSESSMENT/PLAN:    ICD-10-CM   1. Intermediate stage nonexudative age-related macular degeneration of both eyes  H35.3132 OCT, Retina - OU - Both Eyes    2. 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 Essential hypertension  I10     4. Hypertensive retinopathy of both eyes  H35.033     5. Pseudophakia, both eyes  Z96.1       1,2. 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 Hypertensive retinopathy OU - discussed importance of tight BP control - continue to monitor  5. Pseudophakia OU  - s/p CE/IOL OU  - IOsL in good position, doing well  - continue to monitor   Ophthalmic Meds Ordered this visit:  No orders of the defined types were placed in this encounter.    No follow-ups on file.  There are no Patient Instructions on file for this visit.  This document serves as a record of services personally performed by Jeanice Millard, MD, PhD. It was created on their behalf by Eller Gut COT, an ophthalmic technician. The creation of this record is the provider's dictation and/or activities during the visit.     Electronically signed by: Eller Gut COT 05.08.2025 10:55 AM  This document serves as a record of services personally performed by Jeanice Millard, MD, PhD. It was created on their behalf by Olene Berne, COT an ophthalmic technician. The creation of this record is the provider's dictation and/or activities during the visit.    Electronically signed by:  Olene Berne, COT  05/08/24 10:56 AM    Abbreviations: M myopia (nearsighted); A astigmatism; H hyperopia (farsighted); P presbyopia; Mrx spectacle prescription;  CTL contact lenses; OD right eye; OS left eye; OU both eyes  XT exotropia; ET esotropia; PEK punctate epithelial keratitis; PEE punctate epithelial erosions; DES dry eye syndrome; MGD meibomian gland dysfunction; ATs artificial tears; PFAT's preservative free artificial tears; NSC nuclear sclerotic cataract; PSC posterior subcapsular cataract; ERM epi-retinal membrane; PVD posterior vitreous detachment; RD retinal detachment; DM diabetes mellitus; DR diabetic retinopathy; NPDR non-proliferative diabetic retinopathy; PDR proliferative diabetic retinopathy; CSME clinically significant macular edema; DME diabetic macular edema; dbh dot blot hemorrhages; CWS cotton wool spot; POAG primary open angle glaucoma; C/D cup-to-disc ratio; HVF humphrey visual field; GVF goldmann visual field; OCT optical coherence tomography; IOP intraocular pressure; BRVO Branch retinal vein occlusion; CRVO central retinal vein occlusion; CRAO central retinal artery occlusion; BRAO branch retinal artery occlusion; RT retinal tear; SB scleral buckle; PPV pars plana vitrectomy; VH Vitreous hemorrhage; PRP panretinal laser photocoagulation; IVK intravitreal kenalog; VMT vitreomacular traction; MH Macular hole;  NVD neovascularization of the disc; NVE neovascularization elsewhere; AREDS age related eye disease study; ARMD age related macular degeneration; POAG primary open angle glaucoma; EBMD  epithelial/anterior basement membrane dystrophy; ACIOL anterior chamber intraocular lens; IOL intraocular lens; PCIOL posterior chamber intraocular lens; Phaco/IOL phacoemulsification with intraocular lens placement; PRK photorefractive keratectomy; LASIK laser assisted in situ keratomileusis; HTN hypertension; DM diabetes mellitus; COPD chronic obstructive pulmonary disease

## 2024-05-08 ENCOUNTER — Encounter (INDEPENDENT_AMBULATORY_CARE_PROVIDER_SITE_OTHER): Payer: Self-pay | Admitting: Ophthalmology

## 2024-05-08 ENCOUNTER — Encounter: Payer: Self-pay | Admitting: Pediatrics

## 2024-05-08 ENCOUNTER — Ambulatory Visit: Admitting: *Deleted

## 2024-05-08 ENCOUNTER — Ambulatory Visit (INDEPENDENT_AMBULATORY_CARE_PROVIDER_SITE_OTHER): Payer: Medicare Other | Admitting: Ophthalmology

## 2024-05-08 VITALS — Ht 61.0 in | Wt 240.0 lb

## 2024-05-08 DIAGNOSIS — Z860100 Personal history of colon polyps, unspecified: Secondary | ICD-10-CM

## 2024-05-08 DIAGNOSIS — I10 Essential (primary) hypertension: Secondary | ICD-10-CM

## 2024-05-08 DIAGNOSIS — H35033 Hypertensive retinopathy, bilateral: Secondary | ICD-10-CM | POA: Diagnosis not present

## 2024-05-08 DIAGNOSIS — H353133 Nonexudative age-related macular degeneration, bilateral, advanced atrophic without subfoveal involvement: Secondary | ICD-10-CM

## 2024-05-08 DIAGNOSIS — Z961 Presence of intraocular lens: Secondary | ICD-10-CM

## 2024-05-08 DIAGNOSIS — H353132 Nonexudative age-related macular degeneration, bilateral, intermediate dry stage: Secondary | ICD-10-CM

## 2024-05-08 DIAGNOSIS — Z800 Family history of malignant neoplasm of digestive organs: Secondary | ICD-10-CM

## 2024-05-08 DIAGNOSIS — H35383 Toxic maculopathy, bilateral: Secondary | ICD-10-CM

## 2024-05-08 MED ORDER — NA SULFATE-K SULFATE-MG SULF 17.5-3.13-1.6 GM/177ML PO SOLN: 1.0000 | Freq: Once | ORAL | 0 refills | Status: AC

## 2024-05-08 NOTE — Progress Notes (Signed)
 Pt's name and DOB verified at the beginning of the pre-visit wit 2 identifiers  Pt denies any difficulty with ambulating,sitting, laying down or rolling side to side  Pt has no issues moving head neck or swallowing  No egg or soy allergy known to patient   Has hx of being hard to wake  No FH of Malignant Hyperthermia  Pt is not on home 02   Pt is not on blood thinners   Pt denies issues with constipation   Pt is not on dialysis  Pt heart murmur  Pt denies any upcoming cardiac testing  Patient's chart reviewed by Rogena Class CNRA prior to pre-visit and patient appropriate for the [facility].  Pre-visit completed and red dot placed by patient's name on their procedure day (on provider's schedule).    Visit by phone  Pt states weight is 240 lb  IInstructions reviewed. Pt given  both [facility] main # and MD on call # prior to instructions.  Pt states understanding of instructions. Instructed pt to review instructions again prior to procedure and call main # given if has questions.. Pt states they will.   Instructed pt on where to find instructions on My Chart.

## 2024-05-10 ENCOUNTER — Encounter (INDEPENDENT_AMBULATORY_CARE_PROVIDER_SITE_OTHER): Payer: Self-pay | Admitting: Ophthalmology

## 2024-05-20 NOTE — Progress Notes (Signed)
 [facility] History and Physical   Primary Care Physician:  Colene Dauphin, MD   Reason for Procedure:  Colorectal cancer screening  Plan:    Screening colonoscopy     HPI: Jane Montgomery is a 74 y.o. female undergoing colonoscopy for colorectal cancer screening.  The patient's last colonoscopy was in 2019 and showed a 3 mm hyperplastic polyp.  Colonoscopies in 2005 and 2011 normal.  There is a pertinent family history of colorectal cancer in the patient's mother and other maternal relatives.  Patient denies change in bowel habits or rectal bleeding at the time of this procedure.   Past Medical History:  Diagnosis Date   Allergy    Anisocoria    evaluated by Dr Dania Dupre 2005   Arthritis    Benign positional vertigo    in LLDP   Chronic back pain    reason unknown    Depression    takes Zoloft  daily- hormone imbalance - not truly depression per pt    Diarrhea    Diverticulosis    Gestational diabetes 1982, 1984   "pre"   Oletta Berry syndrome    Heart murmur    Hyperlipidemia    takes Pravastatin  daily   Hypertension    takes Amlodipine  daily as well as Benazepril    Hypertensive retinopathy    Joint pain    Macular degeneration    early stages and dry   Nocturia    Osteoporosis    Shortness of breath dyspnea    allergy related and occasionally will take Claritin .   Sleep apnea    has cpap    Past Surgical History:  Procedure Laterality Date   CATARACT EXTRACTION Bilateral 12/31/2013   CESAREAN SECTION     x 2   COLONOSCOPY  [phone]   Negative, redundant colon; [facility]   DILATION AND CURETTAGE OF UTERUS     Dr Clementina Cutter   EYE SURGERY     PLANTAR FASCIA SURGERY Bilateral    TONSILLECTOMY     TOTAL SHOULDER ARTHROPLASTY Right 10/13/2015   Procedure: TOTAL SHOULDER ARTHROPLASTY;  Surgeon: Sammye Cristal, MD;  Location: [facility] OR;  Service: Orthopedics;  Laterality: Right;  Right total shoulder arthroplasty    Prior to Admission medications    Medication Sig Start Date End Date Taking? Authorizing Provider  acetaminophen  (TYLENOL ) 325 MG tablet Take 650 mg by mouth every 6 (six) hours as needed.    [provider]  alendronate  (FOSAMAX ) 70 MG tablet TAKE 1 TABLET(70 MG) BY MOUTH EVERY 7 DAYS WITH A FULL GLASS OF WATER AND ON AN EMPTY STOMACH 09/19/23   Burns, Beckey Bourgeois, MD  amLODipine  (NORVASC ) 5 MG tablet TAKE 1 TABLET(5 MG) BY MOUTH EVERY MORNING 02/11/24   Burns, Beckey Bourgeois, MD  amoxicillin (AMOXIL) 500 MG tablet TK 4 TS PO 1 HOUR PRIOR TO APPOINTMENT Patient not taking: Reported on 05/08/2024 03/22/18   [provider]  aspirin  81 MG tablet Take 81 mg by mouth daily.    [provider]  Azelastine -Fluticasone  137-50 MCG/ACT SUSP 1 spray in each nostril    [provider]  benazepril  (LOTENSIN ) 20 MG tablet TAKE 1 TABLET(20 MG) BY MOUTH DAILY 02/11/24   Colene Dauphin, MD  Biotin 81191 MCG TABS Take 1 tablet by mouth daily.    [provider]  calcium  carbonate (OS-CAL) 1250 (500 Ca) MG chewable tablet Chew 1 tablet by mouth daily. Patient not taking: Reported on 05/08/2024    [provider]  cholecalciferol (  VITAMIN D ) 1000 units tablet Take 3,000 Units by mouth daily.    [provider]  Coenzyme Q10 (CO Q-10) 100 MG CAPS Take 1 capsule by mouth daily.    [provider]  Collagen-Boron-Hyaluronic Acid (MOVE FREE ULTRA JOINT HEALTH PO) Take 1 tablet by mouth daily. Patient not taking: Reported on 05/08/2024    [provider]  desloratadine (CLARINEX) 5 MG tablet Take 5 mg by mouth daily. 05/21/22   [provider]  latanoprost (XALATAN) 0.005 % ophthalmic solution SMARTSIG:1 Drop(s) In Eye(s) Every Evening 11/22/21   [provider]  montelukast (SINGULAIR) 10 MG tablet Take 10 mg by mouth at bedtime.    [provider]  Multiple Vitamins-Minerals (CENTRUM SILVER 50+WOMEN) TABS Take 1 tablet by mouth daily.    [provider]   Multiple Vitamins-Minerals (PRESERVISION AREDS 2 PO) Take 1 capsule by mouth in the morning and at bedtime.    [provider]  Probiotic Product (PROBIOTIC DAILY PO) Take 1 capsule by mouth daily.    [provider]  rosuvastatin  (CRESTOR ) 20 MG tablet TAKE 1 TABLET(20 MG) BY MOUTH DAILY 11/07/23   Burns, Beckey Bourgeois, MD  sertraline  (ZOLOFT ) 100 MG tablet TAKE 1 TABLET(100 MG) BY MOUTH DAILY 02/11/24   Colene Dauphin, MD  triamcinolone ointment (KENALOG) 0.1 % apply sparingly to affected areas Patient not taking: Reported on 05/08/2024 05/18/21   [provider]    Current Outpatient Medications  Medication Sig Dispense Refill   acetaminophen  (TYLENOL ) 325 MG tablet Take 650 mg by mouth every 6 (six) hours as needed.     amLODipine  (NORVASC ) 5 MG tablet TAKE 1 TABLET(5 MG) BY MOUTH EVERY MORNING 90 tablet 1   aspirin  81 MG tablet Take 81 mg by mouth daily.     benazepril  (LOTENSIN ) 20 MG tablet TAKE 1 TABLET(20 MG) BY MOUTH DAILY 90 tablet 1   Biotin 10000 MCG TABS Take 1 tablet by mouth daily.     cholecalciferol (VITAMIN D ) 1000 units tablet Take 3,000 Units by mouth daily.     Coenzyme Q10 (CO Q-10) 100 MG CAPS Take 1 capsule by mouth daily.     desloratadine (CLARINEX) 5 MG tablet Take 5 mg by mouth daily.     latanoprost (XALATAN) 0.005 % ophthalmic solution SMARTSIG:1 Drop(s) In Eye(s) Every Evening     montelukast (SINGULAIR) 10 MG tablet Take 10 mg by mouth at bedtime.     Multiple Vitamins-Minerals (CENTRUM SILVER 50+WOMEN) TABS Take 1 tablet by mouth daily.     Multiple Vitamins-Minerals (PRESERVISION AREDS 2 PO) Take 1 capsule by mouth in the morning and at bedtime.     Probiotic Product (PROBIOTIC DAILY PO) Take 1 capsule by mouth daily.     rosuvastatin  (CRESTOR ) 20 MG tablet TAKE 1 TABLET(20 MG) BY MOUTH DAILY 90 tablet 3   sertraline  (ZOLOFT ) 100 MG tablet TAKE 1 TABLET(100 MG) BY MOUTH DAILY 90 tablet 1   alendronate  (FOSAMAX ) 70 MG tablet TAKE 1  TABLET(70 MG) BY MOUTH EVERY 7 DAYS WITH A FULL GLASS OF WATER AND ON AN EMPTY STOMACH 4 tablet 11   amoxicillin (AMOXIL) 500 MG tablet TK 4 TS PO 1 HOUR PRIOR TO APPOINTMENT (Patient not taking: Reported on 05/08/2024)  0   Azelastine -Fluticasone  137-50 MCG/ACT SUSP 1 spray in each nostril     Current Facility-Administered Medications  Medication Dose Route Frequency Provider Last Rate Last Admin   0.9 %  sodium chloride  infusion  500 mL Intravenous  Continuous Saranda Legrande, Scarlette Currier, MD        Allergies as of 05/22/2024   (No Known Allergies)    Family History  Problem Relation Age of Onset   Hypertension Father    Heart attack Father 67       CABG    Colon cancer Mother 23   Macular degeneration Mother    Hypertension Mother    Seizures Mother        ? etiology (preceded CVA)   Stroke Mother 79   Osteoporosis Mother    Myelodysplastic syndrome Mother    Colon cancer Maternal Grandmother    Colon cancer Maternal Aunt    Colon cancer Other        2 Maternal Great Aunts   Heart attack Paternal Grandfather        > 55   Heart attack Paternal Aunt 71   Leukemia Paternal Grandmother    Diabetes Neg Hx    Colon polyps Neg Hx    Esophageal cancer Neg Hx    Rectal cancer Neg Hx    Stomach cancer Neg Hx     Social History   Socioeconomic History   Marital status: Widowed    Spouse name: Not on file   Number of children: 2   Years of education: Not on file   Highest education level: 12th grade  Occupational History   Not on file  Tobacco Use   Smoking status: Never    Passive exposure: Never   Smokeless tobacco: Never  Vaping Use   Vaping status: Never Used  Substance and Sexual Activity   Alcohol use: No   Drug use: No   Sexual activity: Yes    Birth control/protection: Post-menopausal  Other Topics Concern   Not on file  Social History Narrative   Not on file   Social Drivers of Health   Financial Resource Strain: Low Risk  (03/10/2024)   Overall Financial  Resource Strain (CARDIA)    Difficulty of Paying Living Expenses: Not hard at all  Food Insecurity: No Food Insecurity (03/10/2024)   Hunger Vital Sign    Worried About Running Out of Food in the Last Year: Never true    Ran Out of Food in the Last Year: Never true  Transportation Needs: No Transportation Needs (03/10/2024)   PRAPARE - Administrator, Civil Service (Medical): No    Lack of Transportation (Non-Medical): No  Physical Activity: Insufficiently Active (03/10/2024)   Exercise Vital Sign    Days of Exercise per Week: 3 days    Minutes of Exercise per Session: 10 min  Stress: No Stress Concern Present (03/10/2024)   [employer] of Occupational Health - Occupational Stress Questionnaire    Feeling of Stress : Not at all  Social Connections: Socially Isolated (03/10/2024)   Social Connection and Isolation Panel [NHANES]    Frequency of Communication with Friends and Family: More than three times a week    Frequency of Social Gatherings with Friends and Family: More than three times a week    Attends Religious Services: Never    Database administrator or Organizations: No    Attends Banker Meetings: Never    Marital Status: Widowed  Intimate Partner Violence: Not At Risk (03/10/2024)   Humiliation, Afraid, Rape, and Kick questionnaire    Fear of Current or Ex-Partner: No    Emotionally Abused: No    Physically Abused: No    Sexually Abused: No  Review of Systems:  All other review of systems negative except as mentioned in the HPI.  Physical Exam: Vital signs BP 132/70   Pulse 65   Temp (!) 97.3 F (36.3 C) (Temporal)   Ht 5\' 1"  (1.549 m)   Wt 240 lb (108.9 kg)   SpO2 93%   BMI 45.35 kg/m   General:   Alert,  Well-developed, well-nourished, pleasant and cooperative in NAD Airway:  Mallampati 3 Lungs:  Clear throughout to auscultation.   Heart:  Regular rate and rhythm; no murmurs, clicks, rubs,  or gallops. Abdomen:  Soft,  nontender and nondistended. Normal bowel sounds.   Neuro/Psych:  Normal mood and affect. A and O x 3  Eugenia Hess, MD [facility]

## 2024-05-22 ENCOUNTER — Encounter: Payer: Self-pay | Admitting: Pediatrics

## 2024-05-22 ENCOUNTER — Ambulatory Visit (AMBULATORY_SURGERY_CENTER): Admitting: Pediatrics

## 2024-05-22 VITALS — BP 109/50 | HR 56 | Temp 97.3°F | Resp 10 | Ht 61.0 in | Wt 240.0 lb

## 2024-05-22 DIAGNOSIS — Z1211 Encounter for screening for malignant neoplasm of colon: Secondary | ICD-10-CM

## 2024-05-22 DIAGNOSIS — K635 Polyp of colon: Secondary | ICD-10-CM

## 2024-05-22 DIAGNOSIS — K5730 Diverticulosis of large intestine without perforation or abscess without bleeding: Secondary | ICD-10-CM

## 2024-05-22 DIAGNOSIS — Z800 Family history of malignant neoplasm of digestive organs: Secondary | ICD-10-CM

## 2024-05-22 DIAGNOSIS — Z860102 Personal history of hyperplastic colon polyps: Secondary | ICD-10-CM

## 2024-05-22 DIAGNOSIS — D123 Benign neoplasm of transverse colon: Secondary | ICD-10-CM

## 2024-05-22 MED ORDER — SODIUM CHLORIDE 0.9 % IV SOLN: 500.0000 mL | INTRAVENOUS | Status: DC

## 2024-05-22 NOTE — Patient Instructions (Signed)
-  Handout on polyps and diverticulosis provided. -await pathology results. -repeat colonoscopy for surveillance recommended in 5 years    YOU HAD AN ENDOSCOPIC PROCEDURE TODAY AT THE [facility]:   Refer to the procedure report that was given to you for any specific questions about what was found during the examination.  If the procedure report does not answer your questions, please call your gastroenterologist to clarify.  If you requested that your care partner not be given the details of your procedure findings, then the procedure report has been included in a sealed envelope for you to review at your convenience later.  YOU SHOULD EXPECT: Some feelings of bloating in the abdomen. Passage of more gas than usual.  Walking can help get rid of the air that was put into your GI tract during the procedure and reduce the bloating. If you had a lower endoscopy (such as a colonoscopy or flexible sigmoidoscopy) you may notice spotting of blood in your stool or on the toilet paper. If you underwent a bowel prep for your procedure, you may not have a normal bowel movement for a few days.  Please Note:  You might notice some irritation and congestion in your nose or some drainage.  This is from the oxygen used during your procedure.  There is no need for concern and it should clear up in a day or so.  SYMPTOMS TO REPORT IMMEDIATELY:  Following lower endoscopy (colonoscopy or flexible sigmoidoscopy):  Excessive amounts of blood in the stool  Significant tenderness or worsening of abdominal pains  Swelling of the abdomen that is new, acute  Fever of 100F or higher  For urgent or emergent issues, a gastroenterologist can be reached at any hour by calling [phone]. Do not use MyChart messaging for urgent concerns.    DIET:  We do recommend a small meal at first, but then you may proceed to your regular diet.  Drink plenty of fluids but you should avoid alcoholic beverages for 24  hours.  ACTIVITY:  You should plan to take it easy for the rest of today and you should NOT DRIVE or use heavy machinery until tomorrow (because of the sedation medicines used during the test).    FOLLOW UP: Our staff will call the number listed on your records the next business day following your procedure.  We will call around 7:15- 8:00 am to check on you and address any questions or concerns that you may have regarding the information given to you following your procedure. If we do not reach you, we will leave a message.     If any biopsies were taken you will be contacted by phone or by letter within the next 1-3 weeks.  Please call us  at [phone] if you have not heard about the biopsies in 3 weeks.    SIGNATURES/CONFIDENTIALITY: You and/or your care partner have signed paperwork which will be entered into your electronic medical record.  These signatures attest to the fact that that the information above on your After Visit Summary has been reviewed and is understood.  Full responsibility of the confidentiality of this discharge information lies with you and/or your care-partner.

## 2024-05-22 NOTE — Op Note (Signed)
 [facility] Patient Name: Jane Montgomery Procedure Date: 05/22/2024 8:48 AM MRN: [id number] Endoscopist: Eugenia Hess , MD, [id number] Age: 74 Referring MD:  Date of Birth: Jun 16, 1950 Gender: Female Account #: [id number] Procedure:                Colonoscopy Indications:              Screening in patient at increased risk: Family                            history of 1st-degree relative with colorectal                            cancer, History of colorectal cancer in additional                            maternal extended relatives, Last colonoscopy: July                            2019 Medicines:                Monitored Anesthesia Care Procedure:                Pre-Anesthesia Assessment:                           - Prior to the procedure, a History and Physical                            was performed, and patient medications and                            allergies were reviewed. The patient's tolerance of                            previous anesthesia was also reviewed. The risks                            and benefits of the procedure and the sedation                            options and risks were discussed with the patient.                            All questions were answered, and informed consent                            was obtained. Prior Anticoagulants: The patient has                            taken no anticoagulant or antiplatelet agents                            except for aspirin . ASA Grade Assessment: III - A  patient with severe systemic disease. After                            reviewing the risks and benefits, the patient was                            deemed in satisfactory condition to undergo the                            procedure.                           After obtaining informed consent, the colonoscope                            was passed under direct vision. Throughout the                            procedure,  the patient's blood pressure, pulse, and                            oxygen saturations were monitored continuously. The                            Olympus Scope [id number] was introduced through the                            anus and advanced to the cecum, identified by                            appendiceal orifice and ileocecal valve. The                            colonoscopy was performed without difficulty. The                            patient tolerated the procedure well. The quality                            of the bowel preparation was good. The ileocecal                            valve, appendiceal orifice, and rectum were                            photographed. Scope In: 8:53:02 AM Scope Out: 9:08:24 AM Scope Withdrawal Time: 0 hours 10 minutes 23 seconds  Total Procedure Duration: 0 hours 15 minutes 22 seconds  Findings:                 The perianal and digital rectal examinations were                            normal. Pertinent negatives include normal  sphincter tone and no palpable rectal lesions.                           A few small-mouthed diverticula were found in the                            sigmoid colon.                           A 4 mm polyp was found in the transverse colon. The                            polyp was sessile. The polyp was removed with a                            cold snare. Resection and retrieval were complete.                           The retroflexed view of the distal rectum and anal                            verge was normal and showed no anal or rectal                            abnormalities. Complications:            No immediate complications. Estimated blood loss:                            Minimal. Estimated Blood Loss:     Estimated blood loss was minimal. Impression:               - Diverticulosis in the sigmoid colon.                           - One 4 mm polyp in the transverse colon, removed                             with a cold snare. Resected and retrieved.                           - The distal rectum and anal verge are normal on                            retroflexion view. Recommendation:           - Discharge patient to home (ambulatory).                           - Await pathology results.                           - Repeat colonoscopy in 5 years for surveillance.                           - The  findings and recommendations were discussed                            with the patient's family.                           - Return to referring physician.                           - Patient has a contact number available for                            emergencies. The signs and symptoms of potential                            delayed complications were discussed with the                            patient. Return to normal activities tomorrow.                            Written discharge instructions were provided to the                            patient. Eugenia Hess, MD 05/22/2024 9:13:05 AM This report has been signed electronically.

## 2024-05-22 NOTE — Progress Notes (Signed)
 To pacu, VSS. Report to [email or web address]

## 2024-05-22 NOTE — Progress Notes (Signed)
 Called to room to assist during endoscopic procedure.  Patient ID and intended procedure confirmed with present staff. Received instructions for my participation in the procedure from the performing physician.

## 2024-05-26 ENCOUNTER — Telehealth: Payer: Self-pay | Admitting: *Deleted

## 2024-05-26 NOTE — Telephone Encounter (Signed)
  Follow up Call-     05/22/2024    8:00 AM  Call back number  Post procedure Call Back phone  # [phone]  Permission to leave phone message Yes     Patient questions:  Do you have a fever, pain , or abdominal swelling? No. Pain Score  0 *  Have you tolerated food without any problems? Yes  Have you been able to return to your normal activities? Yes.    Do you have any questions about your discharge instructions: Diet   No. Medications  No. Follow up visit  No.  Do you have questions or concerns about your Care? No.  Actions: * If pain score is 4 or above: No action needed, pain <4.

## 2024-05-27 ENCOUNTER — Ambulatory Visit: Payer: Self-pay | Admitting: Pediatrics

## 2024-05-27 LAB — SURGICAL PATHOLOGY

## 2024-06-09 ENCOUNTER — Encounter: Payer: Self-pay | Admitting: Internal Medicine

## 2024-06-09 NOTE — Progress Notes (Unsigned)
 Subjective:    Patient ID: Jane Montgomery, female    DOB: 05-20-1950, 74 y.o.   MRN: [id number]      HPI Jane Montgomery is here for a Physical exam and her chronic medical problems.   No concerns.    Medications and allergies reviewed with patient and updated if appropriate.  Current Outpatient Medications on File Prior to Visit  Medication Sig Dispense Refill   acetaminophen  (TYLENOL ) 325 MG tablet Take 650 mg by mouth every 6 (six) hours as needed.     alendronate  (FOSAMAX ) 70 MG tablet TAKE 1 TABLET(70 MG) BY MOUTH EVERY 7 DAYS WITH A FULL GLASS OF WATER AND ON AN EMPTY STOMACH 4 tablet 11   amLODipine  (NORVASC ) 5 MG tablet TAKE 1 TABLET(5 MG) BY MOUTH EVERY MORNING 90 tablet 1   aspirin  81 MG tablet Take 81 mg by mouth daily.     benazepril  (LOTENSIN ) 20 MG tablet TAKE 1 TABLET(20 MG) BY MOUTH DAILY 90 tablet 1   Biotin 10000 MCG TABS Take 1 tablet by mouth daily.     cholecalciferol (VITAMIN D ) 1000 units tablet Take 3,000 Units by mouth daily.     Coenzyme Q10 (CO Q-10) 100 MG CAPS Take 1 capsule by mouth daily.     desloratadine (CLARINEX) 5 MG tablet Take 5 mg by mouth daily.     latanoprost (XALATAN) 0.005 % ophthalmic solution SMARTSIG:1 Drop(s) In Eye(s) Every Evening     montelukast (SINGULAIR) 10 MG tablet Take 10 mg by mouth at bedtime.     Multiple Vitamins-Minerals (CENTRUM SILVER 50+WOMEN) TABS Take 1 tablet by mouth daily.     Multiple Vitamins-Minerals (PRESERVISION AREDS 2 PO) Take 1 capsule by mouth in the morning and at bedtime.     Probiotic Product (PROBIOTIC DAILY PO) Take 1 capsule by mouth daily.     rosuvastatin  (CRESTOR ) 20 MG tablet TAKE 1 TABLET(20 MG) BY MOUTH DAILY 90 tablet 3   sertraline  (ZOLOFT ) 100 MG tablet TAKE 1 TABLET(100 MG) BY MOUTH DAILY 90 tablet 1   triamcinolone ointment (KENALOG) 0.1 % Apply 1 Application topically 2 (two) times daily.     amoxicillin (AMOXIL) 500 MG tablet TK 4 TS PO 1 HOUR PRIOR TO APPOINTMENT (Patient not taking:  Reported on 06/10/2024)  0   Azelastine -Fluticasone  137-50 MCG/ACT SUSP 1 spray in each nostril (Patient not taking: Reported on 06/10/2024)     No current facility-administered medications on file prior to visit.    Review of Systems  Constitutional:  Negative for fever.  Eyes:  Positive for visual disturbance (seeing eye doctor).  Respiratory:  Negative for cough, shortness of breath and wheezing.   Cardiovascular:  Negative for chest pain, palpitations and leg swelling.  Gastrointestinal:  Negative for abdominal pain, blood in stool, constipation and diarrhea.       No gerd  Genitourinary:  Negative for dysuria.  Musculoskeletal:  Positive for arthralgias (knees) and back pain (if stands long periods).  Skin:  Negative for rash.  Neurological:  Negative for light-headedness and headaches.  Psychiatric/Behavioral:  Positive for dysphoric mood (controlled). Negative for sleep disturbance. The patient is not nervous/anxious.        Objective:   Vitals:   06/10/24 1003  BP: 116/78  Pulse: 75  Temp: 98 F (36.7 C)  SpO2: 96%   Filed Weights   06/10/24 1003  Weight: 242 lb (109.8 kg)   Body mass index is 45.73 kg/m.  BP Readings from Last 3 Encounters:  06/10/24 116/78  05/22/24 (!) 109/50  03/10/24 118/62    Wt Readings from Last 3 Encounters:  06/10/24 242 lb (109.8 kg)  05/22/24 240 lb (108.9 kg)  05/08/24 240 lb (108.9 kg)       Physical Exam Constitutional: She appears well-developed and well-nourished. No distress.  HENT:  Head: Normocephalic and atraumatic.  Right Ear: External ear normal. Normal ear canal and TM Left Ear: External ear normal.  Normal ear canal and TM Mouth/Throat: Oropharynx is clear and moist.  Eyes: Conjunctivae normal.  Neck: Neck supple. No tracheal deviation present. No thyromegaly present.  No carotid bruit  Cardiovascular: Normal rate, regular rhythm and normal heart sounds.   No murmur heard.  No edema. Pulmonary/Chest:  Effort normal and breath sounds normal. No respiratory distress. She has no wheezes. She has no rales.  Breast: deferred   Abdominal: Soft. She exhibits no distension. There is no tenderness.  Lymphadenopathy: She has no cervical adenopathy.  Skin: Skin is warm and dry. She is not diaphoretic.  Psychiatric: She has a normal mood and affect. Her behavior is normal.   Diabetic Foot Exam - Simple   Simple Foot Form Diabetic Foot exam was performed with the following findings: Yes 06/10/2024 10:42 AM  Visual Inspection No deformities, no ulcerations, no other skin breakdown bilaterally: Yes Sensation Testing Intact to touch and monofilament testing bilaterally: Yes Pulse Check Posterior Tibialis and Dorsalis pulse intact bilaterally: Yes Comments      Lab Results  Component Value Date   WBC 6.2 06/10/2023   HGB 13.3 06/10/2023   HCT 41.5 06/10/2023   PLT 206.0 06/10/2023   GLUCOSE 122 (H) 12/10/2023   CHOL 174 12/10/2023   TRIG 243.0 (H) 12/10/2023   HDL 63.30 12/10/2023   LDLDIRECT 80.0 06/10/2023   LDLCALC 62 12/10/2023   ALT 38 (H) 12/10/2023   AST 31 12/10/2023   NA 139 12/10/2023   K 4.2 12/10/2023   CL 100 12/10/2023   CREATININE 0.53 12/10/2023   BUN 18 12/10/2023   CO2 28 12/10/2023   TSH 1.23 12/06/2020   INR 0.98 10/04/2015   HGBA1C 6.4 12/10/2023   MICROALBUR 4.4 (H) 06/10/2023         Assessment & Plan:   Physical exam: Screening blood work  ordered Exercise  none Weight  obese Substance abuse  none   Reviewed recommended immunizations.   Health Maintenance  Topic Date Due   Diabetic kidney evaluation - Urine ACR  06/09/2024   HEMOGLOBIN A1C  06/09/2024   INFLUENZA VACCINE  07/31/2024   Diabetic kidney evaluation - eGFR measurement  12/09/2024   OPHTHALMOLOGY EXAM  12/17/2024   Medicare Annual Wellness (AWV)  03/10/2025   FOOT EXAM  06/10/2025   DEXA SCAN  07/16/2025   MAMMOGRAM  10/18/2025   Colonoscopy  05/22/2029   DTaP/Tdap/Td (3 -  Td or Tdap) 06/09/2033   Pneumonia Vaccine 24+ Years old  Completed   Hepatitis C Screening  Completed   Zoster Vaccines- Shingrix  Completed   HPV VACCINES  Aged Out   Meningococcal B Vaccine  Aged Out   COVID-19 Vaccine  Discontinued          See Problem List for Assessment and Plan of chronic medical problems.

## 2024-06-09 NOTE — Patient Instructions (Addendum)
 Blood work was ordered.       Medications changes include :   None    A referral was ordered podiatry - Dr Zettie Hillock and someone will call you to schedule an appointment.     Return in about 6 months (around 12/10/2024) for follow up.   Health Maintenance, Female Adopting a healthy lifestyle and getting preventive care are important in promoting health and wellness. Ask your health care provider about: The right schedule for you to have regular tests and exams. Things you can do on your own to prevent diseases and keep yourself healthy. What should I know about diet, weight, and exercise? Eat a healthy diet  Eat a diet that includes plenty of vegetables, fruits, low-fat dairy products, and lean protein. Do not eat a lot of foods that are high in solid fats, added sugars, or sodium. Maintain a healthy weight Body mass index (BMI) is used to identify weight problems. It estimates body fat based on height and weight. Your health care provider can help determine your BMI and help you achieve or maintain a healthy weight. Get regular exercise Get regular exercise. This is one of the most important things you can do for your health. Most adults should: Exercise for at least 150 minutes each week. The exercise should increase your heart rate and make you sweat (moderate-intensity exercise). Do strengthening exercises at least twice a week. This is in addition to the moderate-intensity exercise. Spend less time sitting. Even light physical activity can be beneficial. Watch cholesterol and blood lipids Have your blood tested for lipids and cholesterol at 72 years of age, then have this test every 5 years. Have your cholesterol levels checked more often if: Your lipid or cholesterol levels are high. You are older than 72 years of age. You are at high risk for heart disease. What should I know about cancer screening? Depending on your health history and family history, you may need  to have cancer screening at various ages. This may include screening for: Breast cancer. Cervical cancer. Colorectal cancer. Skin cancer. Lung cancer. What should I know about heart disease, diabetes, and high blood pressure? Blood pressure and heart disease High blood pressure causes heart disease and increases the risk of stroke. This is more likely to develop in people who have high blood pressure readings or are overweight. Have your blood pressure checked: Every 3-5 years if you are 46-51 years of age. Every year if you are 12 years old or older. Diabetes Have regular diabetes screenings. This checks your fasting blood sugar level. Have the screening done: Once every three years after age 23 if you are at a normal weight and have a low risk for diabetes. More often and at a younger age if you are overweight or have a high risk for diabetes. What should I know about preventing infection? Hepatitis B If you have a higher risk for hepatitis B, you should be screened for this virus. Talk with your health care provider to find out if you are at risk for hepatitis B infection. Hepatitis C Testing is recommended for: Everyone born from [birth year] through 1963. Anyone with known risk factors for hepatitis C. Sexually transmitted infections (STIs) Get screened for STIs, including gonorrhea and chlamydia, if: You are sexually active and are younger than 72 years of age. You are older than 72 years of age and your health care provider tells you that you are at risk for this type of infection.  Your sexual activity has changed since you were last screened, and you are at increased risk for chlamydia or gonorrhea. Ask your health care provider if you are at risk. Ask your health care provider about whether you are at high risk for HIV. Your health care provider may recommend a prescription medicine to help prevent HIV infection. If you choose to take medicine to prevent HIV, you should first get tested  for HIV. You should then be tested every 3 months for as long as you are taking the medicine. Pregnancy If you are about to stop having your period (premenopausal) and you may become pregnant, seek counseling before you get pregnant. Take 400 to 800 micrograms (mcg) of folic acid every day if you become pregnant. Ask for birth control (contraception) if you want to prevent pregnancy. Osteoporosis and menopause Osteoporosis is a disease in which the bones lose minerals and strength with aging. This can result in bone fractures. If you are 65 years old or older, or if you are at risk for osteoporosis and fractures, ask your health care provider if you should: Be screened for bone loss. Take a calcium  or vitamin D  supplement to lower your risk of fractures. Be given hormone replacement therapy (HRT) to treat symptoms of menopause. Follow these instructions at home: Alcohol use Do not drink alcohol if: Your health care provider tells you not to drink. You are pregnant, may be pregnant, or are planning to become pregnant. If you drink alcohol: Limit how much you have to: 0-1 drink a day. Know how much alcohol is in your drink. In the [place] one drink equals one 12 oz bottle of beer (355 mL), one 5 oz glass of wine (148 mL), or one 1 oz glass of hard liquor (44 mL). Lifestyle Do not use any products that contain nicotine or tobacco. These products include cigarettes, chewing tobacco, and vaping devices, such as e-cigarettes. If you need help quitting, ask your health care provider. Do not use street drugs. Do not share needles. Ask your health care provider for help if you need support or information about quitting drugs. General instructions Schedule regular health, dental, and eye exams. Stay current with your vaccines. Tell your health care provider if: You often feel depressed. You have ever been abused or do not feel safe at home. Summary Adopting a healthy lifestyle and getting  preventive care are important in promoting health and wellness. Follow your health care provider's instructions about healthy diet, exercising, and getting tested or screened for diseases. Follow your health care provider's instructions on monitoring your cholesterol and blood pressure. This information is not intended to replace advice given to you by your health care provider. Make sure you discuss any questions you have with your health care provider. Document Revised: 05/08/2021 Document Reviewed: 05/08/2021 Elsevier Patient Education  2024 [employer].

## 2024-06-10 ENCOUNTER — Ambulatory Visit: Payer: Medicare Other | Admitting: Internal Medicine

## 2024-06-10 VITALS — BP 116/78 | HR 75 | Temp 98.0°F | Wt 242.0 lb

## 2024-06-10 DIAGNOSIS — L602 Onychogryphosis: Secondary | ICD-10-CM

## 2024-06-10 DIAGNOSIS — Z0000 Encounter for general adult medical examination without abnormal findings: Secondary | ICD-10-CM | POA: Diagnosis not present

## 2024-06-10 DIAGNOSIS — E7849 Other hyperlipidemia: Secondary | ICD-10-CM

## 2024-06-10 DIAGNOSIS — M810 Age-related osteoporosis without current pathological fracture: Secondary | ICD-10-CM | POA: Diagnosis not present

## 2024-06-10 DIAGNOSIS — F3289 Other specified depressive episodes: Secondary | ICD-10-CM | POA: Diagnosis not present

## 2024-06-10 DIAGNOSIS — E119 Type 2 diabetes mellitus without complications: Secondary | ICD-10-CM

## 2024-06-10 DIAGNOSIS — I10 Essential (primary) hypertension: Secondary | ICD-10-CM | POA: Diagnosis not present

## 2024-06-10 LAB — LIPID PANEL
Cholesterol: 155 mg/dL (ref 0–200)
HDL: 51.5 mg/dL (ref >39.00)
LDL Cholesterol: 55 mg/dL (ref 0–99)
NonHDL: 103.22
Total CHOL/HDL Ratio: 3
Triglycerides: 243 mg/dL — ABNORMAL HIGH (ref 0.0–149.0)
VLDL: 48.6 mg/dL — ABNORMAL HIGH (ref 0.0–40.0)

## 2024-06-10 LAB — MICROALBUMIN / CREATININE URINE RATIO
Creatinine,U: 144.2 mg/dL
Microalb Creat Ratio: 13.8 mg/g (ref 0.0–30.0)
Microalb, Ur: 2 mg/dL — ABNORMAL HIGH (ref 0.0–1.9)

## 2024-06-10 LAB — CBC WITH DIFFERENTIAL/PLATELET
Basophils Absolute: 0 10*3/uL (ref 0.0–0.1)
Basophils Relative: 0.5 % (ref 0.0–3.0)
Eosinophils Absolute: 0.1 10*3/uL (ref 0.0–0.7)
Eosinophils Relative: 1.8 % (ref 0.0–5.0)
HCT: 40.8 % (ref 36.0–46.0)
Hemoglobin: 13.2 g/dL (ref 12.0–15.0)
Lymphocytes Relative: 23.5 % (ref 12.0–46.0)
Lymphs Abs: 1.3 10*3/uL (ref 0.7–4.0)
MCHC: 32.3 g/dL (ref 30.0–36.0)
MCV: 87.6 fl (ref 78.0–100.0)
Monocytes Absolute: 0.3 10*3/uL (ref 0.1–1.0)
Monocytes Relative: 5.5 % (ref 3.0–12.0)
Neutro Abs: 3.7 10*3/uL (ref 1.4–7.7)
Neutrophils Relative %: 68.7 % (ref 43.0–77.0)
Platelets: 210 10*3/uL (ref 150.0–400.0)
RBC: 4.66 Mil/uL (ref 3.87–5.11)
RDW: 14.5 % (ref 11.5–15.5)
WBC: 5.5 10*3/uL (ref 4.0–10.5)

## 2024-06-10 LAB — HEMOGLOBIN A1C: Hgb A1c MFr Bld: 6.4 % (ref 4.6–6.5)

## 2024-06-10 LAB — COMPREHENSIVE METABOLIC PANEL WITH GFR
ALT: 20 U/L (ref 0–35)
AST: 18 U/L (ref 0–37)
Albumin: 4.5 g/dL (ref 3.5–5.2)
Alkaline Phosphatase: 84 U/L (ref 39–117)
BUN: 19 mg/dL (ref 6–23)
CO2: 31 meq/L (ref 19–32)
Calcium: 9.5 mg/dL (ref 8.4–10.5)
Chloride: 101 meq/L (ref 96–112)
Creatinine, Ser: 0.48 mg/dL (ref 0.40–1.20)
GFR: 94.69 mL/min (ref >60.00)
Glucose, Bld: 116 mg/dL — ABNORMAL HIGH (ref 70–99)
Potassium: 4.1 meq/L (ref 3.5–5.1)
Sodium: 140 meq/L (ref 135–145)
Total Bilirubin: 1 mg/dL (ref 0.2–1.2)
Total Protein: 7.1 g/dL (ref 6.0–8.3)

## 2024-06-10 NOTE — Assessment & Plan Note (Signed)
Chronic Controlled, stable Continue  sertraline 100 mg daily 

## 2024-06-10 NOTE — Assessment & Plan Note (Signed)
 Chronic  Lab Results  Component Value Date   HGBA1C 6.4 12/10/2023   Sugars well controlled - does not need medication but could consider medication that may help keep sugars controlled and aid in weight loss - discussed some options Check A1c, urine microalbumin Continue lifestyle control Stressed regular exercise, diabetic diet

## 2024-06-10 NOTE — Assessment & Plan Note (Signed)
 Chronic Blood pressure well controlled CMP, CBC Continue amlodipine  5 mg daily, benazepril  20 mg daily

## 2024-06-10 NOTE — Assessment & Plan Note (Signed)
Chronic Check lipid panel  Continue crestor 20 mg daily Regular exercise and healthy diet encouraged  

## 2024-06-10 NOTE — Assessment & Plan Note (Signed)
 Chronic Dexa up to date Continue Fosamax 70 mg weekly-started 05/2021-plan to continue for 5 years Continue taking calcium and vitamin d daily Encouraged regular exercise Check vitamin D level

## 2024-06-11 ENCOUNTER — Ambulatory Visit: Payer: Self-pay | Admitting: Internal Medicine

## 2024-06-11 LAB — VITAMIN D 25 HYDROXY (VIT D DEFICIENCY, FRACTURES): VITD: 43.21 ng/mL (ref 30.00–100.00)

## 2024-06-22 ENCOUNTER — Ambulatory Visit (INDEPENDENT_AMBULATORY_CARE_PROVIDER_SITE_OTHER): Admitting: Podiatry

## 2024-06-22 ENCOUNTER — Encounter: Payer: Self-pay | Admitting: Podiatry

## 2024-06-22 DIAGNOSIS — B351 Tinea unguium: Secondary | ICD-10-CM | POA: Diagnosis not present

## 2024-06-22 DIAGNOSIS — M79675 Pain in left toe(s): Secondary | ICD-10-CM

## 2024-06-22 DIAGNOSIS — E119 Type 2 diabetes mellitus without complications: Secondary | ICD-10-CM | POA: Diagnosis not present

## 2024-06-22 DIAGNOSIS — M79674 Pain in right toe(s): Secondary | ICD-10-CM

## 2024-06-22 NOTE — Progress Notes (Signed)
 This patient presents to the office with chief complaint of long thick nails and diabetic feet.  This patient  says there  is  no pain and discomfort in her  feet.  This patient says there are long thick painful nails.  These nails are painful walking and wearing shoes.  Patient has no history of infection or drainage from both feet.  Patient is unable to  self treat his own nails . This patient presents  to the office today for treatment of the  long nails and a foot evaluation due to history of  diabetes.  General Appearance  Alert, conversant and in no acute stress.  Vascular  Dorsalis pedis and posterior tibial  pulses are palpable  bilaterally.  Capillary return is within normal limits  bilaterally. Temperature is within normal limits  bilaterally.  Neurologic  Senn-Weinstein monofilament wire test within normal limits  bilaterally. Muscle power within normal limits bilaterally.  Nails Thick disfigured discolored nails with subungual debris  from hallux to fifth toes bilaterally. No evidence of bacterial infection or drainage bilaterally.  Orthopedic  No limitations of motion of motion feet .  No crepitus or effusions noted.  No bony pathology or digital deformities noted. HAV  B/L  Skin  normotropic skin with no porokeratosis noted bilaterally.  No signs of infections or ulcers noted.     Onychomycosis  Diabetes with no foot complications  IE  Debride nails x 10.  A diabetic foot exam was performed and there is no evidence of any vascular or neurologic pathology.   RTC 3 months.   Cordella Bold DPM

## 2024-09-08 ENCOUNTER — Telehealth: Payer: Self-pay | Admitting: Internal Medicine

## 2024-09-08 ENCOUNTER — Other Ambulatory Visit: Payer: Self-pay

## 2024-09-08 MED ORDER — BENAZEPRIL HCL 20 MG PO TABS: 20.0000 mg | ORAL_TABLET | Freq: Every day | ORAL | 1 refills | Status: DC

## 2024-09-08 NOTE — Telephone Encounter (Incomplete)
 Prescription Request  09/08/2024  LOV: 06/10/2024  What is the name of the medication or equipment? Benazaprill andcertriline  Have you contacted your pharmacy to request a refill? {yes/no:[id number]}   Which pharmacy would you like this sent to?  [facility] #[id number] [place], [place] - [address] AT [facility] [address] [place] [address] Phone: [phone] Fax: [phone]    Patient notified that their request is being sent to the clinical staff for review and that they should receive a response within 2 business days.   Please advise at {HOME/MOBILE:[id number]}

## 2024-09-08 NOTE — Telephone Encounter (Signed)
 Sent today

## 2024-09-11 ENCOUNTER — Other Ambulatory Visit: Payer: Self-pay | Admitting: Internal Medicine

## 2024-09-11 MED ORDER — SERTRALINE HCL 100 MG PO TABS: ORAL_TABLET | ORAL | 1 refills | Status: DC

## 2024-09-11 NOTE — Telephone Encounter (Signed)
 Copied from CRM #[id number]. Topic: Clinical - Medication Refill >> Sep 11, 2024  9:55 AM Chiquita SQUIBB wrote: Medication: sertraline  sertraline  (ZOLOFT ) 100 MG tablet   Has the patient contacted their pharmacy? Yes- Pharmacy stated they have sent it many times. (Agent: If no, request that the patient contact the pharmacy for the refill. If patient does not wish to contact the pharmacy document the reason why and proceed with request.) (Agent: If yes, when and what did the pharmacy advise?)  This is the patient's preferred pharmacy:  [facility] #[id number] [place], [place] - [address] AT [facility] [address] [place] [address] Phone: [phone] Fax: [phone]  Is this the correct pharmacy for this prescription? Yes If no, delete pharmacy and type the correct one.   Has the prescription been filled recently? No  Is the patient out of the medication? Yes  Has the patient been seen for an appointment in the last year OR does the patient have an upcoming appointment? Yes  Can we respond through MyChart? Yes  Agent: Please be advised that Rx refills may take up to 3 business days. We ask that you follow-up with your pharmacy.

## 2024-09-21 ENCOUNTER — Encounter: Payer: Self-pay | Admitting: Podiatry

## 2024-09-21 ENCOUNTER — Ambulatory Visit (INDEPENDENT_AMBULATORY_CARE_PROVIDER_SITE_OTHER): Admitting: Podiatry

## 2024-09-21 DIAGNOSIS — E119 Type 2 diabetes mellitus without complications: Secondary | ICD-10-CM

## 2024-09-21 DIAGNOSIS — B351 Tinea unguium: Secondary | ICD-10-CM

## 2024-09-21 DIAGNOSIS — M79675 Pain in left toe(s): Secondary | ICD-10-CM | POA: Diagnosis not present

## 2024-09-21 DIAGNOSIS — M79674 Pain in right toe(s): Secondary | ICD-10-CM | POA: Diagnosis not present

## 2024-09-21 NOTE — Progress Notes (Signed)
 This patient returns to my office for at risk foot care.  This patient requires this care by a professional since this patient will be at risk due to having This patient is unable to cut nails himself since the patient cannot reach his nails.These nails are painful walking and wearing shoes.  This patient presents for at risk foot care today.  General Appearance  Alert, conversant and in no acute stress.  Vascular  Dorsalis pedis and posterior tibial  pulses are palpable  bilaterally.  Capillary return is within normal limits  bilaterally. Temperature is within normal limits  bilaterally.  Neurologic  Senn-Weinstein monofilament wire test within normal limits  bilaterally. Muscle power within normal limits bilaterally.  Nails Thick disfigured discolored nails with subungual debris  from hallux to fifth toes bilaterally. No evidence of bacterial infection or drainage bilaterally.  Orthopedic  No limitations of motion  feet .  No crepitus or effusions noted.  No bony pathology or digital deformities noted.  Skin  normotropic skin with no porokeratosis noted bilaterally.  No signs of infections or ulcers noted.     Onychomycosis  Pain in right toes  Pain in left toes  Consent was obtained for treatment procedures.   Mechanical debridement of nails 1-5  bilaterally performed with a nail nipper.  Filed with dremel without incident.    Return office visit     3 months                Told patient to return for periodic foot care and evaluation due to potential at risk complications.   Helane Gunther DPM

## 2024-10-09 ENCOUNTER — Other Ambulatory Visit: Payer: Self-pay | Admitting: Internal Medicine

## 2024-10-09 ENCOUNTER — Other Ambulatory Visit: Payer: Self-pay

## 2024-10-09 MED ORDER — AMLODIPINE BESYLATE 5 MG PO TABS
5.0000 mg | ORAL_TABLET | Freq: Every day | ORAL | 1 refills | Status: AC
Start: 2024-10-09 — End: ?

## 2024-10-09 NOTE — Telephone Encounter (Unsigned)
 Copied from CRM [id number]. Topic: Clinical - Medication Refill >> Oct 09, 2024 11:05 AM Alfonso ORN wrote: Medication:  amLODipine  (NORVASC ) 5 MG tablet  Has the patient contacted their pharmacy? Yes (Agent: If no, request that the patient contact the pharmacy for the refill. If patient does not wish to contact the pharmacy document the reason why and proceed with request.) (Agent: If yes, when and what did the pharmacy advise?)  This is the patient's preferred pharmacy:  [facility] #[id number] [place], [place] - [address] AT [facility] OF [facility] [address] [place] [address] Phone: [phone] Fax: [phone]  Is this the correct pharmacy for this prescription? Yes  Has the prescription been filled recently? No  Is the patient out of the medication? No, 2 days worth    Has the patient been seen for an appointment in the last year OR does the patient have an upcoming appointment? Yes  Can we respond through MyChart? Yes

## 2024-10-26 NOTE — Progress Notes (Signed)
 [facility] - Clinic Note  11/09/2024     CHIEF COMPLAINT Patient presents for Retina Follow Up    HISTORY OF PRESENT ILLNESS: Jane Montgomery is a 74 y.o. female who presents to the clinic today for:   HPI     Retina Follow Up   Patient presents with  Dry AMD.  In both eyes.  This started 6 months ago.  Duration of months.  Since onset it is gradually worsening.  I, the attending physician,  performed the HPI with the patient and updated documentation appropriately.        Comments   Pt states she is seeing better DVA without her glasses and typically only needs them for reading. Pt states OD is her bad eye. Pt has been taking new eye vitamin for the past week with Lutein, Billberry, and zeaxanthin. Pt woke up on Friday with her LUL bruised-thinks it may have been from the vitamin. Pt denies FOL/floaters/pain.      Last edited by Valdemar Rogue, MD on 11/14/2024  8:48 PM.     Patient states she has not noticed a change in her vision. She currently has a black eye, no pain associate with it. She started a new eye vitamin in addition to AREDS.   Referring physician: Geofm Glade PARAS, MD [address] [place],  [place] [address]  HISTORICAL INFORMATION:   Selected notes from the MEDICAL RECORD NUMBER Referred by Dr. Lelon for eval of GA paracentral mac   CURRENT MEDICATIONS: Current Outpatient Medications (Ophthalmic Drugs)  Medication Sig   latanoprost (XALATAN) 0.005 % ophthalmic solution SMARTSIG:1 Drop(s) In Eye(s) Every Evening   No current facility-administered medications for this visit. (Ophthalmic Drugs)   Current Outpatient Medications (Other)  Medication Sig   acetaminophen  (TYLENOL ) 325 MG tablet Take 650 mg by mouth every 6 (six) hours as needed.   alendronate  (FOSAMAX ) 70 MG tablet TAKE 1 TABLET(70 MG) BY MOUTH EVERY 7 DAYS WITH A FULL GLASS OF WATER AND ON AN EMPTY STOMACH   amLODipine  (NORVASC ) 5 MG tablet Take 1 tablet (5 mg total)  by mouth daily.   amoxicillin (AMOXIL) 500 MG tablet TK 4 TS PO 1 HOUR PRIOR TO APPOINTMENT   aspirin  81 MG tablet Take 81 mg by mouth daily.   Azelastine -Fluticasone  137-50 MCG/ACT SUSP 1 spray in each nostril   benazepril  (LOTENSIN ) 20 MG tablet Take 1 tablet (20 mg total) by mouth daily.   Biotin 89999 MCG TABS Take 1 tablet by mouth daily.   cholecalciferol (VITAMIN D ) 1000 units tablet Take 3,000 Units by mouth daily.   Coenzyme Q10 (CO Q-10) 100 MG CAPS Take 1 capsule by mouth daily.   desloratadine (CLARINEX) 5 MG tablet Take 5 mg by mouth daily.   montelukast (SINGULAIR) 10 MG tablet Take 10 mg by mouth at bedtime.   Multiple Vitamins-Minerals (CENTRUM SILVER 50+WOMEN) TABS Take 1 tablet by mouth daily.   Multiple Vitamins-Minerals (PRESERVISION AREDS 2 PO) Take 1 capsule by mouth in the morning and at bedtime.   Probiotic Product (PROBIOTIC DAILY PO) Take 1 capsule by mouth daily.   rosuvastatin  (CRESTOR ) 20 MG tablet TAKE 1 TABLET(20 MG) BY MOUTH DAILY   sertraline  (ZOLOFT ) 100 MG tablet TAKE 1 TABLET(100 MG) BY MOUTH DAILY   triamcinolone ointment (KENALOG) 0.1 % Apply 1 Application topically 2 (two) times daily.   No current facility-administered medications for this visit. (Other)   REVIEW OF SYSTEMS: ROS   Positive for: Gastrointestinal,  Neurological, Musculoskeletal, Endocrine, Cardiovascular, Eyes, Respiratory Negative for: Constitutional, Skin, Genitourinary, HENT, Psychiatric, Allergic/Imm, Heme/Lymph Last edited by Elnor Avelina RAMAN, COT on 11/09/2024  1:02 PM.       ALLERGIES No Known Allergies  PAST MEDICAL HISTORY Past Medical History:  Diagnosis Date   Allergy    Anisocoria    evaluated by Dr Maurice 2005   Arthritis    Benign positional vertigo    in LLDP   Chronic back pain    reason unknown    Depression    takes Zoloft  daily- hormone imbalance - not truly depression per pt    Diarrhea    Diverticulosis    Gestational diabetes 1982, 1984   pre    Gilbert syndrome    Heart murmur    Hyperlipidemia    takes Pravastatin  daily   Hypertension    takes Amlodipine  daily as well as Benazepril    Hypertensive retinopathy    Joint pain    Macular degeneration    early stages and dry   Nocturia    Osteoporosis    Shortness of breath dyspnea    allergy related and occasionally will take Claritin .   Sleep apnea    has cpap   Past Surgical History:  Procedure Laterality Date   CATARACT EXTRACTION Bilateral 12/31/2013   CESAREAN SECTION     x 2   COLONOSCOPY  [phone]   Negative, redundant colon; [facility]   DILATION AND CURETTAGE OF UTERUS     Dr Paola   EYE SURGERY     PLANTAR FASCIA SURGERY Bilateral    TONSILLECTOMY     TOTAL SHOULDER ARTHROPLASTY Right 10/13/2015   Procedure: TOTAL SHOULDER ARTHROPLASTY;  Surgeon: Eva Herring, MD;  Location: [facility] OR;  Service: Orthopedics;  Laterality: Right;  Right total shoulder arthroplasty    FAMILY HISTORY Family History  Problem Relation Age of Onset   Hypertension Father    Heart attack Father 56       CABG    Colon cancer Mother 84   Macular degeneration Mother    Hypertension Mother    Seizures Mother        ? etiology (preceded CVA)   Stroke Mother 4   Osteoporosis Mother    Myelodysplastic syndrome Mother    Colon cancer Maternal Grandmother    Colon cancer Maternal Aunt    Colon cancer Other        2 Maternal Great Aunts   Heart attack Paternal Grandfather        > 55   Heart attack Paternal Aunt 51   Leukemia Paternal Grandmother    Diabetes Neg Hx    Colon polyps Neg Hx    Esophageal cancer Neg Hx    Rectal cancer Neg Hx    Stomach cancer Neg Hx    SOCIAL HISTORY Social History   Tobacco Use   Smoking status: Never    Passive exposure: Never   Smokeless tobacco: Never  Vaping Use   Vaping status: Never Used  Substance Use Topics   Alcohol use: No   Drug use: No       OPHTHALMIC EXAM: Base Eye Exam     Visual Acuity (Snellen -  Linear)       Right Left   Dist cc 20/150 +2 20/40   Dist ph cc NI NI    Correction: Glasses         Tonometry (Tonopen, 1:17 PM)       Right Left  Pressure 14 16         Pupils       Pupils Dark Light Shape React APD   Right PERRL 3 2 Round Brisk None   Left PERRL 3 2 Round Brisk None         Visual Fields       Left Right    Full Full         Extraocular Movement       Right Left    Full, Ortho Full, Ortho         Neuro/Psych     Oriented x3: Yes   Mood/Affect: Normal         Dilation     Both eyes: 1.0% Mydriacyl, 2.5% Phenylephrine  @ 1:17 PM           Slit Lamp and Fundus Exam     Slit Lamp Exam       Right Left   Lids/Lashes Dermatochalasis - upper lid, Meibomian gland dysfunction Dermatochalasis - upper lid   Conjunctiva/Sclera White and quiet White and quiet   Cornea arcus, well healed cataract wounds, trace inferior Punctate epithelial erosions arcus, well healed cataract wounds, 1+ inferior Punctate epithelial erosions   Anterior Chamber Deep and quiet Deep and quiet   Iris Round and moderately dilated Round and dilated, +PPM   Lens PC IOL in good position with open PC PC IOL in good position   Anterior Vitreous Vitreous syneresis, Posterior vitreous detachment, mild vitreous condensations Vitreous syneresis, Posterior vitreous detachment, vitreous condensations         Fundus Exam       Right Left   Disc mild Pallor, Sharp rim, mild PPA mild Pallor, Sharp rim, mild PPA   C/D Ratio 0.5 0.6   Macula Flat, Blunted foveal reflex, RPE mottling and clumping, Drusen, patches of atrophy increased, No heme or edema Flat, Blunted foveal reflex, RPE mottling and clumping, diffuse early atrophy, scattered patches of GA-- slightly increased, No heme or edema   Vessels attenuated, mild tortuosity attenuated, mild tortuosity   Periphery Attached, scattered pavingstone degeneration, mild reticular degeneration, No heme Attached, scattered  pavingstone degeneration, mild reticular degeneration, No heme           Refraction     Wearing Rx       Sphere Cylinder Axis Add   Right -2.25 +1.50 012 +2.50   Left +0.25 +0.25 146 +2.50    Age: 36 months   Type: Progressive         Manifest Refraction       Sphere   Right no improvements   Left   Pt wanted less sphere and cylinder but did not improve VA results.          IMAGING AND PROCEDURES  Imaging and Procedures for 11/09/2024  OCT, Retina - OU - Both Eyes       Right Eye Quality was good. Central Foveal Thickness: 284. Progression has worsened. Findings include normal foveal contour, no IRF, no SRF, retinal drusen , outer retinal atrophy (Diffuse ORA, significant interval progression of GA on en face image).   Left Eye Quality was good. Central Foveal Thickness: 255. Progression has worsened. Findings include normal foveal contour, no IRF, no SRF, retinal drusen , outer retinal atrophy (Diffuse ORA, stable improvement in central cystic changes, mild interval increase in patches of GA on en face images).   Notes *Images captured and stored on drive  Diagnosis / Impression:  Non-exudative ARMD OU  OD: Diffuse ORA, significant interval progression of GA on en face image OS: Diffuse ORA, stable improvement in central cystic changes, mild interval increase in patches of GA on en face images  Clinical management:  See below  Abbreviations: NFP - Normal foveal profile. CME - cystoid macular edema. PED - pigment epithelial detachment. IRF - intraretinal fluid. SRF - subretinal fluid. EZ - ellipsoid zone. ERM - epiretinal membrane. ORA - outer retinal atrophy. ORT - outer retinal tubulation. SRHM - subretinal hyper-reflective material. IRHM - intraretinal hyper-reflective material            ASSESSMENT/PLAN:    ICD-10-CM   1. 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 Essential hypertension  I10     4. Hypertensive retinopathy of both eyes  H35.033     5. Pseudophakia, both eyes  Z96.1      1,2. 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 Hypertensive retinopathy OU - discussed importance of tight BP control - continue to monitor  5. Pseudophakia OU  - s/p CE/IOL OU  - IOsL in good position, doing well  - continue to monitor   Ophthalmic Meds Ordered this visit:  No orders of the defined types were placed in this encounter.    Return in about 6 months (around 05/09/2025) for f/u, Non Ex. AMD, DFE, OCT.  There are no Patient Instructions on file for this visit.  This document serves as a record of services personally performed by Redell JUDITHANN Hans, MD, PhD. It was created on their behalf by Avelina Pereyra, COA an ophthalmic technician. The creation of this record is the provider's dictation and/or activities during  the visit.   Electronically signed by: Avelina GORMAN Pereyra, COT  11/14/24  8:50 PM   This document serves as a record of services personally performed by Redell JUDITHANN Hans, MD, PhD. It was created on their behalf by Wanda GEANNIE Keens, COT an ophthalmic technician. The creation of this record is the provider's dictation and/or activities during the visit.    Electronically signed by:  Wanda GEANNIE Keens, COT  11/14/24 8:50 PM  Redell JUDITHANN Hans, M.D., Ph.D. Diseases & Surgery of the Retina and Vitreous [facility]  I have reviewed the above documentation for accuracy and completeness, and I agree with the above. Redell JUDITHANN Hans, M.D., Ph.D. 11/14/24 8:52 PM    Abbreviations: CHRISTELLA  myopia (nearsighted); A astigmatism; H hyperopia (farsighted); P presbyopia; Mrx spectacle prescription;  CTL contact lenses; OD right eye; OS left eye; OU both eyes  XT exotropia; ET esotropia; PEK punctate epithelial keratitis; PEE punctate epithelial erosions; DES dry eye syndrome; MGD meibomian gland dysfunction; ATs artificial tears; PFAT's preservative free artificial tears; NSC nuclear sclerotic cataract; PSC posterior subcapsular cataract; ERM epi-retinal membrane; PVD posterior vitreous detachment; RD retinal detachment; DM diabetes mellitus; DR diabetic retinopathy; NPDR non-proliferative diabetic retinopathy; PDR proliferative diabetic retinopathy; CSME clinically significant macular edema; DME diabetic macular edema; dbh dot blot hemorrhages; CWS cotton wool spot; POAG primary open angle glaucoma; C/D cup-to-disc ratio; HVF humphrey visual field; GVF goldmann visual field; OCT optical coherence tomography; IOP intraocular pressure; BRVO Branch retinal vein occlusion; CRVO central retinal vein occlusion; CRAO central retinal artery occlusion; BRAO branch retinal artery occlusion; RT retinal tear; SB scleral buckle; PPV pars plana vitrectomy; VH Vitreous hemorrhage; PRP panretinal laser  photocoagulation; IVK intravitreal kenalog; VMT vitreomacular traction; MH Macular hole;  NVD neovascularization of the disc; NVE neovascularization elsewhere; AREDS age related eye disease study; ARMD age related macular degeneration; POAG primary open angle glaucoma; EBMD epithelial/anterior basement membrane dystrophy; ACIOL anterior chamber intraocular lens; IOL intraocular lens; PCIOL posterior chamber intraocular lens; Phaco/IOL phacoemulsification with intraocular lens placement; PRK photorefractive keratectomy; LASIK laser assisted in situ keratomileusis; HTN hypertension; DM diabetes mellitus; COPD chronic obstructive pulmonary disease

## 2024-10-31 LAB — HM MAMMOGRAPHY

## 2024-11-03 ENCOUNTER — Encounter: Payer: Self-pay | Admitting: Internal Medicine

## 2024-11-09 ENCOUNTER — Encounter (INDEPENDENT_AMBULATORY_CARE_PROVIDER_SITE_OTHER): Payer: Self-pay | Admitting: Ophthalmology

## 2024-11-09 ENCOUNTER — Ambulatory Visit (INDEPENDENT_AMBULATORY_CARE_PROVIDER_SITE_OTHER): Admitting: Ophthalmology

## 2024-11-09 DIAGNOSIS — H35383 Toxic maculopathy, bilateral: Secondary | ICD-10-CM | POA: Diagnosis not present

## 2024-11-09 DIAGNOSIS — H353133 Nonexudative age-related macular degeneration, bilateral, advanced atrophic without subfoveal involvement: Secondary | ICD-10-CM

## 2024-11-09 DIAGNOSIS — H35033 Hypertensive retinopathy, bilateral: Secondary | ICD-10-CM

## 2024-11-09 DIAGNOSIS — I10 Essential (primary) hypertension: Secondary | ICD-10-CM | POA: Diagnosis not present

## 2024-11-09 DIAGNOSIS — Z961 Presence of intraocular lens: Secondary | ICD-10-CM

## 2024-11-12 ENCOUNTER — Other Ambulatory Visit: Payer: Self-pay | Admitting: Internal Medicine

## 2024-11-14 ENCOUNTER — Encounter (INDEPENDENT_AMBULATORY_CARE_PROVIDER_SITE_OTHER): Payer: Self-pay | Admitting: Ophthalmology

## 2024-12-09 NOTE — Patient Instructions (Addendum)
° ° °  Flu immunization administered today.      Blood work was ordered.       Medications changes include :   None      Return in about 6 months (around 06/10/2025) for Physical Exam.

## 2024-12-09 NOTE — Progress Notes (Signed)
 Subjective:    Patient ID: Jane BESECKER, female    DOB: 1950/01/12, 74 y.o.   MRN: [id number]     HPI Jane Montgomery is here for follow up of her chronic medical problems.  Having increased pain from arthritis.    Has been out of statin x 2 weeks.   Medications and allergies reviewed with patient and updated if appropriate.  Current Outpatient Medications on File Prior to Visit  Medication Sig Dispense Refill   acetaminophen  (TYLENOL ) 325 MG tablet Take 650 mg by mouth every 6 (six) hours as needed.     amoxicillin (AMOXIL) 500 MG tablet TK 4 TS PO 1 HOUR PRIOR TO APPOINTMENT  0   aspirin  81 MG tablet Take 81 mg by mouth daily.     Azelastine -Fluticasone  137-50 MCG/ACT SUSP 1 spray in each nostril     Biotin 89999 MCG TABS Take 1 tablet by mouth daily.     cholecalciferol (VITAMIN D ) 1000 units tablet Take 3,000 Units by mouth daily.     Coenzyme Q10 (CO Q-10) 100 MG CAPS Take 1 capsule by mouth daily.     desloratadine (CLARINEX) 5 MG tablet Take 5 mg by mouth daily.     latanoprost (XALATAN) 0.005 % ophthalmic solution SMARTSIG:1 Drop(s) In Eye(s) Every Evening     montelukast (SINGULAIR) 10 MG tablet Take 10 mg by mouth at bedtime.     Multiple Vitamins-Minerals (CENTRUM SILVER 50+WOMEN) TABS Take 1 tablet by mouth daily.     Multiple Vitamins-Minerals (PRESERVISION AREDS 2 PO) Take 1 capsule by mouth in the morning and at bedtime.     Probiotic Product (PROBIOTIC DAILY PO) Take 1 capsule by mouth daily.     triamcinolone ointment (KENALOG) 0.1 % Apply 1 Application topically 2 (two) times daily.     No current facility-administered medications on file prior to visit.     Review of Systems  Constitutional:  Negative for fever.  Respiratory:  Negative for cough, shortness of breath and wheezing.   Cardiovascular:  Negative for chest pain, palpitations and leg swelling.  Musculoskeletal:  Positive for arthralgias.  Neurological:  Negative for light-headedness and headaches.        Objective:   Vitals:   12/10/24 1015  BP: 118/70  Pulse: 66  Temp: 98.3 F (36.8 C)  SpO2: 97%   BP Readings from Last 3 Encounters:  12/10/24 118/70  06/10/24 116/78  05/22/24 (!) 109/50   Wt Readings from Last 3 Encounters:  12/10/24 232 lb (105.2 kg)  06/10/24 242 lb (109.8 kg)  05/22/24 240 lb (108.9 kg)   Body mass index is 43.84 kg/m.    Physical Exam Constitutional:      General: She is not in acute distress.    Appearance: Normal appearance.  HENT:     Head: Normocephalic and atraumatic.  Eyes:     Conjunctiva/sclera: Conjunctivae normal.  Cardiovascular:     Rate and Rhythm: Normal rate and regular rhythm.     Heart sounds: Normal heart sounds.  Pulmonary:     Effort: Pulmonary effort is normal. No respiratory distress.     Breath sounds: Normal breath sounds. No wheezing.  Musculoskeletal:     Cervical back: Neck supple.     Right lower leg: No edema.     Left lower leg: No edema.  Lymphadenopathy:     Cervical: No cervical adenopathy.  Skin:    General: Skin is warm and dry.     Findings:  No rash.  Neurological:     Mental Status: She is alert. Mental status is at baseline.  Psychiatric:        Mood and Affect: Mood normal.        Behavior: Behavior normal.        Lab Results  Component Value Date   WBC 5.5 06/10/2024   HGB 13.2 06/10/2024   HCT 40.8 06/10/2024   PLT 210.0 06/10/2024   GLUCOSE 116 (H) 06/10/2024   CHOL 155 06/10/2024   TRIG 243.0 (H) 06/10/2024   HDL 51.50 06/10/2024   LDLDIRECT 80.0 06/10/2023   LDLCALC 55 06/10/2024   ALT 20 06/10/2024   AST 18 06/10/2024   NA 140 06/10/2024   K 4.1 06/10/2024   CL 101 06/10/2024   CREATININE 0.48 06/10/2024   BUN 19 06/10/2024   CO2 31 06/10/2024   TSH 1.23 12/06/2020   INR 0.98 10/04/2015   HGBA1C 6.4 06/10/2024   MICROALBUR 2.0 (H) 06/10/2024     Assessment & Plan:    See Problem List for Assessment and Plan of chronic medical problems.     Flu  immunization administered today.

## 2024-12-10 ENCOUNTER — Other Ambulatory Visit (HOSPITAL_COMMUNITY): Payer: Self-pay

## 2024-12-10 ENCOUNTER — Ambulatory Visit: Payer: Self-pay | Admitting: Internal Medicine

## 2024-12-10 ENCOUNTER — Ambulatory Visit: Admitting: Internal Medicine

## 2024-12-10 ENCOUNTER — Encounter: Payer: Self-pay | Admitting: Internal Medicine

## 2024-12-10 VITALS — BP 118/70 | HR 66 | Temp 98.3°F | Ht 61.0 in | Wt 232.0 lb

## 2024-12-10 DIAGNOSIS — Z23 Encounter for immunization: Secondary | ICD-10-CM

## 2024-12-10 DIAGNOSIS — I152 Hypertension secondary to endocrine disorders: Secondary | ICD-10-CM

## 2024-12-10 DIAGNOSIS — F3289 Other specified depressive episodes: Secondary | ICD-10-CM

## 2024-12-10 DIAGNOSIS — E559 Vitamin D deficiency, unspecified: Secondary | ICD-10-CM

## 2024-12-10 DIAGNOSIS — M810 Age-related osteoporosis without current pathological fracture: Secondary | ICD-10-CM

## 2024-12-10 DIAGNOSIS — E1169 Type 2 diabetes mellitus with other specified complication: Secondary | ICD-10-CM

## 2024-12-10 LAB — LIPID PANEL
Cholesterol: 247 mg/dL — ABNORMAL HIGH (ref 0–200)
HDL: 60 mg/dL (ref >39.00)
LDL Cholesterol: 120 mg/dL — ABNORMAL HIGH (ref 0–99)
NonHDL: 186.86
Total CHOL/HDL Ratio: 4
Triglycerides: 336 mg/dL — ABNORMAL HIGH (ref 0.0–149.0)
VLDL: 67.2 mg/dL — ABNORMAL HIGH (ref 0.0–40.0)

## 2024-12-10 LAB — CBC WITH DIFFERENTIAL/PLATELET
Basophils Absolute: 0 K/uL (ref 0.0–0.1)
Basophils Relative: 0.6 % (ref 0.0–3.0)
Eosinophils Absolute: 0.1 K/uL (ref 0.0–0.7)
Eosinophils Relative: 2.4 % (ref 0.0–5.0)
HCT: 40.2 % (ref 36.0–46.0)
Hemoglobin: 13.3 g/dL (ref 12.0–15.0)
Lymphocytes Relative: 24.7 % (ref 12.0–46.0)
Lymphs Abs: 1.5 K/uL (ref 0.7–4.0)
MCHC: 33 g/dL (ref 30.0–36.0)
MCV: 89.2 fl (ref 78.0–100.0)
Monocytes Absolute: 0.3 K/uL (ref 0.1–1.0)
Monocytes Relative: 5.2 % (ref 3.0–12.0)
Neutro Abs: 4 K/uL (ref 1.4–7.7)
Neutrophils Relative %: 67.1 % (ref 43.0–77.0)
Platelets: 219 K/uL (ref 150.0–400.0)
RBC: 4.51 Mil/uL (ref 3.87–5.11)
RDW: 14.4 % (ref 11.5–15.5)
WBC: 5.9 K/uL (ref 4.0–10.5)

## 2024-12-10 LAB — COMPREHENSIVE METABOLIC PANEL WITH GFR
ALT: 31 U/L (ref 0–35)
AST: 29 U/L (ref 0–37)
Albumin: 4.7 g/dL (ref 3.5–5.2)
Alkaline Phosphatase: 91 U/L (ref 39–117)
BUN: 15 mg/dL (ref 6–23)
CO2: 31 meq/L (ref 19–32)
Calcium: 10 mg/dL (ref 8.4–10.5)
Chloride: 100 meq/L (ref 96–112)
Creatinine, Ser: 0.49 mg/dL (ref 0.40–1.20)
GFR: 93.89 mL/min (ref >60.00)
Glucose, Bld: 112 mg/dL — ABNORMAL HIGH (ref 70–99)
Potassium: 4.7 meq/L (ref 3.5–5.1)
Sodium: 139 meq/L (ref 135–145)
Total Bilirubin: 0.9 mg/dL (ref 0.2–1.2)
Total Protein: 7.7 g/dL (ref 6.0–8.3)

## 2024-12-10 LAB — VITAMIN D 25 HYDROXY (VIT D DEFICIENCY, FRACTURES): VITD: 39.54 ng/mL (ref 30.00–100.00)

## 2024-12-10 LAB — HEMOGLOBIN A1C: Hgb A1c MFr Bld: 6 % (ref 4.6–6.5)

## 2024-12-10 MED ORDER — AMLODIPINE BESYLATE 5 MG PO TABS
5.0000 mg | ORAL_TABLET | Freq: Every day | ORAL | 1 refills | Status: DC
  Filled 2024-12-10: qty 90, 90d supply, fill #0

## 2024-12-10 MED ORDER — ALENDRONATE SODIUM 70 MG PO TABS
ORAL_TABLET | ORAL | 3 refills | Status: AC
  Filled 2024-12-10: qty 12, 84d supply, fill #0

## 2024-12-10 MED ORDER — SERTRALINE HCL 100 MG PO TABS
ORAL_TABLET | ORAL | 1 refills | Status: AC
  Filled 2024-12-10: qty 90, 90d supply, fill #0

## 2024-12-10 MED ORDER — ROSUVASTATIN CALCIUM 20 MG PO TABS
ORAL_TABLET | ORAL | 3 refills | Status: AC
  Filled 2024-12-10: qty 90, 90d supply, fill #0

## 2024-12-10 MED ORDER — BENAZEPRIL HCL 20 MG PO TABS
20.0000 mg | ORAL_TABLET | Freq: Every day | ORAL | 1 refills | Status: AC
  Filled 2024-12-10: qty 90, 90d supply, fill #0

## 2024-12-10 NOTE — Assessment & Plan Note (Signed)
 Chronic Taking vitamin d daily Check vitamin d level

## 2024-12-10 NOTE — Assessment & Plan Note (Signed)
 Chronic Dexa up to date Continue Fosamax 70 mg weekly-started 05/2021-plan to continue for 5 years Continue taking calcium and vitamin d daily Encouraged regular exercise Check vitamin D level

## 2024-12-10 NOTE — Assessment & Plan Note (Signed)
Chronic Controlled, stable Continue  sertraline 100 mg daily 

## 2024-12-10 NOTE — Assessment & Plan Note (Addendum)
 Chronic Check lipid panel, cmp Has been out of the statin for 2 weeks Continue crestor  20 mg daily Regular exercise and healthy diet encouraged

## 2024-12-10 NOTE — Assessment & Plan Note (Signed)
 Chronic Blood pressure well controlled CMP, CBC Continue amlodipine  5 mg daily, benazepril  20 mg daily

## 2024-12-10 NOTE — Assessment & Plan Note (Signed)
 Chronic Associated with hyperlipidemia, hypertension Lab Results  Component Value Date   HGBA1C 6.4 06/10/2024   Sugars well controlled Check A1c Continue lifestyle control Stressed regular exercise, diabetic diet

## 2024-12-21 ENCOUNTER — Encounter: Payer: Self-pay | Admitting: Podiatry

## 2024-12-21 ENCOUNTER — Ambulatory Visit: Admitting: Podiatry

## 2024-12-21 DIAGNOSIS — B351 Tinea unguium: Secondary | ICD-10-CM | POA: Diagnosis not present

## 2024-12-21 DIAGNOSIS — E119 Type 2 diabetes mellitus without complications: Secondary | ICD-10-CM | POA: Diagnosis not present

## 2024-12-21 DIAGNOSIS — M79674 Pain in right toe(s): Secondary | ICD-10-CM | POA: Diagnosis not present

## 2024-12-21 DIAGNOSIS — M79675 Pain in left toe(s): Secondary | ICD-10-CM

## 2024-12-21 NOTE — Progress Notes (Signed)
 This patient returns to my office for at risk foot care.  This patient requires this care by a professional since this patient will be at risk due to having This patient is unable to cut nails himself since the patient cannot reach his nails.These nails are painful walking and wearing shoes.  This patient presents for at risk foot care today.  General Appearance  Alert, conversant and in no acute stress.  Vascular  Dorsalis pedis and posterior tibial  pulses are palpable  bilaterally.  Capillary return is within normal limits  bilaterally. Temperature is within normal limits  bilaterally.  Neurologic  Senn-Weinstein monofilament wire test within normal limits  bilaterally. Muscle power within normal limits bilaterally.  Nails Thick disfigured discolored nails with subungual debris  from hallux to fifth toes bilaterally. No evidence of bacterial infection or drainage bilaterally.  Orthopedic  No limitations of motion  feet .  No crepitus or effusions noted.  No bony pathology or digital deformities noted.  Skin  normotropic skin with no porokeratosis noted bilaterally.  No signs of infections or ulcers noted.     Onychomycosis  Pain in right toes  Pain in left toes  Consent was obtained for treatment procedures.   Mechanical debridement of nails 1-5  bilaterally performed with a nail nipper.  Filed with dremel without incident.    Return office visit     3 months                Told patient to return for periodic foot care and evaluation due to potential at risk complications.   Helane Gunther DPM

## 2025-01-05 ENCOUNTER — Other Ambulatory Visit: Payer: Self-pay | Admitting: Internal Medicine

## 2025-01-05 ENCOUNTER — Other Ambulatory Visit (HOSPITAL_COMMUNITY): Payer: Self-pay

## 2025-01-05 MED ORDER — MONTELUKAST SODIUM 10 MG PO TABS
10.0000 mg | ORAL_TABLET | Freq: Every day | ORAL | 5 refills | Status: AC
  Filled 2025-01-05: qty 30, 30d supply, fill #0
  Filled 2025-02-03: qty 30, 30d supply, fill #1

## 2025-01-05 MED ORDER — LEVOCETIRIZINE DIHYDROCHLORIDE 5 MG PO TABS
5.0000 mg | ORAL_TABLET | Freq: Every day | ORAL | 5 refills | Status: AC
  Filled 2025-01-05: qty 30, 30d supply, fill #0
  Filled 2025-02-03: qty 30, 30d supply, fill #1

## 2025-01-05 NOTE — Telephone Encounter (Unsigned)
 Copied from CRM [id number]. Topic: Clinical - Medication Refill >> Jan 05, 2025 10:55 AM Thersia BROCKS wrote: Medication: amLODipine  (NORVASC ) 5 MG tablet  Has the patient contacted their pharmacy? Yes (Agent: If no, request that the patient contact the pharmacy for the refill. If patient does not wish to contact the pharmacy document the reason why and proceed with request.) (Agent: If yes, when and what did the pharmacy advise?)  This is the patient's preferred pharmacy:  [place] [facility] [address] [place] [address] Phone: [phone] Fax: [phone]  Is this the correct pharmacy for this prescription? Yes If no, delete pharmacy and type the correct one.   Has the prescription been filled recently? No  Is the patient out of the medication? Yes  Has the patient been seen for an appointment in the last year OR does the patient have an upcoming appointment? Yes  Can we respond through MyChart? Yes  Agent: Please be advised that Rx refills may take up to 3 business days. We ask that you follow-up with your pharmacy.

## 2025-01-06 ENCOUNTER — Other Ambulatory Visit: Payer: Self-pay

## 2025-01-06 ENCOUNTER — Other Ambulatory Visit (HOSPITAL_COMMUNITY): Payer: Self-pay

## 2025-01-06 MED ORDER — AMLODIPINE BESYLATE 5 MG PO TABS
5.0000 mg | ORAL_TABLET | Freq: Every day | ORAL | 1 refills | Status: AC
  Filled 2025-01-06: qty 90, 90d supply, fill #0

## 2025-01-11 LAB — OPHTHALMOLOGY REPORT-SCANNED

## 2025-02-03 ENCOUNTER — Other Ambulatory Visit: Payer: Self-pay

## 2025-03-22 ENCOUNTER — Ambulatory Visit: Admitting: Podiatry

## 2025-05-10 ENCOUNTER — Encounter (INDEPENDENT_AMBULATORY_CARE_PROVIDER_SITE_OTHER): Admitting: Ophthalmology

## 2025-05-12 ENCOUNTER — Ambulatory Visit

## 2025-06-10 ENCOUNTER — Ambulatory Visit: Admitting: Internal Medicine
